# Patient Record
Sex: Female | Born: 1980 | Race: Black or African American | Hispanic: No | Marital: Married | Smoking: Never smoker
Health system: Southern US, Community
[De-identification: ages and names within clinical notes are randomized; demographics above are authoritative.]

## PROBLEM LIST (undated history)

## (undated) ENCOUNTER — Inpatient Hospital Stay (HOSPITAL_COMMUNITY): Payer: Self-pay

## (undated) DIAGNOSIS — G43909 Migraine, unspecified, not intractable, without status migrainosus: Secondary | ICD-10-CM

## (undated) DIAGNOSIS — D259 Leiomyoma of uterus, unspecified: Secondary | ICD-10-CM

## (undated) DIAGNOSIS — IMO0002 Reserved for concepts with insufficient information to code with codable children: Secondary | ICD-10-CM

## (undated) DIAGNOSIS — O3410 Maternal care for benign tumor of corpus uteri, unspecified trimester: Secondary | ICD-10-CM

## (undated) DIAGNOSIS — I2699 Other pulmonary embolism without acute cor pulmonale: Secondary | ICD-10-CM

## (undated) DIAGNOSIS — Z01818 Encounter for other preprocedural examination: Secondary | ICD-10-CM

## (undated) DIAGNOSIS — K8020 Calculus of gallbladder without cholecystitis without obstruction: Secondary | ICD-10-CM

## (undated) DIAGNOSIS — D649 Anemia, unspecified: Secondary | ICD-10-CM

## (undated) DIAGNOSIS — G35D Multiple sclerosis, unspecified: Secondary | ICD-10-CM

## (undated) HISTORY — PX: BLADDER SUSPENSION: SHX72

## (undated) HISTORY — PX: OTHER SURGICAL HISTORY: SHX169

## (undated) HISTORY — PX: TOTAL ABDOMINAL HYSTERECTOMY: SHX209

## (undated) HISTORY — PX: WISDOM TOOTH EXTRACTION: SHX21

## (undated) HISTORY — PX: TUBAL LIGATION: SHX77

## (undated) HISTORY — PX: ABLATION: SHX5711

## (undated) HISTORY — PX: DIAGNOSTIC LAPAROSCOPY: SUR761

## (undated) HISTORY — PX: UPPER GI ENDOSCOPY: SHX6162

## (undated) HISTORY — PX: CHOLECYSTECTOMY: SHX55

## (undated) HISTORY — PX: PELVIC LAPAROSCOPY: SHX162

## (undated) HISTORY — PX: MYOMECTOMY: SHX85

## (undated) HISTORY — DX: Migraine, unspecified, not intractable, without status migrainosus: G43.909

## (undated) HISTORY — PX: ABDOMINAL HYSTERECTOMY: SHX81

---
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
## 2002-11-05 ENCOUNTER — Emergency Department (HOSPITAL_COMMUNITY): Admission: EM | Admit: 2002-11-05 | Discharge: 2002-11-05 | Payer: Self-pay | Admitting: Emergency Medicine

## 2002-12-17 ENCOUNTER — Emergency Department (HOSPITAL_COMMUNITY): Admission: EM | Admit: 2002-12-17 | Discharge: 2002-12-17 | Payer: Self-pay | Admitting: Emergency Medicine

## 2002-12-17 ENCOUNTER — Encounter: Payer: Self-pay | Admitting: Emergency Medicine

## 2003-06-02 ENCOUNTER — Emergency Department (HOSPITAL_COMMUNITY): Admission: EM | Admit: 2003-06-02 | Discharge: 2003-06-02 | Payer: Self-pay | Admitting: Emergency Medicine

## 2003-12-25 ENCOUNTER — Other Ambulatory Visit: Admission: RE | Admit: 2003-12-25 | Discharge: 2003-12-25 | Payer: Self-pay | Admitting: Gynecology

## 2004-01-28 ENCOUNTER — Ambulatory Visit (HOSPITAL_COMMUNITY): Admission: RE | Admit: 2004-01-28 | Discharge: 2004-01-28 | Payer: Self-pay | Admitting: Gynecology

## 2004-01-28 ENCOUNTER — Ambulatory Visit (HOSPITAL_BASED_OUTPATIENT_CLINIC_OR_DEPARTMENT_OTHER): Admission: RE | Admit: 2004-01-28 | Discharge: 2004-01-28 | Payer: Self-pay | Admitting: Gynecology

## 2004-01-28 ENCOUNTER — Encounter (INDEPENDENT_AMBULATORY_CARE_PROVIDER_SITE_OTHER): Payer: Self-pay | Admitting: *Deleted

## 2004-05-05 ENCOUNTER — Ambulatory Visit (HOSPITAL_BASED_OUTPATIENT_CLINIC_OR_DEPARTMENT_OTHER): Admission: RE | Admit: 2004-05-05 | Discharge: 2004-05-05 | Payer: Self-pay | Admitting: Gynecology

## 2004-05-05 ENCOUNTER — Ambulatory Visit (HOSPITAL_COMMUNITY): Admission: RE | Admit: 2004-05-05 | Discharge: 2004-05-05 | Payer: Self-pay | Admitting: Gynecology

## 2004-05-05 ENCOUNTER — Encounter (INDEPENDENT_AMBULATORY_CARE_PROVIDER_SITE_OTHER): Payer: Self-pay | Admitting: *Deleted

## 2004-05-12 ENCOUNTER — Emergency Department (HOSPITAL_COMMUNITY): Admission: EM | Admit: 2004-05-12 | Discharge: 2004-05-12 | Payer: Self-pay | Admitting: *Deleted

## 2005-02-08 ENCOUNTER — Emergency Department (HOSPITAL_COMMUNITY): Admission: EM | Admit: 2005-02-08 | Discharge: 2005-02-09 | Payer: Self-pay | Admitting: Emergency Medicine

## 2005-06-30 ENCOUNTER — Other Ambulatory Visit: Admission: RE | Admit: 2005-06-30 | Discharge: 2005-06-30 | Payer: Self-pay | Admitting: Gynecology

## 2005-08-23 ENCOUNTER — Ambulatory Visit (HOSPITAL_BASED_OUTPATIENT_CLINIC_OR_DEPARTMENT_OTHER): Admission: RE | Admit: 2005-08-23 | Discharge: 2005-08-23 | Payer: Self-pay | Admitting: Gynecology

## 2005-08-23 ENCOUNTER — Encounter (INDEPENDENT_AMBULATORY_CARE_PROVIDER_SITE_OTHER): Payer: Self-pay | Admitting: Specialist

## 2005-09-30 ENCOUNTER — Emergency Department (HOSPITAL_COMMUNITY): Admission: EM | Admit: 2005-09-30 | Discharge: 2005-09-30 | Payer: Self-pay | Admitting: Emergency Medicine

## 2005-12-01 ENCOUNTER — Emergency Department (HOSPITAL_COMMUNITY): Admission: EM | Admit: 2005-12-01 | Discharge: 2005-12-02 | Payer: Self-pay | Admitting: Emergency Medicine

## 2006-04-25 ENCOUNTER — Encounter (INDEPENDENT_AMBULATORY_CARE_PROVIDER_SITE_OTHER): Payer: Self-pay | Admitting: Family Medicine

## 2006-04-25 ENCOUNTER — Other Ambulatory Visit: Admission: RE | Admit: 2006-04-25 | Discharge: 2006-04-25 | Payer: Self-pay | Admitting: Gynecology

## 2006-04-25 LAB — CONVERTED CEMR LAB: Pap Smear: NORMAL

## 2006-10-03 ENCOUNTER — Emergency Department (HOSPITAL_COMMUNITY): Admission: EM | Admit: 2006-10-03 | Discharge: 2006-10-03 | Payer: Self-pay | Admitting: Emergency Medicine

## 2006-12-30 ENCOUNTER — Ambulatory Visit: Payer: Self-pay | Admitting: Family Medicine

## 2006-12-31 ENCOUNTER — Encounter (INDEPENDENT_AMBULATORY_CARE_PROVIDER_SITE_OTHER): Payer: Self-pay | Admitting: Family Medicine

## 2006-12-31 LAB — CONVERTED CEMR LAB: TSH: 1.429 microintl units/mL

## 2007-01-02 ENCOUNTER — Ambulatory Visit (HOSPITAL_COMMUNITY): Admission: RE | Admit: 2007-01-02 | Discharge: 2007-01-02 | Payer: Self-pay | Admitting: Family Medicine

## 2007-01-02 ENCOUNTER — Ambulatory Visit: Payer: Self-pay | Admitting: *Deleted

## 2007-04-17 ENCOUNTER — Emergency Department (HOSPITAL_COMMUNITY): Admission: EM | Admit: 2007-04-17 | Discharge: 2007-04-18 | Payer: Self-pay | Admitting: Emergency Medicine

## 2007-05-17 ENCOUNTER — Ambulatory Visit: Payer: Self-pay | Admitting: Family Medicine

## 2007-05-30 ENCOUNTER — Encounter (INDEPENDENT_AMBULATORY_CARE_PROVIDER_SITE_OTHER): Payer: Self-pay | Admitting: Family Medicine

## 2007-05-30 DIAGNOSIS — N809 Endometriosis, unspecified: Secondary | ICD-10-CM | POA: Insufficient documentation

## 2007-05-30 DIAGNOSIS — D259 Leiomyoma of uterus, unspecified: Secondary | ICD-10-CM | POA: Insufficient documentation

## 2007-06-01 ENCOUNTER — Ambulatory Visit (HOSPITAL_COMMUNITY): Admission: RE | Admit: 2007-06-01 | Discharge: 2007-06-01 | Payer: Self-pay | Admitting: Internal Medicine

## 2007-12-28 ENCOUNTER — Emergency Department (HOSPITAL_COMMUNITY): Admission: EM | Admit: 2007-12-28 | Discharge: 2007-12-28 | Payer: Self-pay | Admitting: Emergency Medicine

## 2008-04-22 ENCOUNTER — Emergency Department (HOSPITAL_COMMUNITY): Admission: EM | Admit: 2008-04-22 | Discharge: 2008-04-22 | Payer: Self-pay | Admitting: Emergency Medicine

## 2008-06-14 ENCOUNTER — Inpatient Hospital Stay (HOSPITAL_COMMUNITY): Admission: AD | Admit: 2008-06-14 | Discharge: 2008-06-14 | Payer: Self-pay | Admitting: Obstetrics & Gynecology

## 2008-06-14 ENCOUNTER — Ambulatory Visit (HOSPITAL_COMMUNITY): Admission: RE | Admit: 2008-06-14 | Discharge: 2008-06-14 | Payer: Self-pay | Admitting: Obstetrics & Gynecology

## 2008-07-15 ENCOUNTER — Ambulatory Visit (HOSPITAL_COMMUNITY): Admission: RE | Admit: 2008-07-15 | Discharge: 2008-07-15 | Payer: Self-pay | Admitting: Family Medicine

## 2008-08-02 ENCOUNTER — Inpatient Hospital Stay (HOSPITAL_COMMUNITY): Admission: AD | Admit: 2008-08-02 | Discharge: 2008-08-02 | Payer: Self-pay | Admitting: Obstetrics & Gynecology

## 2008-08-02 ENCOUNTER — Ambulatory Visit: Payer: Self-pay | Admitting: Obstetrics and Gynecology

## 2008-08-07 ENCOUNTER — Ambulatory Visit (HOSPITAL_COMMUNITY): Admission: RE | Admit: 2008-08-07 | Discharge: 2008-08-07 | Payer: Self-pay | Admitting: Obstetrics & Gynecology

## 2008-08-12 ENCOUNTER — Ambulatory Visit: Payer: Self-pay | Admitting: Family Medicine

## 2008-08-22 ENCOUNTER — Inpatient Hospital Stay (HOSPITAL_COMMUNITY): Admission: AD | Admit: 2008-08-22 | Discharge: 2008-08-22 | Payer: Self-pay | Admitting: Family Medicine

## 2008-08-23 ENCOUNTER — Inpatient Hospital Stay (HOSPITAL_COMMUNITY): Admission: AD | Admit: 2008-08-23 | Discharge: 2008-08-25 | Payer: Self-pay | Admitting: Obstetrics & Gynecology

## 2008-08-23 ENCOUNTER — Ambulatory Visit: Payer: Self-pay | Admitting: Obstetrics and Gynecology

## 2008-08-26 ENCOUNTER — Ambulatory Visit: Payer: Self-pay | Admitting: Obstetrics & Gynecology

## 2008-08-28 ENCOUNTER — Ambulatory Visit: Payer: Self-pay | Admitting: Family Medicine

## 2008-08-28 ENCOUNTER — Inpatient Hospital Stay (HOSPITAL_COMMUNITY): Admission: AD | Admit: 2008-08-28 | Discharge: 2008-08-29 | Payer: Self-pay | Admitting: Obstetrics & Gynecology

## 2008-09-02 ENCOUNTER — Ambulatory Visit (HOSPITAL_COMMUNITY): Admission: RE | Admit: 2008-09-02 | Discharge: 2008-09-02 | Payer: Self-pay | Admitting: Family Medicine

## 2008-09-02 ENCOUNTER — Ambulatory Visit: Payer: Self-pay | Admitting: Obstetrics & Gynecology

## 2008-09-03 ENCOUNTER — Ambulatory Visit: Payer: Self-pay | Admitting: Obstetrics & Gynecology

## 2008-09-03 LAB — CONVERTED CEMR LAB
Collection Interval-CRCL: 24 hr
Protein, Ur: 84 mg/24hr (ref 50–100)

## 2008-09-09 ENCOUNTER — Encounter: Payer: Self-pay | Admitting: Obstetrics & Gynecology

## 2008-09-09 ENCOUNTER — Ambulatory Visit: Payer: Self-pay | Admitting: Obstetrics & Gynecology

## 2008-09-11 ENCOUNTER — Inpatient Hospital Stay (HOSPITAL_COMMUNITY): Admission: AD | Admit: 2008-09-11 | Discharge: 2008-09-11 | Payer: Self-pay | Admitting: Family Medicine

## 2008-09-11 ENCOUNTER — Ambulatory Visit: Payer: Self-pay | Admitting: Obstetrics and Gynecology

## 2008-09-16 ENCOUNTER — Encounter: Payer: Self-pay | Admitting: Obstetrics & Gynecology

## 2008-09-16 ENCOUNTER — Ambulatory Visit: Payer: Self-pay | Admitting: Family Medicine

## 2008-09-16 LAB — CONVERTED CEMR LAB
HCT: 35.3 % — ABNORMAL LOW (ref 36.0–46.0)
Hemoglobin: 11.2 g/dL — ABNORMAL LOW (ref 12.0–15.0)
MCV: 90.7 fL (ref 78.0–100.0)
RDW: 13.9 % (ref 11.5–15.5)

## 2008-09-23 ENCOUNTER — Ambulatory Visit: Payer: Self-pay | Admitting: Obstetrics & Gynecology

## 2008-09-24 ENCOUNTER — Ambulatory Visit: Payer: Self-pay | Admitting: Family Medicine

## 2008-09-24 ENCOUNTER — Inpatient Hospital Stay (HOSPITAL_COMMUNITY): Admission: AD | Admit: 2008-09-24 | Discharge: 2008-09-24 | Payer: Self-pay | Admitting: Family Medicine

## 2008-09-26 ENCOUNTER — Encounter: Payer: Self-pay | Admitting: Obstetrics & Gynecology

## 2008-09-27 ENCOUNTER — Inpatient Hospital Stay (HOSPITAL_COMMUNITY): Admission: AD | Admit: 2008-09-27 | Discharge: 2008-09-29 | Payer: Self-pay | Admitting: Obstetrics and Gynecology

## 2008-09-27 ENCOUNTER — Ambulatory Visit: Payer: Self-pay | Admitting: Obstetrics and Gynecology

## 2008-09-30 ENCOUNTER — Inpatient Hospital Stay (HOSPITAL_COMMUNITY): Admission: AD | Admit: 2008-09-30 | Discharge: 2008-10-01 | Payer: Self-pay | Admitting: Obstetrics & Gynecology

## 2008-09-30 ENCOUNTER — Ambulatory Visit: Payer: Self-pay | Admitting: Obstetrics & Gynecology

## 2008-10-07 ENCOUNTER — Ambulatory Visit: Payer: Self-pay | Admitting: Obstetrics & Gynecology

## 2008-10-14 ENCOUNTER — Encounter: Payer: Self-pay | Admitting: Family

## 2008-10-14 ENCOUNTER — Ambulatory Visit: Payer: Self-pay | Admitting: Obstetrics & Gynecology

## 2008-10-21 ENCOUNTER — Ambulatory Visit: Payer: Self-pay | Admitting: Obstetrics & Gynecology

## 2008-10-28 ENCOUNTER — Ambulatory Visit: Payer: Self-pay | Admitting: Obstetrics & Gynecology

## 2008-10-28 ENCOUNTER — Encounter: Payer: Self-pay | Admitting: Family

## 2008-10-31 ENCOUNTER — Inpatient Hospital Stay (HOSPITAL_COMMUNITY): Admission: AD | Admit: 2008-10-31 | Discharge: 2008-11-02 | Payer: Self-pay | Admitting: Obstetrics & Gynecology

## 2008-11-01 ENCOUNTER — Ambulatory Visit: Payer: Self-pay | Admitting: Family Medicine

## 2008-11-04 ENCOUNTER — Ambulatory Visit: Payer: Self-pay | Admitting: Obstetrics & Gynecology

## 2008-11-04 ENCOUNTER — Encounter: Payer: Self-pay | Admitting: Family Medicine

## 2008-11-07 ENCOUNTER — Ambulatory Visit: Payer: Self-pay | Admitting: Obstetrics & Gynecology

## 2008-11-11 ENCOUNTER — Ambulatory Visit: Payer: Self-pay | Admitting: Obstetrics & Gynecology

## 2008-11-13 ENCOUNTER — Ambulatory Visit (HOSPITAL_COMMUNITY): Admission: RE | Admit: 2008-11-13 | Discharge: 2008-11-13 | Payer: Self-pay | Admitting: Family Medicine

## 2008-11-14 ENCOUNTER — Ambulatory Visit: Payer: Self-pay | Admitting: Family Medicine

## 2008-11-18 ENCOUNTER — Ambulatory Visit: Payer: Self-pay | Admitting: Obstetrics & Gynecology

## 2008-11-18 LAB — CONVERTED CEMR LAB
Chlamydia, DNA Probe: NEGATIVE
GC Probe Amp, Genital: NEGATIVE

## 2008-11-19 ENCOUNTER — Encounter: Payer: Self-pay | Admitting: Obstetrics & Gynecology

## 2008-11-19 LAB — CONVERTED CEMR LAB
Clue Cells Wet Prep HPF POC: NONE SEEN
Trich, Wet Prep: NONE SEEN

## 2008-11-21 ENCOUNTER — Ambulatory Visit: Payer: Self-pay | Admitting: Obstetrics & Gynecology

## 2008-11-25 ENCOUNTER — Ambulatory Visit: Payer: Self-pay | Admitting: Family Medicine

## 2008-11-28 ENCOUNTER — Ambulatory Visit: Payer: Self-pay | Admitting: Obstetrics & Gynecology

## 2008-12-02 ENCOUNTER — Encounter: Payer: Self-pay | Admitting: Obstetrics & Gynecology

## 2008-12-02 ENCOUNTER — Ambulatory Visit: Payer: Self-pay | Admitting: Obstetrics & Gynecology

## 2008-12-05 ENCOUNTER — Ambulatory Visit: Payer: Self-pay | Admitting: Family Medicine

## 2008-12-09 ENCOUNTER — Ambulatory Visit: Admission: RE | Admit: 2008-12-09 | Discharge: 2008-12-09 | Payer: Self-pay | Admitting: Obstetrics & Gynecology

## 2008-12-09 ENCOUNTER — Ambulatory Visit: Payer: Self-pay | Admitting: Obstetrics & Gynecology

## 2008-12-10 ENCOUNTER — Inpatient Hospital Stay (HOSPITAL_COMMUNITY): Admission: AD | Admit: 2008-12-10 | Discharge: 2008-12-13 | Payer: Self-pay | Admitting: Obstetrics & Gynecology

## 2008-12-10 ENCOUNTER — Ambulatory Visit: Payer: Self-pay | Admitting: Advanced Practice Midwife

## 2009-04-02 ENCOUNTER — Ambulatory Visit: Payer: Self-pay | Admitting: Obstetrics and Gynecology

## 2009-04-02 ENCOUNTER — Encounter: Payer: Self-pay | Admitting: Obstetrics & Gynecology

## 2009-04-02 LAB — CONVERTED CEMR LAB
Hemoglobin: 9.9 g/dL — ABNORMAL LOW (ref 12.0–15.0)
Platelets: 397 10*3/uL (ref 150–400)
RDW: 14 % (ref 11.5–15.5)

## 2009-04-30 ENCOUNTER — Ambulatory Visit: Payer: Self-pay | Admitting: Obstetrics and Gynecology

## 2009-05-05 ENCOUNTER — Emergency Department (HOSPITAL_BASED_OUTPATIENT_CLINIC_OR_DEPARTMENT_OTHER): Admission: EM | Admit: 2009-05-05 | Discharge: 2009-05-05 | Payer: Self-pay | Admitting: Emergency Medicine

## 2009-05-05 ENCOUNTER — Ambulatory Visit: Payer: Self-pay | Admitting: Diagnostic Radiology

## 2009-05-06 ENCOUNTER — Ambulatory Visit (HOSPITAL_COMMUNITY): Admission: RE | Admit: 2009-05-06 | Discharge: 2009-05-06 | Payer: Self-pay | Admitting: Obstetrics & Gynecology

## 2009-07-23 ENCOUNTER — Ambulatory Visit: Payer: Self-pay | Admitting: Obstetrics and Gynecology

## 2009-08-07 ENCOUNTER — Ambulatory Visit: Payer: Self-pay | Admitting: Obstetrics and Gynecology

## 2009-10-20 ENCOUNTER — Ambulatory Visit: Payer: Self-pay | Admitting: Obstetrics and Gynecology

## 2009-10-20 ENCOUNTER — Inpatient Hospital Stay (HOSPITAL_COMMUNITY): Admission: RE | Admit: 2009-10-20 | Discharge: 2009-10-21 | Payer: Self-pay | Admitting: Obstetrics and Gynecology

## 2009-10-20 ENCOUNTER — Encounter: Payer: Self-pay | Admitting: Obstetrics and Gynecology

## 2009-10-23 ENCOUNTER — Ambulatory Visit: Payer: Self-pay | Admitting: Obstetrics & Gynecology

## 2009-12-09 ENCOUNTER — Emergency Department (HOSPITAL_BASED_OUTPATIENT_CLINIC_OR_DEPARTMENT_OTHER): Admission: EM | Admit: 2009-12-09 | Discharge: 2009-12-09 | Payer: Self-pay | Admitting: Emergency Medicine

## 2009-12-09 ENCOUNTER — Ambulatory Visit: Payer: Self-pay | Admitting: Diagnostic Radiology

## 2010-01-21 ENCOUNTER — Ambulatory Visit: Payer: Self-pay | Admitting: Nurse Practitioner

## 2010-01-21 ENCOUNTER — Inpatient Hospital Stay (HOSPITAL_COMMUNITY): Admission: AD | Admit: 2010-01-21 | Discharge: 2010-01-22 | Payer: Self-pay | Admitting: Obstetrics and Gynecology

## 2010-02-12 ENCOUNTER — Ambulatory Visit: Payer: Self-pay | Admitting: Obstetrics and Gynecology

## 2010-02-12 ENCOUNTER — Encounter (INDEPENDENT_AMBULATORY_CARE_PROVIDER_SITE_OTHER): Payer: Self-pay | Admitting: *Deleted

## 2010-02-12 LAB — CONVERTED CEMR LAB
FSH: 6.3 milliintl units/mL
Prolactin: 7.6 ng/mL

## 2010-02-19 ENCOUNTER — Ambulatory Visit (HOSPITAL_COMMUNITY): Admission: RE | Admit: 2010-02-19 | Discharge: 2010-02-19 | Payer: Self-pay | Admitting: Obstetrics & Gynecology

## 2010-02-26 ENCOUNTER — Ambulatory Visit: Payer: Self-pay | Admitting: Obstetrics and Gynecology

## 2010-04-09 ENCOUNTER — Ambulatory Visit (HOSPITAL_COMMUNITY): Admission: RE | Admit: 2010-04-09 | Discharge: 2010-04-09 | Payer: Self-pay | Admitting: Family Medicine

## 2010-09-27 ENCOUNTER — Inpatient Hospital Stay (HOSPITAL_COMMUNITY)
Admission: AD | Admit: 2010-09-27 | Discharge: 2010-09-27 | Disposition: A | Discharge status: Home / Self Care | Payer: Medicaid Other | Source: Ambulatory Visit | Attending: Obstetrics and Gynecology | Admitting: Obstetrics and Gynecology

## 2010-09-27 DIAGNOSIS — R079 Chest pain, unspecified: Secondary | ICD-10-CM

## 2010-09-27 DIAGNOSIS — O9989 Other specified diseases and conditions complicating pregnancy, childbirth and the puerperium: Secondary | ICD-10-CM

## 2010-09-27 DIAGNOSIS — O99891 Other specified diseases and conditions complicating pregnancy: Secondary | ICD-10-CM | POA: Insufficient documentation

## 2010-09-27 LAB — CBC
HCT: 28.1 % — ABNORMAL LOW (ref 36.0–46.0)
MCV: 78.3 fL (ref 78.0–100.0)
RBC: 3.59 MIL/uL — ABNORMAL LOW (ref 3.87–5.11)
WBC: 9 10*3/uL (ref 4.0–10.5)

## 2010-09-27 LAB — URINALYSIS, ROUTINE W REFLEX MICROSCOPIC
Bilirubin Urine: NEGATIVE
Ketones, ur: 40 mg/dL — AB
Nitrite: NEGATIVE
Urobilinogen, UA: 0.2 mg/dL (ref 0.0–1.0)

## 2010-09-27 LAB — COMPREHENSIVE METABOLIC PANEL
Albumin: 3.2 g/dL — ABNORMAL LOW (ref 3.5–5.2)
Alkaline Phosphatase: 41 U/L (ref 39–117)
BUN: 4 mg/dL — ABNORMAL LOW (ref 6–23)
Chloride: 106 mEq/L (ref 96–112)
Glucose, Bld: 78 mg/dL (ref 70–99)
Potassium: 3.9 mEq/L (ref 3.5–5.1)
Total Bilirubin: 0.5 mg/dL (ref 0.3–1.2)

## 2010-09-29 LAB — URINE CULTURE

## 2010-10-19 LAB — CBC
HCT: 25.2 % — ABNORMAL LOW (ref 36.0–46.0)
Hemoglobin: 8.3 g/dL — ABNORMAL LOW (ref 12.0–15.0)
MCV: 82.3 fL (ref 78.0–100.0)
RBC: 3.02 MIL/uL — ABNORMAL LOW (ref 3.87–5.11)
RBC: 3.86 MIL/uL — ABNORMAL LOW (ref 3.87–5.11)
RDW: 16.2 % — ABNORMAL HIGH (ref 11.5–15.5)
WBC: 4.3 10*3/uL (ref 4.0–10.5)
WBC: 7.9 10*3/uL (ref 4.0–10.5)

## 2010-10-19 LAB — PREGNANCY, URINE: Preg Test, Ur: NEGATIVE

## 2010-10-30 LAB — POCT URINALYSIS DIP (DEVICE)
Hgb urine dipstick: NEGATIVE
Ketones, ur: NEGATIVE mg/dL
Protein, ur: NEGATIVE mg/dL
Specific Gravity, Urine: <=1.005 (ref 1.005–1.030)
pH: 6 (ref 5.0–8.0)

## 2010-11-03 LAB — POCT URINALYSIS DIP (DEVICE)
Hgb urine dipstick: NEGATIVE
Hgb urine dipstick: NEGATIVE
Nitrite: NEGATIVE
Protein, ur: NEGATIVE mg/dL
Protein, ur: NEGATIVE mg/dL
Specific Gravity, Urine: 1.015 (ref 1.005–1.030)
Specific Gravity, Urine: <=1.005 (ref 1.005–1.030)
Urobilinogen, UA: 0.2 mg/dL (ref 0.0–1.0)
Urobilinogen, UA: 0.2 mg/dL (ref 0.0–1.0)
Urobilinogen, UA: 0.2 mg/dL (ref 0.0–1.0)
pH: 7 (ref 5.0–8.0)
pH: 7 (ref 5.0–8.0)

## 2010-11-03 LAB — CBC
HCT: 30.4 % — ABNORMAL LOW (ref 36.0–46.0)
Hemoglobin: 10.6 g/dL — ABNORMAL LOW (ref 12.0–15.0)
MCHC: 33.6 g/dL (ref 30.0–36.0)
MCHC: 33.9 g/dL (ref 30.0–36.0)
MCV: 89.3 fL (ref 78.0–100.0)
MCV: 89.5 fL (ref 78.0–100.0)
Platelets: 190 10*3/uL (ref 150–400)
RBC: 3.51 MIL/uL — ABNORMAL LOW (ref 3.87–5.11)

## 2010-11-03 LAB — CROSSMATCH: ABO/RH(D): O POS

## 2010-11-03 LAB — DIFFERENTIAL
Basophils Relative: 1 % (ref 0–1)
Eosinophils Absolute: 0.1 10*3/uL (ref 0.0–0.7)
Eosinophils Relative: 2 % (ref 0–5)
Neutrophils Relative %: 59 % (ref 43–77)

## 2010-11-04 LAB — POCT URINALYSIS DIP (DEVICE)
Bilirubin Urine: NEGATIVE
Glucose, UA: NEGATIVE mg/dL
Hgb urine dipstick: NEGATIVE
Nitrite: NEGATIVE
Nitrite: NEGATIVE
Nitrite: NEGATIVE
Protein, ur: NEGATIVE mg/dL
Protein, ur: NEGATIVE mg/dL
Specific Gravity, Urine: 1.02 (ref 1.005–1.030)
Urobilinogen, UA: 0.2 mg/dL (ref 0.0–1.0)
Urobilinogen, UA: 0.2 mg/dL (ref 0.0–1.0)
Urobilinogen, UA: 1 mg/dL (ref 0.0–1.0)
pH: 7 (ref 5.0–8.0)
pH: 6.5 (ref 5.0–8.0)

## 2010-11-05 LAB — POCT URINALYSIS DIP (DEVICE)
Bilirubin Urine: NEGATIVE
Bilirubin Urine: NEGATIVE
Glucose, UA: NEGATIVE mg/dL
Ketones, ur: >=160 mg/dL — AB
Nitrite: NEGATIVE
Nitrite: NEGATIVE
Nitrite: NEGATIVE
Nitrite: NEGATIVE
Protein, ur: 100 mg/dL — AB
Protein, ur: 30 mg/dL — AB
Protein, ur: 30 mg/dL — AB
Protein, ur: NEGATIVE mg/dL
Specific Gravity, Urine: 1.015 (ref 1.005–1.030)
Specific Gravity, Urine: 1.02 (ref 1.005–1.030)
Urobilinogen, UA: 0.2 mg/dL (ref 0.0–1.0)
Urobilinogen, UA: 1 mg/dL (ref 0.0–1.0)
Urobilinogen, UA: 0.2 mg/dL (ref 0.0–1.0)
Urobilinogen, UA: 1 mg/dL (ref 0.0–1.0)
pH: 6 (ref 5.0–8.0)
pH: 6.5 (ref 5.0–8.0)
pH: 7.5 (ref 5.0–8.0)

## 2010-11-05 LAB — URINALYSIS, ROUTINE W REFLEX MICROSCOPIC
Bilirubin Urine: NEGATIVE
Ketones, ur: NEGATIVE mg/dL
Ketones, ur: NEGATIVE mg/dL
Nitrite: NEGATIVE
Protein, ur: NEGATIVE mg/dL
Protein, ur: NEGATIVE mg/dL
Urobilinogen, UA: 0.2 mg/dL (ref 0.0–1.0)

## 2010-11-05 LAB — CBC
HCT: 33.9 % — ABNORMAL LOW (ref 36.0–46.0)
HCT: 36.6 % (ref 36.0–46.0)
Hemoglobin: 11.4 g/dL — ABNORMAL LOW (ref 12.0–15.0)
Hemoglobin: 11.9 g/dL — ABNORMAL LOW (ref 12.0–15.0)
MCHC: 32.6 g/dL (ref 30.0–36.0)
MCHC: 33.5 g/dL (ref 30.0–36.0)
MCV: 92 fL (ref 78.0–100.0)
MCV: 92.6 fL (ref 78.0–100.0)
Platelets: 218 10*3/uL (ref 150–400)
Platelets: 223 10*3/uL (ref 150–400)
RBC: 3.68 MIL/uL — ABNORMAL LOW (ref 3.87–5.11)
RBC: 3.95 MIL/uL (ref 3.87–5.11)
RDW: 13.6 % (ref 11.5–15.5)
RDW: 13.8 % (ref 11.5–15.5)
WBC: 10.4 10*3/uL (ref 4.0–10.5)
WBC: 9.7 10*3/uL (ref 4.0–10.5)

## 2010-11-05 LAB — URINE CULTURE
Colony Count: NO GROWTH
Culture: NO GROWTH

## 2010-11-05 LAB — WET PREP, GENITAL
Trich, Wet Prep: NONE SEEN
Yeast Wet Prep HPF POC: NONE SEEN

## 2010-11-05 LAB — URINE MICROSCOPIC-ADD ON

## 2010-11-09 LAB — POCT URINALYSIS DIP (DEVICE)
Glucose, UA: NEGATIVE mg/dL
Hgb urine dipstick: NEGATIVE
Specific Gravity, Urine: 1.025 (ref 1.005–1.030)
Urobilinogen, UA: 0.2 mg/dL (ref 0.0–1.0)
pH: 5.5 (ref 5.0–8.0)

## 2010-11-09 LAB — STREP B DNA PROBE: Strep Group B Ag: POSITIVE

## 2010-11-09 LAB — URINALYSIS, ROUTINE W REFLEX MICROSCOPIC
Bilirubin Urine: NEGATIVE
Bilirubin Urine: NEGATIVE
Glucose, UA: NEGATIVE mg/dL
Glucose, UA: NEGATIVE mg/dL
Hgb urine dipstick: NEGATIVE
Hgb urine dipstick: NEGATIVE
Protein, ur: NEGATIVE mg/dL
Protein, ur: NEGATIVE mg/dL
Specific Gravity, Urine: <1.005 — ABNORMAL LOW (ref 1.005–1.030)
Urobilinogen, UA: 0.2 mg/dL (ref 0.0–1.0)

## 2010-11-09 LAB — URINE MICROSCOPIC-ADD ON

## 2010-11-10 LAB — POCT URINALYSIS DIP (DEVICE)
Glucose, UA: NEGATIVE mg/dL
Glucose, UA: NEGATIVE mg/dL
Hgb urine dipstick: NEGATIVE
Nitrite: NEGATIVE
Nitrite: NEGATIVE
Nitrite: NEGATIVE
Nitrite: POSITIVE — AB
Protein, ur: >=300 mg/dL — AB
Protein, ur: NEGATIVE mg/dL
Urobilinogen, UA: 0.2 mg/dL (ref 0.0–1.0)
Urobilinogen, UA: 0.2 mg/dL (ref 0.0–1.0)
Urobilinogen, UA: 4 mg/dL — ABNORMAL HIGH (ref 0.0–1.0)
pH: 8.5 — ABNORMAL HIGH (ref 5.0–8.0)

## 2010-11-10 LAB — URINALYSIS, ROUTINE W REFLEX MICROSCOPIC
Hgb urine dipstick: NEGATIVE
Hgb urine dipstick: NEGATIVE
Nitrite: NEGATIVE
Nitrite: NEGATIVE
Protein, ur: NEGATIVE mg/dL
Protein, ur: NEGATIVE mg/dL
Specific Gravity, Urine: 1.01 (ref 1.005–1.030)
Urobilinogen, UA: 0.2 mg/dL (ref 0.0–1.0)
Urobilinogen, UA: 0.2 mg/dL (ref 0.0–1.0)

## 2010-11-10 LAB — URINE CULTURE

## 2010-11-10 LAB — WET PREP, GENITAL
Clue Cells Wet Prep HPF POC: NONE SEEN
Trich, Wet Prep: NONE SEEN

## 2010-11-10 LAB — URINE MICROSCOPIC-ADD ON

## 2010-12-08 NOTE — Discharge Summary (Signed)
NAME:  Leslie Owen, Leslie Owen            ACCOUNT NO.:  [id number]   MEDICAL RECORD NO.:  [id number]          PATIENT TYPE:  INP   LOCATION:  9158                          FACILITY:  [facility]   PHYSICIAN:  Scheryl Darter, MD       DATE OF BIRTH:  03-Jan-1981   DATE OF ADMISSION:  08/23/2008  DATE OF DISCHARGE:  08/25/2008                               DISCHARGE SUMMARY   CHIEF COMPLAINT:  Pains.   DIAGNOSES:  Intrauterine pregnancy at 24 weeks' 1 day gestation with  fibroid uterus and threatened preterm labor.   The patient is a 30 year old black female gravida 1, para 0.  Last  menstrual period on March 09, 2008.  Estimated date of confinement Dec 16, 2008, who was admitted on August 23, 2008, with complaints of lower  abdominal pains and she was found to have dilated about 1 cm.  The  patient had [facility] due to fibroid uterus.  She has started  Procardia a day before and then she was admitted for observation.  She  had seen some scanty dark red vaginal bleeding on August 22, 2008.   PAST MEDICAL HISTORY:  Endometriosis, fibroids, gastric ulcer, and  ovarian cyst.   PAST SURGICAL HISTORY:  Upper endoscopy, extraction of wisdom teeth, 2  laparoscopies for endometriosis.   SOCIAL HISTORY:  The patient is a nonsmoker and denies alcohol or drug  use.   PHYSICAL EXAMINATION:  VITAL SIGNS:  Stable.  GENERAL:  The patient is afebrile.  She is in no acute distress.  CHEST:  Clear.  HEART:  Regular rate and rhythm.  ABDOMEN:  Gravid and nontender.  CERVICAL:  It was uneffaced 1 cm, foot palpable above the internal os.  Ultrasound showed normal placenta, cervix is 4.7 cm long.  The patient  was admitted and she continued taking Procardia XL 60 mg twice a day and  ibuprofen 800 mg q.8 h. was scheduled 9 doses.  She received  betamethasone 12 mg IM 2 doses.  She had fetal monitoring which showed  normal fetal heart rate and uterine irritability.  The patient had  episode of elevated  heart rate of 120s to 130s with normal EKG.  She  also had 2 episodes of nausea but she had no complaints of diarrhea or  reflux symptoms.   IMPRESSION:  1. Intrauterine pregnancy at 24 weeks' 1 day gestation with fibroid      uterus and threatened preterm labor.  2. History of peptic ulcer.   PLAN:  The patient to be discharged home with preterm labor precautions.  She will continue taking Procardia XL 60 mg twice a day.  She is to  finish 9 doses of Motrin 800 mg q.8 h.  Because of history of ulcers and  her symptoms of nausea while hospitalized, started on Pepcid 40 mg a  day.  She has appointment at [facility] on August 26, 2008.      Scheryl Darter, MD     JA/MEDQ  D:  08/25/2008  T:  08/26/2008  Job:  [id number]

## 2010-12-08 NOTE — Discharge Summary (Signed)
NAME:  Leslie Owen, Leslie Owen NO.:  [id number]   MEDICAL RECORD NO.:  [id number]         PATIENT TYPE:  WOBV   LOCATION:                                [facility]ILITY:  [facility]   PHYSICIAN:  Scheryl Darter, MD       DATE OF BIRTH:  07/03/1981   DATE OF ADMISSION:  10/31/2008  DATE OF DISCHARGE:  11/02/2008                               DISCHARGE SUMMARY   ADMITTING DIAGNOSES:  1. Intrauterine pregnancy at 72 and 2/7th weeks.  2. Status post fall.  3. Preterm contractions.  4. Known polyhydramnios.  5. Known fibroid uterus.   DISCHARGE DIAGNOSES:  1. Intrauterine pregnancy at 44 and 2/7th weeks.  2. Status post fall.  3. Preterm contractions.  4. Known polyhydramnios.  5. Known fibroid uterus.   HOSPITAL COURSE:  [name] is a 30 year old primigravida at 32 and  2/7th weeks, who came in to [facility] status post fall at 7 p.m.  on October 31, 2008.  She is a patient of the [facility], and her  pregnancy has been remarkable for:  1. Fibroid uterus.  2. Preterm contractions.  3. History of endometriosis.  4. Peptic ulcer.  5. Polyhydramnios.   The patient fell on her knees and her right hip.  She denied bleeding or  leaking.  Upon arrival, her vital signs were stable.  Fetal heart rate  was reactive.  She was having irritability at first with contractions on  the monitor.  Ultrasound showed normal placenta with no hematoma,  positive fibroids and cervix 2.2 cm and most recent ultrasound on October 21, 2008, showed cervix 1.29 cm, so that is not a new finding.  AFI was  29.8.  Last cervix exam in the office had been 1 and 50%, cervix was 1  and 50% upon arrival to [facility].  The patient has been taking  Procardia XL 60 mg b.i.d. for preterm contractions and shortened cervix.  It was determined that the patient could benefit from outpatient  observation.  On November 01, 2008, which was hospital day 1, the patient  was stable during the day and on November 02, 2008, she continued to do  well, fetal heart rate remained reactive, occasional contractions with  some uterine irritability were still noted on the monitor.  Cervix  remained 1, 50%, vertex -3 and the patient was deemed to have received  the full benefit of her observation and was discharged home.   DISCHARGE MEDICATIONS:  1. Procardia XL 60 mg b.i.d.  2. Prenatal vitamin 1 p.o. daily.   DISCHARGE INSTRUCTIONS:  Preterm labor precautions were given.   DISCHARGE FOLLOWUP:  Appointment at [facility] on November 04, 2008.      Cam Hai, C.N.M.      Scheryl Darter, MD  Electronically Signed    KS/MEDQ  D:  11/02/2008  T:  11/03/2008  Job:  [id number]

## 2010-12-08 NOTE — Discharge Summary (Signed)
NAME:  Leslie Owen, Leslie Owen NO.:  [id number]   MEDICAL RECORD NO.:  [id number]         PATIENT TYPE:  WINP   LOCATION:                                [facility]  [facility]   PHYSICIAN:  Scheryl Darter, MD       DATE OF BIRTH:  [birth year]   DATE OF ADMISSION:  09/23/2008  DATE OF DISCHARGE:  09/29/2008                               DISCHARGE SUMMARY   DIAGNOSES:  1. Intrauterine pregnancy, preterm labor, and fibroid uterus.  2. Urinary tract infection.  3. Bacterial vaginosis.   HISTORY:  The patient is a 30 year old white female gravid 1, para 0,  admitted at 28 weeks 6 days gestation with symptoms of preterm labor.  She has a history of fibroid uterus.  She was treated for urinary tract  infection.   PAST SURGICAL HISTORY:  Upper endoscopy, wisdom tooth extraction,  laparoscopy on 2 occasions for endometriosis.   SOCIAL HISTORY:  The patient is a nonsmoker and denies alcohol or drug  use.   FAMILY HISTORY:  Brain tumor.   ALLERGIES:  No known drug allergies.   MEDICATIONS ON ADMISSION:  1. Prenatal vitamin one a day.  2. Ibuprofen 200 mg daily.  3. Procardia 60 mg twice a day.  4. Keflex 500 mg 4 times a day.  5. Dilaudid 2 mg q.4 h. p.r.n. pain.   PHYSICAL EXAMINATION:  ABDOMEN:  The patient's abdomen was gravid, no  guarding, some mild tenderness.  PELVIC:  Small amount of blood, but thick cervical mucus.  Cervix was  50% effaced, 1 cm dilated, -3 station.  Fetal heart rate 150s.   She presented with regular contractions and she was admitted for  tocolysis with magnesium and placed on bed rest with fetal monitoring.   Diagnosis is made of bacterial vaginosis and she was started on Flagyl.  Due to positive group B strep, she received IV penicillin.  The patient  had betamethasone injections on previous hospitalization.  Ultrasound  showed a cervical length of 4 mm.  Followup pelvic exam on September 29, 2008, showed a cervical length of 50% effacement and 1 cm  dilated, -2  station.  At that time, her magnesium sulfate had been stopped after 12  hours she said that she had just irregular contractions.  The patient  was discharged home on September 29, 2008, and was to continue her Keflex,  Procardia, ibuprofen, prenatal vitamin, and Dilaudid as needed.  I also  gave her prescription for Flagyl 500 mg b.i.d. to complete a week course  of therapy for bacterial vaginosis.  She has an appointment to follow up  on September 30, 2008, at [facility].  She was  given preterm labor precautions.      Scheryl Darter, MD  Electronically Signed     JA/MEDQ  D:  09/29/2008  T:  09/29/2008  Job:  [id number]

## 2010-12-08 NOTE — Discharge Summary (Signed)
NAME:  Leslie Owen, Leslie Owen            ACCOUNT NO.:  [id number]   MEDICAL RECORD NO.:  [id number]          PATIENT TYPE:  INP   LOCATION:  9154                          [facility]ACILITY:  [facility]   PHYSICIAN:  Allie Bossier, MD        DATE OF BIRTH:  01-12-1981   DATE OF ADMISSION:  08/28/2008  DATE OF DISCHARGE:  08/29/2008                               DISCHARGE SUMMARY   DISCHARGE DIAGNOSES:  1. Preterm contractions/threatened labor.  2. Fibroid uterus.  3. Intrauterine pregnancy at 42 weeks' gestational age.   REASON FOR ADMISSION:  Ms. Leslie Owen is a 30 year old gravida 1,  para 0, who presented to the MAU on the day of admission complaining of  contractions that were painful.  On exam, at the MAU, her cervix was  approximately external os fingertip, very high, and cervix was long.  The patient was admitted for observation.  Of note, she had a previous  admission from August 23, 2008 through August 25, 2008.  She received  betamethasone during that time period.  Her EDC is Dec 16, 2008.   HOSPITAL COURSE:  The patient was admitted to antenatal ward.  Because  she was having some contractions and irritability, she was given 12  hours of magnesium.  After magnesium, her contractions were essentially  stable and back to her baseline.  Reexamination of her cervix revealed a  unchanged cervical exam with fingertip exam closing internal os was long  and baby in high station.  The patient was then discharged home with  modified bedrest instructions and appointment for followup in the [facility].   MEDICATIONS AT DISCHARGE:  The patient takes prenatal vitamins daily.  She is on Procardia 30 mg XL twice daily.  She takes Pepcid as needed  for heartburn and occasional ibuprofen for pain.   INSTRUCTION:  I had a lengthy discussion with the patient regarding her  contractions and signs and symptoms to watch out for a reason to return  to the MAU.  I discussed with modified bedrest  basically at home with  out of bed for activities of daily living and to avoid prolonged  standing or lift anything heavy.  Additionally, the patient already has  a appointment for [facility] on Monday, September 02, 2008.  The  patient voiced understanding of instructions and her questions were  answered.   CONDITION AT DISCHARGE:  Good.      Odie Sera, DO  Electronically Signed     ______________________________  Allie Bossier, MD   MC/MEDQ  D:  08/29/2008  T:  08/30/2008  Job:  [id number]

## 2010-12-11 NOTE — Op Note (Signed)
NAME:  Leslie Owen, Leslie Owen            ACCOUNT NO.:  [id number]   MEDICAL RECORD NO.:  [id number]          PATIENT TYPE:  AMB   LOCATION:  N[facility]C                         FACILITY:  [facility]   PHYSICIAN:  Timothy P. Fontaine, M.D.DATE OF BIRTH:  1980-12-07   DATE OF PROCEDURE:  08/23/2005  DATE OF DISCHARGE:                                 OPERATIVE REPORT   PREOPERATIVE DIAGNOSIS:  Endometriosis, leiomyomata, pelvic pain.   POSTOPERATIVE DIAGNOSIS:  Endometriosis, leiomyomata, pelvic pain.   PROCEDURE:  Laparoscopic biopsy, fulguration, endometriosis   SURGEON:  Timothy P. Fontaine, M.D.   ANESTHETIC:  General.   ESTIMATED BLOOD LOSS:  Minimal.   COMPLICATIONS:  None.   SPECIMEN:  Peritoneal biopsy x3.   FINDINGS:  Anterior cul-de-sac normal, posterior cul-de-sac with white  fibrotic versus white endometriotic implant base of the right uterosacral  ligament, powder burn endometriotic implant upper left uterosacral ligament,  powder burn deep lesion upper mid cul-de-sac, white fibrotic superficial  lesion left pelvic sidewall, uterus grossly normal in size with two myomas,  one anterior fundal, second larger posterior uterine surface, right and left  fallopian tubes normal length, caliber fimbriated ends ,left ovary grossly  normal, free and mobile with a small red apparent endometriotic implant at  the uterine ovarian ligament, right ovary grossly normal, adherent band to  the right pelvic sidewall sharply lysed, left pelvic sidewall fibrotic area  excised in its entirety, left uterosacral implant biopsied off, subsequent  fulguration upper mid cul-de-sac, powder burn lesion biopsied, subsequently  fulgurated. All visible areas of endometriosis were fulgurated and  subsequent Interceed placed.   DESCRIPTION OF PROCEDURE:  The patient was taken to the operating room,  underwent general anesthesia, placed in the dorsal lithotomy position,  received an abdominal perineal vaginal  preparation with Betadine solution  per nursing personnel and the bladder was emptied with in-and-out Foley  catheterization. EUA was performed, Hulka tenaculum placed on the cervix.  The patient was draped in the usual fashion. A vertical infraumbilical  incision was made, a Veress need placed, its position verified with water  and approximately 2.5 liters of carbon dioxide gas was infused without  difficulty. The 10 mm laparoscopic trocar was then placed without  difficulty, its position verified visually. A 5 mm suprapubic port in the  midline was then placed under direct visualization after transillumination  for the vessels without difficulty. Examination of the pelvic organs and  upper abdominal exam was then carried out with findings noted above.  Representative biopsies of the cul-de-sac endometriosis were taken. The left  sidewall white endometriotic implant was elevated, ureter identified away  from the area and the peritoneum was excised. Subsequently bipolar  cauterization was applied to the peritoneal interface to achieve hemostasis.  All visible areas of endometriosis were then bipolar cauterized with  attention to ureteral and vascular structures to avoid injury. The right  ovary was then elevated and the adhesive band was sharply lysed to free the  ovary. The pelvis was copiously irrigated, adequate hemostasis was  visualized. Intercede was subsequently placed over all the biopsy  fulguration sites, suprapubic port was then removed, the  gas allowed to  escape. Reinspection under a low pressure situation showed adequate  hemostasis and the infraumbilical port was backed out under direct  visualization showing adequate hemostasis and no evidence of hernia  formation. 0.25% Marcaine was injected in both skin incision sites. A #0  Vicryl interrupted subcutaneous fascial stitch was placed infraumbilically  and both skin sites were closed using Dermabond skin adhesive. The Hulka   tenaculum was removed. The patient placed in supine position, awakened  without difficulty and taken to the recovery room in good condition having  tolerated the procedure well.      Timothy P. Fontaine, M.D.  Electronically Signed     TPF/MEDQ  D:  08/23/2005  T:  08/24/2005  Job:  [id number]

## 2010-12-11 NOTE — H&P (Signed)
NAME:  Leslie Owen, Leslie Owen                        ACCOUNT NO.:  [id number]   MEDICAL RECORD NO.:  [id number]                   PATIENT TYPE:  AMB   LOCATION:  NESC                                 FACILITY:  [facility]   PHYSICIAN:  Timothy P. Fontaine, M.D.           DATE OF BIRTH:  September 24, 1980   DATE OF ADMISSION:  01/28/2004  DATE OF DISCHARGE:                                HISTORY & PHYSICAL   Patient is being admitted to [facility] on July 5th at 7:30  for surgery.   CHIEF COMPLAINT:  Irregular bleeding.   HISTORY OF PRESENT ILLNESS:  A 30 year old G0 presents for her annual exam,  complaining of menses every 2-3 months with regular-type flow.  She also has  pelvic cramping that occurs throughout the month.  Comes and goes, midline  to lower pelvic, sharp stabbing to cramping in nature, lasting minutes at a  time.  She had outpatient evaluation, which included a normal TSH,  Prolactin, CBC, negative HCG and an ultrasound which revealed both ovaries  visualized and normal with the endometrial stripe measuring 14 mm  inhomogeneous with multiple cystic spaces.  Patient is admitted at this time  for hysteroscopy and D&C, due to the cystic nature of her endometrium.  Her  oligomenorrhea to rule out hyperplasia or endometrial polyps.   PAST MEDICAL HISTORY:  Uncomplicated.   PAST SURGICAL HISTORY:  None.   ALLERGIES:  No medications.   REVIEW OF SYSTEMS:  Noncontributory.   SOCIAL HISTORY:  Noncontributory.   FAMILY HISTORY:  Noncontributory.   ADMISSION PHYSICAL EXAMINATION:  VITAL SIGNS:  Afebrile.  Vital signs are  stable.  HEENT:  Normal.  LUNGS:  Clear.  CARDIAC:  Regular rate and rhythm without murmurs, rubs or gallops.  ABDOMEN:  Benign.  PELVIC:  External BUS, vagina normal.  Cervix normal.  Uterus normal size,  midline, mobile, nontender.  Adnexa without masses or tenderness.   ASSESSMENT:  A 30 year old G0 with irregular menses every 2-3 months.  Normal prolactin and TSH.  Tentatively planning low-dose oral contraceptive  regulation.  Ultrasound does show a multicystic thickening endometrium, and  she is here for hysteroscopy dilatation and curettage, rule out polyp, rule  out hyperplasia.  I reviewed what is involved with the procedure to include  the expected intraoperative postoperative courses.  Instrumentation,  including the use of the resectoscope and D&C instruments were discussed  with her.  The risks of infection, bleeding leading to hemorrhage,  necessitating transfusion, and the risks of transfusion were all discussed  with her.  The risks of uterine perforation, damage to internal organs,  including bowel, bladder, ureters, vessels, and nerves, necessitating major  reparative surgeries and future reparative surgeries, including ostomy  formation, was all discussed with her.  The patient's questions were  answered, and she is ready to proceed with surgery.  Timothy P. Audie Box, M.D.    TPF/MEDQ  D:  01/15/2004  T:  01/15/2004  Job:  [id number]

## 2010-12-11 NOTE — Op Note (Signed)
NAME:  Leslie Owen, Leslie Owen                        ACCOUNT NO.:  [id number]   MEDICAL RECORD NO.:  [id number]                   PATIENT TYPE:  AMB   LOCATION:  NESC                                 FACILITY:  [facility]   PHYSICIAN:  Timothy P. Fontaine, M.D.           DATE OF BIRTH:  1980/11/20   DATE OF PROCEDURE:  01/28/2004  DATE OF DISCHARGE:                                 OPERATIVE REPORT   PREOPERATIVE DIAGNOSES:  Dysmenorrhea, menorrhagia.   POSTOPERATIVE DIAGNOSES:  Endometrial polyp.   PROCEDURE:  Hysteroscopy D&C.   SURGEON:  Timothy P. Fontaine, M.D.   ANESTHESIA:  General.   ESTIMATED BLOOD LOSS:  Minimal.   COMPLICATIONS:  None.   SPECIMENS:  1. Endometrial curetting.  2. Endometrial polyp.   FINDINGS:  Hysteroscopy adequate noting right and left tubal ostia, fundus,  anterior and posterior uterine surfaces, lower uterine segment, endocervical  canal all visualized.  The patient had a generalized thickening of the  endometrium with a probable sessile polyp along the mid posterior uterine  surface.   DESCRIPTION OF PROCEDURE:  The patient was taken to the operating room,  underwent general anesthesia, was placed in the low dorsal lithotomy  position, received a perineal vaginal preparation with Betadine solution per  nursing personnel, bladder empty with in and out Foley catheterization, EUA  performed, patient draped in the usual fashion.  The cervix was visualized  with a speculum, anterior lip grasped with a single tooth tenaculum and the  cervix was gently gradually dilated to admit the operative hysteroscope.  Hysteroscopy was performed with findings noted above. There was a thickening  along the posterior uterine surface at first thought to be thickening of the  endometrium following the sharp curettage, abundant endometrial tissue was  removed. There was a clear polypoid area within the tissue and this was  removed and sent separately as an endometrial polyp.   On rehysteroscopy, the  cavity was noted to be empty, there was no evidence of thickening remaining  and was normal with good distention, no evidence of perforation. The  instruments were removed, adequate hemostasis, patient placed in supine  position, awakened without difficulty, taken to the recovery room in good  condition having tolerated the procedure well.                                               Timothy P. Audie Box, M.D.    TPF/MEDQ  D:  01/28/2004  T:  01/28/2004  Job:  [id number]

## 2010-12-11 NOTE — Op Note (Signed)
NAME:  Leslie Owen, Leslie Owen            ACCOUNT NO.:  [id number]   MEDICAL RECORD NO.:  [id number]          PATIENT TYPE:  AMB   LOCATION:  [facility]                         FACILITY:  [facility]   PHYSICIAN:  Timothy P. Fontaine, M.D.DATE OF BIRTH:  1980-10-18   DATE OF PROCEDURE:  05/05/2004  DATE OF DISCHARGE:                                 OPERATIVE REPORT   PREOPERATIVE DIAGNOSES:  1.  Leiomyomata.  2.  Pelvic pain.   POSTOPERATIVE DIAGNOSES:  1.  Endometriosis.  2.  Leiomyomata.   PROCEDURE:  Laser laparoscopy with biopsy fulguration of endometriosis.   SURGEON:  Timothy P. Fontaine, M.D.   ANESTHESIA:  General.   ESTIMATED BLOOD LOSS:  Minimal.   COMPLICATIONS:  None.   SPECIMENS:  Peritoneal biopsy x2.   FINDINGS:  Anterior cul de sac normal.  Posterior cul de sac with multiple  areas of endometriosis, ranging from classic powder burn to white fibrotic  to red shaggy in appearance.  The bulk of the endometriosis was located  between the uterosacral ligaments.  There were two lateral areas, one in the  left of the uterosacral ligament above the course of the ureter on the  pelvic side wall and one on the right pelvic side wall, again lateral to the  right uterosacral ligament above the course of the ureter.  The right and  left fallopian tubes are normal length and caliber, fimbriated ends.  The  left ovary is grossly normal, free and mobile.  The right ovary was  initially adherent to the right side wall, bluntly dissected free with  findings of powder burn and shaggy endometriosis overlying the ovary.  The  uterus globally shaped, grossly normal in size with multiple myomas, given  the contour and irregular appearance.  Upper abdominal exam shows liver  smooth.  No abnormalities.  Gallbladder not visualized.  Appendix not  visualized.  No evidence of upper abdominal disease, grossly.  Two  representative areas of endometriosis were biopsied subsequently using the  carbon  dioxide laser.  All visible areas were ablated.  Interseed was placed  over the posterior cul de sac at the end of the procedure.   PROCEDURE:  Patient was taken to the operating room and underwent general  anesthesia.  Was placed in the low dorsal lithotomy position.  Received an  abdominal perineal vaginal preparation with Betadine solution.  The bladder  emptied with in and out Foley catheterization.  EUA performed.  A Hulka  tenaculum was placed on the cervix.  The patient was draped in the usual  fashion.  A vertical infraumbilical incision was made.  The Veress needle  placed.  Its position verified with water, and approximately 2.5 liters of  carbon dioxide gas was infused without difficulty.  The 10 mm laparoscopic  trocar was then placed without difficulty, and its position verified  visually.  A 5 mm suprapubic port was then placed under direct visualization  after transillumination of the vessels without difficulty.  Examination of  the pelvic organs and upper abdominal exam was carried out with findings  noted above.  Two representative areas of endometriosis in  the posterior cul  de sac were biopsied.  Subsequently, the right ovary was freed with blunt  dissection from the right pelvic side wall with underlying endometriosis  found.  Both ureters were identified and traced along their course in the  pelvis.  Subsequently, after assuring proper laser safety and an alignment  of the laser, all visible areas were ablated using the carbon dioxide laser,  20 watts, continuous power.  The pelvis was copiously irrigated.  Adequate  hemostasis was visualization at all sites.  Interseed was subsequently  placed in the posterior cul de sac.  The gas allowed to escape, the  suprapubic port removed.  Adequate hemostasis visualized.  The  infraumbilical port backed out under direct visualization showing adequate  hemostasis with no evidence of hernia formation.  A 0 Vicryl interrupted   subcutaneous fascial stitch was placed infraumbilically.  Both port sites  were injected using 0.25% Marcaine and reapproximated using Dermabond skin  adhesive.  The Hulka tenaculum was removed.  Patient awakened without  difficulty and taken to the recovery room in good condition, having  tolerated the procedure well.      TPF/MEDQ  D:  05/05/2004  T:  05/05/2004  Job:  [id number]

## 2010-12-11 NOTE — H&P (Signed)
NAME:  Leslie Owen, JUHASZ            ACCOUNT NO.:  [id number]   MEDICAL RECORD NO.:  [id number]          PATIENT TYPE:  AMB   LOCATION:  [facility]                         FACILITY:  [facility]   PHYSICIAN:  Timothy P. Fontaine, M.D.DATE OF BIRTH:  03/03/1981   DATE OF ADMISSION:  DATE OF DISCHARGE:                                HISTORY & PHYSICAL   DATE OF ADMISSION:  August 23, 2005 1 p.m.   CHIEF COMPLAINT:  Endometriosis, leiomyomata, pelvic pain.   HISTORY OF PRESENT ILLNESS:  A 30 year old G0, history of laparoscopic-  proven endometriosis and leiomyomata with increasing pelvic pain despite  Lupron Depot x3 doses. The patient did well initially following a prior  laparoscopy with ablation of endometriosis and follow-up Lupron Depot, but  her pelvic pain has read has returned reminiscent of her prior pain with her  endometriosis over the past month-and-a-half. The patient is admitted at  this time for laser video laparoscopy.   PAST MEDICAL HISTORY:  Uncomplicated.   PAST SURGICAL HISTORY:  Includes laparoscopy October 2005, hysteroscopy [facility]  July 2005.   CURRENT MEDICATIONS:  Lupron Depot 11.25 mg May 25, 2005.   ALLERGIES:  None.   REVIEW OF SYSTEMS:  Noncontributory.   FAMILY HISTORY:  Noncontributory.   SOCIAL HISTORY:  Noncontributory.   ADMISSION PHYSICAL EXAMINATION:  VITAL SIGNS:  Afebrile, vital signs were  stable.  HEENT:  Normal.  LUNGS:  Clear.  CARDIAC:  Regular rate without rubs, murmurs or gallops.  ABDOMEN:  Benign.  PELVIC:  External, BUS, vagina normal. Cervix normal. Uterus normal size,  midline and mobile, nontender. Adnexa without masses or tenderness.   ASSESSMENT:  A 30 year old gravida 0, currently on Lupron Depot, history of  endometriosis, leiomyomata, status post a laparoscopic evaluation one year  ago with increasing pelvic pain reminiscent of her prior pain with her  endometriosis despite Lupron Depot. Various scenarios and options were  reviewed and the patient wants to proceed with re-laparoscopy. The risks,  benefits, indications and alternatives for the procedure were reviewed with  her to include the expected intraoperative/postoperative courses. The  patient clearly understands there are no guarantees as far as pain relief is  concerned; that her pain may persist, worsen or change following that  procedure; and she understands and accepts this. She understands I may not  address endometriosis as encountered, she may have transient relief with  relapse in the future, all of which was understood and accepted.  Insufflation, trocar placement, multiple port sites, use of laser  electrocautery, sharp/blunt dissection was all discussed, understood and  accepted. The risks of inadvertent injury to internal organs including  bowel, bladder, ureters, vessels and nerves necessitating major exploratory  reparative surgeries and future reparative surgeries including ostomy  formation either immediately recognized or delay recognized was all  discussed, understood, and accepted. The risks of infection both internal  requiring prolonged antibiotics as well as incisional requiring opening and  draining of incisions and closure by secondary intention was reviewed, and  the risks of hemorrhage necessitating transfusion and the risks of  transfusion including transfusion reaction, hepatitis, HIV, mad cow disease  and  other unknown entities was reviewed, understood, and accepted. Again,  the patient clearly understands there are no guarantees as far as pain  relief. She may have immediate relief with relapse in the future or no  relief. She may have changing pain or worsening pain, all of which she  understands and accepts. The patient's questions are answered to her  satisfaction and she is ready to proceed with surgery.      Timothy P. Fontaine, M.D.  Electronically Signed     TPF/MEDQ  D:  08/16/2005  T:  08/16/2005  Job:   [id number]

## 2010-12-11 NOTE — H&P (Signed)
NAME:  Leslie Owen, Leslie Owen            ACCOUNT NO.:  [id number]   MEDICAL RECORD NO.:  [id number]          PATIENT TYPE:  AMB   LOCATION:  [facility]                         [facility]ACILITY:  [facility]   PHYSICIAN:  Timothy P. Fontaine, M.D.DATE OF BIRTH:  01/04/1981   DATE OF ADMISSION:  DATE OF DISCHARGE:                                HISTORY & PHYSICAL   CHIEF COMPLAINT:  Left pelvic pain.   HISTORY OF PRESENT ILLNESS:  A 30 year old G 0 with a half-year history of  pelvic pain.  Patient was initially evaluated in the summer months with  ultrasonography, which showed multiple small myomas and a questionable  intracavitary abnormality.  She underwent a hysteroscopy D&C in July, which  showed a functional-type endometrial polyp and secretory endometrium.  Patient's pelvic pain has persisted, primarily on the left, intermittent,  sharp, stabbing, aching.  Lasts minutes to hours.  She had been evaluated  historically by a gastroenterologist, who did not feel it was of GI  etiology.  She has a negative urine culture and negative GC and Chlamydia  screen.  Otherwise normal ultrasound with the exception of multiple small  myomas.  Right and left ovaries were visualized and normal.  Patient is  admitted at this time for laser video laparoscopy, rule out endometriosis  and adhesive disease.   PAST MEDICAL HISTORY:  None.   PAST SURGICAL HISTORY:  Hysteroscopy, D&C in July, 2005.   ALLERGIES:  None.   CURRENT MEDICATIONS:  None.   REVIEW OF SYSTEMS:  Noncontributory.   SOCIAL HISTORY:  Noncontributory.   FAMILY HISTORY:  Noncontributory.   ADMISSION PHYSICAL EXAMINATION:  VITAL SIGNS:  Afebrile.  Stable.  HEENT:  Normal.  LUNGS:  Clear.  HEART:  Regular rate and rhythm without murmurs, rubs or gallops.  ABDOMEN:  Benign.  PELVIC:  Bimanual uterus of normal size, midline, mobile.  Adnexa without  masses or tenderness.   ASSESSMENT:  Persistent pelvic pain, primarily on the left, for laser  video  laparoscopy.  I reviewed the risks, benefits, indications, and alternatives  with the patient, to include the expected intraoperative postoperative  courses.  Insufflation, trocar placement, use of sharp/blunt dissection,  electrocautery, and laser were all reviewed with her.  The patient  understands there are no guarantees as far as pain relief is concerned.  We  may find pathology or we may find a normal pelvis.  Her pain may persist,  worsen, or change following the procedure, all of which she understands and  accepts.  The risks of infection, both internal, requiring prolonged  antibiotics as well as incisional, requiring open and draining of incisions,  was reviewed.  The risks of hemorrhage necessitating transfusion and the  risks of transfusion, including hepatitis, HIV, mad cow disease, and other  unknown entities, was all discussed, understood and accepted.  The risks of  inadvertant injury to internal organs, including bowel, bladder, ureters,  vessels, and nerves, necessitating major exploratory reparative surgeries,  and future reparative surgeries, including ostotomy formation, was all  discussed, understood, and accepted.  Again, the patient understands there  are no guarantees as far as pain relief,  and she is ready to proceed with  surgery.  She has a negative HCG and a hemoglobin of 11.4 with a hematocrit  of 36.3.  Her white count is 4.9.      TPF/MEDQ  D:  05/04/2004  T:  05/04/2004  Job:  [id number]

## 2011-04-26 LAB — URINALYSIS, ROUTINE W REFLEX MICROSCOPIC
Glucose, UA: NEGATIVE
Ketones, ur: NEGATIVE
Protein, ur: NEGATIVE
Urobilinogen, UA: 0.2

## 2011-04-26 LAB — HCG, QUANTITATIVE, PREGNANCY: hCG, Beta Chain, Quant, S: 47276 — ABNORMAL HIGH

## 2011-04-26 LAB — URINE MICROSCOPIC-ADD ON

## 2011-05-06 LAB — URINALYSIS, ROUTINE W REFLEX MICROSCOPIC
Ketones, ur: NEGATIVE
Nitrite: NEGATIVE
pH: 7

## 2011-05-06 LAB — DIFFERENTIAL
Basophils Absolute: 0
Basophils Relative: 1
Eosinophils Absolute: 0.1
Monocytes Relative: 10
Neutrophils Relative %: 44

## 2011-05-06 LAB — CBC
HCT: 34.8 — ABNORMAL LOW
Hemoglobin: 11.8 — ABNORMAL LOW
RBC: 3.96

## 2011-05-06 LAB — D-DIMER, QUANTITATIVE: D-Dimer, Quant: <0.22

## 2011-05-06 LAB — COMPREHENSIVE METABOLIC PANEL
ALT: 15
Alkaline Phosphatase: 48
CO2: 22
Chloride: 106
GFR calc non Af Amer: >60
Glucose, Bld: 90
Potassium: 3.9
Sodium: 135
Total Bilirubin: 0.5

## 2011-05-06 LAB — POCT PREGNANCY, URINE: Operator id: 244461

## 2011-05-06 LAB — RAPID URINE DRUG SCREEN, HOSP PERFORMED
Amphetamines: NOT DETECTED
Barbiturates: NOT DETECTED

## 2011-05-06 LAB — MAGNESIUM: Magnesium: 1.9

## 2011-05-06 LAB — URINE MICROSCOPIC-ADD ON

## 2011-05-06 LAB — POCT CARDIAC MARKERS: Troponin i, poc: <0.05

## 2011-10-10 ENCOUNTER — Inpatient Hospital Stay (HOSPITAL_COMMUNITY)
Admission: AD | Admit: 2011-10-10 | Discharge: 2011-10-11 | Disposition: A | Discharge status: Home / Self Care | Payer: Medicaid Other | Source: Ambulatory Visit | Attending: Obstetrics & Gynecology | Admitting: Obstetrics & Gynecology

## 2011-10-10 ENCOUNTER — Encounter (HOSPITAL_COMMUNITY): Payer: Self-pay | Admitting: *Deleted

## 2011-10-10 DIAGNOSIS — O4700 False labor before 37 completed weeks of gestation, unspecified trimester: Secondary | ICD-10-CM | POA: Insufficient documentation

## 2011-10-10 DIAGNOSIS — O4702 False labor before 37 completed weeks of gestation, second trimester: Secondary | ICD-10-CM

## 2011-10-10 HISTORY — DX: Anemia, unspecified: D64.9

## 2011-10-10 HISTORY — DX: Leiomyoma of uterus, unspecified: D25.9

## 2011-10-10 HISTORY — DX: Encounter for other preprocedural examination: Z01.818

## 2011-10-10 HISTORY — DX: Reserved for concepts with insufficient information to code with codable children: IMO0002

## 2011-10-10 HISTORY — DX: Leiomyoma of uterus, unspecified: O34.10

## 2011-10-10 HISTORY — DX: Calculus of gallbladder without cholecystitis without obstruction: K80.20

## 2011-10-10 LAB — URINALYSIS, ROUTINE W REFLEX MICROSCOPIC
Hgb urine dipstick: NEGATIVE
Nitrite: NEGATIVE
Specific Gravity, Urine: 1.01 (ref 1.005–1.030)
Urobilinogen, UA: 0.2 mg/dL (ref 0.0–1.0)

## 2011-10-10 NOTE — MAU Note (Signed)
Pt G3 P2 at 22wks, having contractions every 2-23min since 1600.  Hx of PTD at 32wks-C/S for fetal distress.  Denies leaking or bleeding.

## 2011-10-10 NOTE — Discharge Instructions (Signed)
    ________________________________________     To schedule your Maternity Eligibility Appointment, please call [phone].  When you arrive for your appointment you must bring the following items or information listed below.  Your appointment will be rescheduled if you do not have these items or are 15 minutes late. If currently receiving Medicaid, you MUST bring: 1. Medicaid Card 2. Social Security Card 3. Picture ID 4. Proof of Pregnancy 5. Verification of current address if the address on Medicaid card is incorrect "postmarked mail" If not receiving Medicaid, you MUST bring: 1. Social Security Card 2. Picture ID 3. Birth Certificate (if available) Passport or *Green Card 4. Proof of Pregnancy 5. Verification of current address "postmarked mail" for each income presented. 6. Verification of insurance coverage, if any 7. Check stubs from each employer for the previous month (if unable to present check stub  for each week, we will accept check stub for the first and last week ill the same month.) If you can't locate check stubs, you must bring a letter from the employer(s) and it must have the following information on letterhead, typed, in English: o name of company o company telephone number o how long been with the company, if less than one month o how much person earns per hour o how many hours per week work o the gross pay the person earned for the previous month If you are 29 years old or less, you do not have to bring proof of income unless you work or live with the father of the baby and at that time we will need proof of income from you and/or the father of the baby. Green Card recipients are eligible for Medicaid for Pregnant Women (MPW)  Preventing Preterm Labor Preterm labor is when a pregnant woman has contractions that cause the cervix to open, shorten, and thin before 37 weeks of pregnancy. You will have regular contractions (tightening) 2 to 3 minutes apart. This  usually causes discomfort or pain. HOME CARE  Eat a healthy diet.   Take your vitamins as told by your doctor.   Drink enough fluids to keep your pee (urine) clear or pale yellow every day.   Get rest and sleep.   Do not have sex if you are at high risk for preterm labor.   Follow your doctor's advice about activity, medicines, and tests.   Avoid stress.   Avoid hard labor or exercise that lasts for a long time.   Do not smoke.  GET HELP RIGHT AWAY IF:   You are having contractions.   You have belly (abdominal) pain.   You have bleeding from your vagina.   You have pain when you pee (urinate).   You have abnormal discharge from your vagina.   You have a temperature by mouth above 102 F (38.9 C).  MAKE SURE YOU:  Understand these instructions.   Will watch your condition.   Will get help if you are not doing well or get worse.  Document Released: 10/08/2008 Document Revised: 07/01/2011 Document Reviewed: 10/08/2008 [facility] Patient Information 2012 [place], [place].

## 2011-10-10 NOTE — MAU Provider Note (Signed)
History     Chief Complaint  Patient presents with  . Contractions   HPI Patient is a 31 yo G3P1102 at [redacted]w[redacted]d presenting for contractions that started today. She states her abdomen feels tight. Every 6-7 minutes. Patient denies vaginal bleeding, discharge, LOF. Endorses good fetal movement.  No prenatal care since December. Patient now has Medicaid but has not been to [facility] or any other office to be seen. Patient has a history of preterm delivery at 32 weeks.   OB History    Grav Para Term Preterm Abortions TAB SAB Ect Mult Living   3 2 1 1      2       Past Medical History  Diagnosis Date  . Ulcer   . Encounter for diagnostic endoscopy   . Uterine fibroids affecting pregnancy   . Gall bladder stones   . Anemia     Past Surgical History  Procedure Date  . Cesarean section   . Myomectomy   . Laporoscopy   . Ovarian cysts   . Wisdom tooth extraction     Family History  Problem Relation Age of Onset  . Arthritis Mother   . Hypertension Mother   . Heart disease Father   . Cancer Maternal Grandmother     History  Substance Use Topics  . Smoking status: Former Smoker -- 0.2 packs/day for .5 years    Types: Cigarettes    Quit date: 10/09/2001  . Smokeless tobacco: Not on file  . Alcohol Use: No    Allergies: Allergies not on file  No prescriptions prior to admission    Review of Systems  Constitutional: Negative for fever and chills.  Respiratory: Negative for shortness of breath.   Cardiovascular: Negative for chest pain.  Gastrointestinal: Positive for abdominal pain. Negative for nausea and vomiting.  Genitourinary: Negative for dysuria.  Musculoskeletal: Negative for back pain.  Skin: Negative for rash.  Neurological: Negative for dizziness and headaches.   Physical Exam   Blood pressure 120/69, pulse 91, temperature 98 F (36.7 C), temperature source Oral, resp. rate 16, height 5\' 3"  (1.6 m), weight 71.85 kg (158 lb 6.4 oz).  Physical Exam    Constitutional: She is oriented to person, place, and time. She appears well-developed and well-nourished. No distress.  HENT:  Head: Normocephalic and atraumatic.  Neck: Normal range of motion.  Cardiovascular: Normal rate.   Respiratory: Effort normal.  GI: Soft.       Gravid. Toco in place.  Genitourinary:       Dilation: Closed Effacement (%): Thick Cervical Position: Posterior Exam by:: A. Nitisha Civello, MD   Musculoskeletal: Normal range of motion. She exhibits no edema and no tenderness.  Neurological: She is alert and oriented to person, place, and time. No cranial nerve deficit.  Skin: Skin is dry. No rash noted.    MAU Course  Procedures Results for orders placed during the hospital encounter of 10/10/11 (from the past 24 hour(s))  URINALYSIS, ROUTINE W REFLEX MICROSCOPIC     Status: Normal   Collection Time   10/10/11 10:25 PM      Component Value Range   Color, Urine YELLOW  YELLOW    APPearance CLEAR  CLEAR    Specific Gravity, Urine 1.010  1.005 - 1.030    pH 7.5  5.0 - 8.0    Glucose, UA NEGATIVE  NEGATIVE (mg/dL)   Hgb urine dipstick NEGATIVE  NEGATIVE    Bilirubin Urine NEGATIVE  NEGATIVE    Ketones,  ur NEGATIVE  NEGATIVE (mg/dL)   Protein, ur NEGATIVE  NEGATIVE (mg/dL)   Urobilinogen, UA 0.2  0.0 - 1.0 (mg/dL)   Nitrite NEGATIVE  NEGATIVE    Leukocytes, UA NEGATIVE  NEGATIVE     MDM Placed on toco. Minimal uterine irritability noted. No regular contractions.  Patient is 22 weeks; FFN not collected.  Assessment and Plan  31 yo G3P1102 at [redacted]w[redacted]d presenting for contractions - Cervix closed, no regular contractions noted on monitor - Discussed preterm labor with patient - Patient has medicaid. She needs to establish care. States she will go to the [facility] - Discussed with Zerita Boers, CNM. Patient discharged home in stable medical condition.  Laurieanne Galloway 10/10/2011, 10:46 PM

## 2014-05-27 ENCOUNTER — Encounter (HOSPITAL_COMMUNITY): Payer: Self-pay | Admitting: *Deleted

## 2014-07-03 ENCOUNTER — Emergency Department (HOSPITAL_COMMUNITY): Payer: 59

## 2014-07-03 ENCOUNTER — Encounter (HOSPITAL_COMMUNITY): Payer: Self-pay

## 2014-07-03 ENCOUNTER — Emergency Department (HOSPITAL_COMMUNITY)
Admission: EM | Admit: 2014-07-03 | Discharge: 2014-07-03 | Disposition: A | Discharge status: Home / Self Care | Payer: 59 | Attending: Emergency Medicine | Admitting: Emergency Medicine

## 2014-07-03 DIAGNOSIS — S31109A Unspecified open wound of abdominal wall, unspecified quadrant without penetration into peritoneal cavity, initial encounter: Secondary | ICD-10-CM

## 2014-07-03 DIAGNOSIS — Z8719 Personal history of other diseases of the digestive system: Secondary | ICD-10-CM | POA: Insufficient documentation

## 2014-07-03 DIAGNOSIS — Z872 Personal history of diseases of the skin and subcutaneous tissue: Secondary | ICD-10-CM | POA: Insufficient documentation

## 2014-07-03 DIAGNOSIS — Y838 Other surgical procedures as the cause of abnormal reaction of the patient, or of later complication, without mention of misadventure at the time of the procedure: Secondary | ICD-10-CM | POA: Insufficient documentation

## 2014-07-03 DIAGNOSIS — Z87891 Personal history of nicotine dependence: Secondary | ICD-10-CM | POA: Diagnosis not present

## 2014-07-03 DIAGNOSIS — T8189XA Other complications of procedures, not elsewhere classified, initial encounter: Secondary | ICD-10-CM | POA: Insufficient documentation

## 2014-07-03 DIAGNOSIS — R1031 Right lower quadrant pain: Secondary | ICD-10-CM

## 2014-07-03 DIAGNOSIS — Z862 Personal history of diseases of the blood and blood-forming organs and certain disorders involving the immune mechanism: Secondary | ICD-10-CM | POA: Insufficient documentation

## 2014-07-03 DIAGNOSIS — Z8742 Personal history of other diseases of the female genital tract: Secondary | ICD-10-CM | POA: Diagnosis not present

## 2014-07-03 LAB — URINALYSIS, ROUTINE W REFLEX MICROSCOPIC
Bilirubin Urine: NEGATIVE
Glucose, UA: NEGATIVE mg/dL
Hgb urine dipstick: NEGATIVE
Ketones, ur: NEGATIVE mg/dL
LEUKOCYTES UA: NEGATIVE
Nitrite: NEGATIVE
PH: 6 (ref 5.0–8.0)
Protein, ur: NEGATIVE mg/dL
SPECIFIC GRAVITY, URINE: 1.035 — AB (ref 1.005–1.030)
Urobilinogen, UA: 0.2 mg/dL (ref 0.0–1.0)

## 2014-07-03 LAB — BASIC METABOLIC PANEL
Anion gap: 13 (ref 5–15)
BUN: 10 mg/dL (ref 6–23)
CO2: 23 mEq/L (ref 19–32)
Calcium: 9.4 mg/dL (ref 8.4–10.5)
Chloride: 100 mEq/L (ref 96–112)
Creatinine, Ser: 0.84 mg/dL (ref 0.50–1.10)
GFR calc Af Amer: >90 mL/min (ref >90)
GFR calc non Af Amer: >90 mL/min (ref >90)
Glucose, Bld: 83 mg/dL (ref 70–99)
POTASSIUM: 3.9 meq/L (ref 3.7–5.3)
Sodium: 136 mEq/L — ABNORMAL LOW (ref 137–147)

## 2014-07-03 LAB — CBC WITH DIFFERENTIAL/PLATELET
BASOS ABS: 0 10*3/uL (ref 0.0–0.1)
Basophils Relative: 0 % (ref 0–1)
Eosinophils Absolute: 0.1 10*3/uL (ref 0.0–0.7)
Eosinophils Relative: 2 % (ref 0–5)
HEMATOCRIT: 38.9 % (ref 36.0–46.0)
Hemoglobin: 12.7 g/dL (ref 12.0–15.0)
Lymphocytes Relative: 40 % (ref 12–46)
Lymphs Abs: 2.2 10*3/uL (ref 0.7–4.0)
MCH: 29.1 pg (ref 26.0–34.0)
MCHC: 32.6 g/dL (ref 30.0–36.0)
MCV: 89.2 fL (ref 78.0–100.0)
Monocytes Absolute: 0.6 10*3/uL (ref 0.1–1.0)
Monocytes Relative: 10 % (ref 3–12)
NEUTROS ABS: 2.6 10*3/uL (ref 1.7–7.7)
Neutrophils Relative %: 48 % (ref 43–77)
PLATELETS: 287 10*3/uL (ref 150–400)
RBC: 4.36 MIL/uL (ref 3.87–5.11)
RDW: 14.1 % (ref 11.5–15.5)
WBC: 5.5 10*3/uL (ref 4.0–10.5)

## 2014-07-03 MED ORDER — IOHEXOL 300 MG/ML  SOLN
50.0000 mL | Freq: Once | INTRAMUSCULAR | Status: AC | PRN
  Administered 2014-07-03: 50 mL via ORAL

## 2014-07-03 MED ORDER — SULFAMETHOXAZOLE-TRIMETHOPRIM 800-160 MG PO TABS: 1.0000 | ORAL_TABLET | Freq: Two times a day (BID) | ORAL | Status: DC

## 2014-07-03 MED ORDER — CEPHALEXIN 500 MG PO CAPS: 500.0000 mg | ORAL_CAPSULE | Freq: Four times a day (QID) | ORAL | Status: DC

## 2014-07-03 MED ORDER — MORPHINE SULFATE 4 MG/ML IJ SOLN
4.0000 mg | Freq: Once | INTRAMUSCULAR | Status: AC
  Administered 2014-07-03: 4 mg via INTRAVENOUS
  Filled 2014-07-03: qty 1

## 2014-07-03 MED ORDER — IOHEXOL 300 MG/ML  SOLN
100.0000 mL | Freq: Once | INTRAMUSCULAR | Status: AC | PRN
  Administered 2014-07-03: 100 mL via INTRAVENOUS

## 2014-07-03 MED ORDER — SODIUM CHLORIDE 0.9 % IV BOLUS (SEPSIS)
1000.0000 mL | Freq: Once | INTRAVENOUS | Status: AC
  Administered 2014-07-03: 1000 mL via INTRAVENOUS

## 2014-07-03 MED ORDER — HYDROCODONE-ACETAMINOPHEN 5-325 MG PO TABS: 2.0000 | ORAL_TABLET | ORAL | Status: DC | PRN

## 2014-07-03 MED ORDER — ONDANSETRON HCL 4 MG/2ML IJ SOLN
4.0000 mg | Freq: Once | INTRAMUSCULAR | Status: AC
  Administered 2014-07-03: 4 mg via INTRAVENOUS
  Filled 2014-07-03: qty 2

## 2014-07-03 NOTE — ED Provider Notes (Signed)
CSN: [id number]     Arrival date & time 07/03/14  0907 History   First MD Initiated Contact with Patient 07/03/14 1001     Chief Complaint  Patient presents with  . Wound Infection     (Consider location/radiation/quality/duration/timing/severity/associated sxs/prior Treatment) HPI Comments: Patient is a 33 year old female who presents with abdominal wound that she noticed 5 days ago. Symptoms started gradually and progressively worsened since the onset. Patient reports having an abdominal hysterectomy about 4-5 months ago and has a scar on her lower abdomen from the surgery. Part of the incision "popped open" 5 days ago and "dark stuff came out." She reports associated aching lower abdominal pain that developed after the wound opened. The wound is about the size of a dime and remains open. She reports associated redness. No aggravating/alleviating factors. [place]    Past Medical History  Diagnosis Date  . Ulcer   . Encounter for diagnostic endoscopy   . Uterine fibroids affecting pregnancy   . Gall bladder stones   . Anemia    Past Surgical History  Procedure Laterality Date  . Cesarean section    . Myomectomy    . Laporoscopy    . Ovarian cysts    . Wisdom tooth extraction     Family History  Problem Relation Age of Onset  . Arthritis Mother   . Hypertension Mother   . Heart disease Father   . Cancer Maternal Grandmother    History  Substance Use Topics  . Smoking status: Former Smoker -- 0.25 packs/day for .5 years    Types: Cigarettes    Quit date: 10/09/2001  . Smokeless tobacco: Not on file  . Alcohol Use: No   OB History    Gravida Para Term Preterm AB TAB SAB Ectopic Multiple Living   3 2 1 1      2      Review of Systems  Constitutional: Negative for fever, chills and fatigue.  HENT: Negative for trouble swallowing.   Eyes: Negative for visual disturbance.  Respiratory: Negative for shortness of breath.    Cardiovascular: Negative for chest pain and palpitations.  Gastrointestinal: Positive for abdominal pain. Negative for nausea, vomiting and diarrhea.  Genitourinary: Negative for dysuria and difficulty urinating.  Musculoskeletal: Negative for arthralgias and neck pain.  Skin: Positive for wound. Negative for color change.  Neurological: Negative for dizziness and weakness.  Psychiatric/Behavioral: Negative for dysphoric mood.      Allergies  Review of patient's allergies indicates no known allergies.  Home Medications   Prior to Admission medications   Medication Sig Start Date End Date Taking? Authorizing Provider  ibuprofen (ADVIL,MOTRIN) 200 MG tablet Take 400 mg by mouth every 6 (six) hours as needed for fever, headache or moderate pain.   Yes Historical Provider, MD   BP 128/82 mmHg  Pulse 91  Temp(Src) 98.9 F (37.2 C) (Oral)  Resp 16  SpO2 99%  Breastfeeding? Unknown Physical Exam  Constitutional: She is oriented to person, place, and time. She appears well-developed and well-nourished. No distress.  HENT:  Head: Normocephalic and atraumatic.  Eyes: Conjunctivae and EOM are normal.  Neck: Normal range of motion.  Cardiovascular: Normal rate and regular rhythm.  Exam reveals no gallop and no friction rub.   No murmur heard. Pulmonary/Chest: Effort normal and breath sounds normal. She has no wheezes. She has no rales. She exhibits no tenderness.  Abdominal: Soft. She exhibits no distension. There is tenderness. There  is no rebound.  Lower and right abdominal tenderness to palpation.   Musculoskeletal: Normal range of motion.  Neurological: She is alert and oriented to person, place, and time. Coordination normal.  Speech is goal-oriented. Moves limbs without ataxia.   Skin: Skin is warm and dry.  1x1cm superficial open wound of central lower abdomen. No drainage or surrounding edema or erythema.   Psychiatric: She has a normal mood and affect. Her behavior is  normal.  Nursing note and vitals reviewed.   ED Course  Procedures (including critical care time) Labs Review Labs Reviewed  BASIC METABOLIC PANEL - Abnormal; Notable for the following:    Sodium 136 (*)    All other components within normal limits  URINALYSIS, ROUTINE W REFLEX MICROSCOPIC - Abnormal; Notable for the following:    APPearance CLOUDY (*)    Specific Gravity, Urine 1.035 (*)    All other components within normal limits  CBC WITH DIFFERENTIAL    Imaging Review Ct Abdomen Pelvis W Contrast  07/03/2014   CLINICAL DATA:  Right lower quadrant pain. Draining wound at laparoscopic incision site from recent hysterectomy.  EXAM: CT ABDOMEN AND PELVIS WITH CONTRAST  TECHNIQUE: Multidetector CT imaging of the abdomen and pelvis was performed using the standard protocol following bolus administration of intravenous contrast.  CONTRAST:  166mL OMNIPAQUE IOHEXOL 300 MG/ML  SOLN  COMPARISON:  None.  FINDINGS: Lower Chest:  Unremarkable.  Hepatobiliary: No masses or other significant abnormality identified. Prior cholecystectomy noted. No evidence of biliary dilatation.  Pancreas: No mass, inflammatory changes, or other parenchymal abnormality identified.  Spleen: No evidence of splenomegaly. A hypovascular mass is seen in the anterior aspect of the spleen measuring 3.5 x 3.7 cm on image 12 of series 2. This has nonspecific characteristics.  Adrenal Glands:  No mass identified.  Kidneys/Urinary Tract: No masses identified. No evidence of hydronephrosis.  Stomach/Bowel/Peritoneum: No evidence of wall thickening, mass, or obstruction.  Vascular/Lymphatic: No pathologically enlarged lymph nodes identified. No other significant abnormality identified.  Reproductive: Hysterectomy noted. Adnexal regions are unremarkable.  Other:  No evidence of abdominal wall fluid collection or mass.  Musculoskeletal:  No suspicious bone lesions identified.  IMPRESSION: No acute findings.  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 In patient without history of cancer, this is most likely benign. Recommend followup imaging with the abdomen MRI without and with contrast in 6 months. This recommendation follows ACR consensus guidelines: White Paper of the ACR Incidental Findings Committee II on Splenic and Nodal Findings. [place] [phone].   Electronically Signed   By: Earle Gell M.D.   On: 07/03/2014 12:36     EKG Interpretation None      MDM   Final diagnoses:  RLQ abdominal pain  Open wound of abdomen, initial encounter    10:10 AM Labs and urinalysis pending. Vitals stable and patient afebrile. Patient will have morphine and zofran.   1:49 PM Patient's urinalysis and labs unremarkable for acute changes. Vitals stable and patient afebrile. Patient has splenic mass on CT. Patient made aware of splenic mass and will follow up with PCP for follow up. Patient will have bactrim and keflex for wound and [facility].    Alvina Chou, PA-C 07/04/14 2159  Quintella Reichert, MD 07/06/14 1452

## 2014-07-03 NOTE — ED Notes (Signed)
Wound care at home by family.

## 2014-07-03 NOTE — ED Notes (Addendum)
She states she underwent abd. Hysterectomy early summer this year.  Here today with c/o low abd. Sm incision used by "robot" similar to laparoscope-type incision "popped open Friday and white and dark stuff came out".  She is in no distress.  The area is ~ the size of a dime, is open and red, and is, at present without drainage.

## 2014-07-03 NOTE — Discharge Instructions (Signed)
Take Keflex and bactrim as directed until gone. Take Vicodin as needed for pain. Refer to attached documents for more information. Follow up with [facility] for further evaluation.

## 2014-08-06 ENCOUNTER — Other Ambulatory Visit: Payer: Self-pay | Admitting: Gastroenterology

## 2014-08-06 DIAGNOSIS — R1084 Generalized abdominal pain: Secondary | ICD-10-CM

## 2014-08-06 DIAGNOSIS — D739 Disease of spleen, unspecified: Secondary | ICD-10-CM

## 2014-08-06 DIAGNOSIS — D7389 Other diseases of spleen: Secondary | ICD-10-CM

## 2014-08-11 ENCOUNTER — Ambulatory Visit
Admission: RE | Admit: 2014-08-11 | Discharge: 2014-08-11 | Disposition: A | Discharge status: Home / Self Care | Payer: 59 | Source: Ambulatory Visit | Attending: Gastroenterology | Admitting: Gastroenterology

## 2014-08-11 DIAGNOSIS — D739 Disease of spleen, unspecified: Secondary | ICD-10-CM

## 2014-08-11 DIAGNOSIS — D7389 Other diseases of spleen: Secondary | ICD-10-CM

## 2014-08-11 DIAGNOSIS — R1084 Generalized abdominal pain: Secondary | ICD-10-CM

## 2014-08-11 MED ORDER — GADOBENATE DIMEGLUMINE 529 MG/ML IV SOLN
15.0000 mL | Freq: Once | INTRAVENOUS | Status: AC | PRN
  Administered 2014-08-11: 15 mL via INTRAVENOUS

## 2014-08-15 ENCOUNTER — Encounter (HOSPITAL_BASED_OUTPATIENT_CLINIC_OR_DEPARTMENT_OTHER): Payer: 59 | Attending: Internal Medicine

## 2014-08-15 DIAGNOSIS — T8189XA Other complications of procedures, not elsewhere classified, initial encounter: Secondary | ICD-10-CM | POA: Insufficient documentation

## 2014-08-15 DIAGNOSIS — B9689 Other specified bacterial agents as the cause of diseases classified elsewhere: Secondary | ICD-10-CM | POA: Diagnosis not present

## 2014-08-15 DIAGNOSIS — Y839 Surgical procedure, unspecified as the cause of abnormal reaction of the patient, or of later complication, without mention of misadventure at the time of the procedure: Secondary | ICD-10-CM | POA: Diagnosis not present

## 2014-08-15 DIAGNOSIS — S31109A Unspecified open wound of abdominal wall, unspecified quadrant without penetration into peritoneal cavity, initial encounter: Secondary | ICD-10-CM | POA: Insufficient documentation

## 2014-08-16 NOTE — Progress Notes (Signed)
[facility]  NAME:  Leslie Owen, Leslie Owen             ACCOUNT NO.:  [id number]  MEDICAL RECORD NO.:  [id number]      DATE OF BIRTH:  08-13-1980  PHYSICIAN:  Ricard Dillon, M.D. VISIT DATE:  08/15/2014                                  OFFICE VISIT   FACILITY:  [facility].  HISTORY:  This is a 34 year old woman who tells me that she underwent a laparoscopic hysterectomy in May 2015, while in [place] for fibroids.  She has had a previous C-section x2, also surgeries in this area. Apparently, everything healed up fine and this was uneventful.  Towards the end of November, she noted a small open area in her lower abdomen, which was apparently one of the surgical site.  This had purulent drainage.  She went to [facility] ER, there does not appear to have been any cultures of the area that I can see in [employer]  Was subsequently seen by, I believe, her primary physician on July 04, 2014.  Area was described at that point is 1 x 1 cm superficial wound of the central lower abdomen with no surrounding erythema.  She did have a CT scan of the abdomen, which showed no acute findings.  She had a 3.7- cm hypovascular splenic mass.  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  The MRI which was done on August 11, 2014, shows no other abnormalities.  The patient has been using peroxide and Neosporin to this area covering with a Band-Aid.  She describes abdominal discomfort, which makes it difficult for her to find a comfortable situation at work to sit in.  She describes a burning-type pain.  PAST MEDICAL HISTORY:  Includes: 1. Anemia. 2. Uterine fibroids, status post laparoscopic hysterectomy. 3. Asymptomatic gallstones.  SURGICAL HISTORY:  Myomectomy, C-section for preterm labor x2, hysterectomy and bladder sling in May 2015.  CURRENT MEDICATIONS:  Only ibuprofen.  PHYSICAL EXAMINATION:  On examination, temperature is 98, pulse  76, respirations 14, blood pressure 113/68.  The area in question was much as described over a month ago in her primary care doctor's office, measuring 1.8 x 1.1 x 0.1.  This has a slight later of epithelialization.  I debrided this with a curette of some surface slough.  There is no evidence of infection.  She is diffusely tender especially in the periumbilical area, but there is no guarding or rebound.  Bowel sounds are positive.  I cannot connect any discomfort she has with this wound, which seems very superficial and benign.  As mentioned, she had a recent MRI that was negative.  IMPRESSION:  Surgical wound, nonhealing.  We applied collagen, Hydrogel under a foam wrap, gave the patient enough to change this.  We will see her again in a week.  I find her abdominal discomfort puzzling, but her exam is reasonably benign.  It may be that she has adhesions, but I do not think this has anything to do with her current abdominal wound.          ______________________________ Ricard Dillon, M.D.     MGR/MEDQ  D:  08/15/2014  T:  08/16/2014  Job:  [id number]

## 2014-08-22 DIAGNOSIS — S31109A Unspecified open wound of abdominal wall, unspecified quadrant without penetration into peritoneal cavity, initial encounter: Secondary | ICD-10-CM | POA: Diagnosis not present

## 2014-08-22 DIAGNOSIS — T8189XA Other complications of procedures, not elsewhere classified, initial encounter: Secondary | ICD-10-CM | POA: Diagnosis not present

## 2014-08-22 DIAGNOSIS — B9689 Other specified bacterial agents as the cause of diseases classified elsewhere: Secondary | ICD-10-CM | POA: Diagnosis not present

## 2014-08-29 ENCOUNTER — Encounter (HOSPITAL_BASED_OUTPATIENT_CLINIC_OR_DEPARTMENT_OTHER): Payer: 59 | Attending: Internal Medicine

## 2014-08-29 DIAGNOSIS — S31109A Unspecified open wound of abdominal wall, unspecified quadrant without penetration into peritoneal cavity, initial encounter: Secondary | ICD-10-CM | POA: Insufficient documentation

## 2014-08-29 DIAGNOSIS — T8189XA Other complications of procedures, not elsewhere classified, initial encounter: Secondary | ICD-10-CM | POA: Diagnosis not present

## 2014-08-29 DIAGNOSIS — Y839 Surgical procedure, unspecified as the cause of abnormal reaction of the patient, or of later complication, without mention of misadventure at the time of the procedure: Secondary | ICD-10-CM | POA: Diagnosis not present

## 2014-09-05 DIAGNOSIS — T8189XA Other complications of procedures, not elsewhere classified, initial encounter: Secondary | ICD-10-CM | POA: Diagnosis not present

## 2014-09-05 DIAGNOSIS — S31109A Unspecified open wound of abdominal wall, unspecified quadrant without penetration into peritoneal cavity, initial encounter: Secondary | ICD-10-CM | POA: Diagnosis not present

## 2014-09-10 ENCOUNTER — Encounter (HOSPITAL_COMMUNITY): Payer: Self-pay | Admitting: *Deleted

## 2014-09-10 ENCOUNTER — Emergency Department (HOSPITAL_COMMUNITY): Payer: 59

## 2014-09-10 ENCOUNTER — Emergency Department (HOSPITAL_COMMUNITY)
Admission: EM | Admit: 2014-09-10 | Discharge: 2014-09-10 | Disposition: A | Discharge status: Home / Self Care | Payer: 59 | Attending: Emergency Medicine | Admitting: Emergency Medicine

## 2014-09-10 DIAGNOSIS — Z7901 Long term (current) use of anticoagulants: Secondary | ICD-10-CM | POA: Insufficient documentation

## 2014-09-10 DIAGNOSIS — I2782 Chronic pulmonary embolism: Secondary | ICD-10-CM | POA: Insufficient documentation

## 2014-09-10 DIAGNOSIS — Z8719 Personal history of other diseases of the digestive system: Secondary | ICD-10-CM | POA: Insufficient documentation

## 2014-09-10 DIAGNOSIS — Z87891 Personal history of nicotine dependence: Secondary | ICD-10-CM | POA: Insufficient documentation

## 2014-09-10 DIAGNOSIS — R079 Chest pain, unspecified: Secondary | ICD-10-CM | POA: Insufficient documentation

## 2014-09-10 DIAGNOSIS — Z862 Personal history of diseases of the blood and blood-forming organs and certain disorders involving the immune mechanism: Secondary | ICD-10-CM | POA: Diagnosis not present

## 2014-09-10 DIAGNOSIS — R0602 Shortness of breath: Secondary | ICD-10-CM | POA: Diagnosis present

## 2014-09-10 DIAGNOSIS — Z8742 Personal history of other diseases of the female genital tract: Secondary | ICD-10-CM | POA: Diagnosis not present

## 2014-09-10 LAB — BASIC METABOLIC PANEL
Anion gap: 7 (ref 5–15)
BUN: 10 mg/dL (ref 6–23)
CALCIUM: 9.1 mg/dL (ref 8.4–10.5)
CO2: 22 mmol/L (ref 19–32)
Chloride: 110 mmol/L (ref 96–112)
Creatinine, Ser: 0.88 mg/dL (ref 0.50–1.10)
GFR calc Af Amer: >90 mL/min (ref >90)
GFR, EST NON AFRICAN AMERICAN: 87 mL/min — AB (ref >90)
GLUCOSE: 141 mg/dL — AB (ref 70–99)
Potassium: 3.8 mmol/L (ref 3.5–5.1)
Sodium: 139 mmol/L (ref 135–145)

## 2014-09-10 LAB — CBC
HCT: 40.2 % (ref 36.0–46.0)
HEMOGLOBIN: 13.2 g/dL (ref 12.0–15.0)
MCH: 29.4 pg (ref 26.0–34.0)
MCHC: 32.8 g/dL (ref 30.0–36.0)
MCV: 89.5 fL (ref 78.0–100.0)
Platelets: 284 10*3/uL (ref 150–400)
RBC: 4.49 MIL/uL (ref 3.87–5.11)
RDW: 13.1 % (ref 11.5–15.5)
WBC: 4.4 10*3/uL (ref 4.0–10.5)

## 2014-09-10 LAB — I-STAT TROPONIN, ED: TROPONIN I, POC: 0 ng/mL (ref 0.00–0.08)

## 2014-09-10 LAB — I-STAT BETA HCG BLOOD, ED (MC, WL, AP ONLY): I-stat hCG, quantitative: <5 m[IU]/mL (ref <5)

## 2014-09-10 LAB — D-DIMER, QUANTITATIVE (NOT AT ARMC): D DIMER QUANT: 0.97 ug{FEU}/mL — AB (ref 0.00–0.48)

## 2014-09-10 LAB — BRAIN NATRIURETIC PEPTIDE: B NATRIURETIC PEPTIDE 5: 9.3 pg/mL (ref 0.0–100.0)

## 2014-09-10 MED ORDER — XARELTO VTE STARTER PACK 15 & 20 MG PO TBPK: 15.0000 mg | ORAL_TABLET | ORAL | Status: DC

## 2014-09-10 MED ORDER — MORPHINE SULFATE 4 MG/ML IJ SOLN
4.0000 mg | Freq: Once | INTRAMUSCULAR | Status: AC
  Administered 2014-09-10: 4 mg via INTRAVENOUS
  Filled 2014-09-10: qty 1

## 2014-09-10 MED ORDER — ONDANSETRON HCL 4 MG/2ML IJ SOLN
4.0000 mg | Freq: Once | INTRAMUSCULAR | Status: AC
  Administered 2014-09-10: 4 mg via INTRAVENOUS
  Filled 2014-09-10: qty 2

## 2014-09-10 MED ORDER — IOHEXOL 350 MG/ML SOLN
100.0000 mL | Freq: Once | INTRAVENOUS | Status: AC | PRN
  Administered 2014-09-10: 100 mL via INTRAVENOUS

## 2014-09-10 MED ORDER — WARFARIN SODIUM 5 MG PO TABS: 10.0000 mg | ORAL_TABLET | Freq: Every day | ORAL | Status: DC

## 2014-09-10 MED ORDER — ENOXAPARIN SODIUM 100 MG/ML ~~LOC~~ SOLN: 1.0000 mg/kg | Freq: Two times a day (BID) | SUBCUTANEOUS | Status: DC

## 2014-09-10 MED ORDER — HYDROCODONE-ACETAMINOPHEN 5-325 MG PO TABS: ORAL_TABLET | ORAL | Status: DC

## 2014-09-10 NOTE — ED Provider Notes (Signed)
CSN: [id number]     Arrival date & time 09/10/14  1147 History   First MD Initiated Contact with Patient 09/10/14 1228     Chief Complaint  Patient presents with  . left side pain   . Shoulder Pain  . Shortness of Breath     (Consider location/radiation/quality/duration/timing/severity/associated sxs/prior Treatment) HPI   Leslie Owen is a 34 y.o. female complaining of acute onset of left-sided chest pain and left shoulder pain last night, pain is 510 at its baseline but worse with inspiration and palpation. Patient denies trauma, heavy lifting, abnormally vigorous exercise, history of DVT or PE, hemoptysis. She endorses dyspnea on exertion and dry cough Past Medical History  Diagnosis Date  . Ulcer   . Encounter for diagnostic endoscopy   . Uterine fibroids affecting pregnancy   . Gall bladder stones   . Anemia    Past Surgical History  Procedure Laterality Date  . Cesarean section    . Myomectomy    . Laporoscopy    . Ovarian cysts    . Wisdom tooth extraction     Family History  Problem Relation Age of Onset  . Arthritis Mother   . Hypertension Mother   . Heart disease Father   . Cancer Maternal Grandmother    History  Substance Use Topics  . Smoking status: Former Smoker -- 0.25 packs/day for .5 years    Types: Cigarettes    Quit date: 10/09/2001  . Smokeless tobacco: Not on file  . Alcohol Use: No   OB History    Gravida Para Term Preterm AB TAB SAB Ectopic Multiple Living   3 2 1 1      2      Review of Systems    Allergies  Review of patient's allergies indicates no known allergies.  Home Medications   Prior to Admission medications   Medication Sig Start Date End Date Taking? Authorizing Provider  ibuprofen (ADVIL,MOTRIN) 200 MG tablet Take 400 mg by mouth every 6 (six) hours as needed for fever, headache or moderate pain.   Yes Historical Provider, MD  cephALEXin (KEFLEX) 500 MG capsule Take 1 capsule (500 mg total) by mouth 4 (four) times  daily. Patient not taking: Reported on 09/10/2014 07/03/14   Alvina Chou, PA-C  enoxaparin (LOVENOX) 100 MG/ML injection Inject 0.7 mLs (70 mg total) into the skin every 12 (twelve) hours. 09/10/14   Debby Freiberg, MD  HYDROcodone-acetaminophen (NORCO/VICODIN) 5-325 MG per tablet Take 1-2 tablets by mouth every 6 hours as needed for pain. 09/10/14   Dazani Norby, PA-C  sulfamethoxazole-trimethoprim (SEPTRA DS) 800-160 MG per tablet Take 1 tablet by mouth every 12 (twelve) hours. Patient not taking: Reported on 09/10/2014 07/03/14   Alvina Chou, PA-C  warfarin (COUMADIN) 5 MG tablet Take 2 tablets (10 mg total) by mouth daily. 10mg  once daily x 3 days then per your doctor 09/10/14   Debby Freiberg, MD   BP 92/62 mmHg  Pulse 81  Temp(Src) 98.1 F (36.7 C) (Oral)  Resp 18  SpO2 98% Physical Exam  Skin:       ED Course  Procedures (including critical care time) Labs Review Labs Reviewed  BASIC METABOLIC PANEL - Abnormal; Notable for the following:    Glucose, Bld 141 (*)    GFR calc non Af Amer 87 (*)    All other components within normal limits  D-DIMER, QUANTITATIVE - Abnormal; Notable for the following:    D-Dimer, Quant 0.97 (*)    All other  components within normal limits  CBC  BRAIN NATRIURETIC PEPTIDE  I-STAT TROPOININ, ED  I-STAT BETA HCG BLOOD, ED ([facility], [facility], [facility] ONLY)    Imaging Review Dg Chest 2 View  09/10/2014   CLINICAL DATA:  34 year old female with pain beneath the left breast since last night. Chest pain. Initial encounter.  EXAM: CHEST  2 VIEW  COMPARISON:  12/09/2009 and earlier.  FINDINGS: Stable scoliosis. Normal cardiac size and mediastinal contours. Visualized tracheal air column is within normal limits. Low normal lung volumes. The lungs remain clear. No pneumothorax or effusion. Stable cholecystectomy clips.  IMPRESSION: No acute cardiopulmonary abnormality.   Electronically Signed   By: Genevie Ann M.D.   On: 09/10/2014 13:52   Ct Angio Chest Pe W/cm  &/or Wo Cm  09/10/2014   CLINICAL DATA:  Left-sided chest pain and shortness of breath for 1 day  EXAM: CT ANGIOGRAPHY CHEST WITH CONTRAST  TECHNIQUE: Multidetector CT imaging of the chest was performed using the standard protocol during bolus administration of intravenous contrast. Multiplanar CT image reconstructions and MIPs were obtained to evaluate the vascular anatomy.  CONTRAST:  151mL OMNIPAQUE IOHEXOL 350 MG/ML SOLN  COMPARISON:  Plain film from earlier in the same day  FINDINGS: The lungs are well aerated bilaterally. Mild dependent atelectatic changes are seen. No focal parenchymal nodule, effusion or pneumothorax is noted.  The thoracic inlet is within normal limits. The thoracic aorta its branches are unremarkable. No aneurysmal dilatation or dissection is identified. The pulmonary artery is well visualized and demonstrates a normal branching pattern. A few small adherent filling defects are identified within the lower lobe pulmonary arterial branches on the right. These likely represent chronic recanalized pulmonary emboli. No large acute pulmonary embolus is identified. No hilar or mediastinal adenopathy is noted.  The upper abdomen shows no acute abnormality. The bony structures are within normal limits.  Review of the MIP images confirms the above findings.  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 No acute pulmonary embolus is noted.  No acute abnormality is noted.   Electronically Signed   By: Inez Catalina M.D.   On: 09/10/2014 14:45     EKG Interpretation None      MDM   Final diagnoses:  Chest pain  Chronic pulmonary embolism    Filed Vitals:   09/10/14 1156 09/10/14 1527  BP: 113/68 92/62  Pulse: 97 81  Temp: 98.2 F (36.8 C) 98.1 F (36.7 C)  TempSrc: Oral Oral  Resp: 16 18  SpO2: 99% 98%    Medications  morphine 4 MG/ML injection 4 mg (4 mg Intravenous Given 09/10/14 1330)  ondansetron (ZOFRAN)  injection 4 mg (4 mg Intravenous Given 09/10/14 1330)  iohexol (OMNIPAQUE) 350 MG/ML injection 100 mL (100 mLs Intravenous Contrast Given 09/10/14 1427)    Leslie Owen is a pleasant 34 y.o. female presenting with left anterior chest and left shoulder pain associated with shortness of breath and dry cough. Patient's lung sounds are clear to auscultation bilaterally. EKG is nonischemic with sinus tachycardia, troponin is negative. Chest x-ray is clear. D-dimer is elevated at 0.97. CT reveals a chronic right-sided PE. Patient will be started on Lovenox and Coumadin as per patient preference after discussion of Xarelto versus traditional treatment by Dr. Colin Rhein.  This is a shared visit with the attending physician who personally evaluated the patient and agrees with the care plan.   Evaluation does not show pathology that would require ongoing emergent intervention  or inpatient treatment. Pt is hemodynamically stable and mentating appropriately. Discussed findings and plan with patient/guardian, who agrees with care plan. All questions answered. Return precautions discussed and outpatient follow up given.   New Prescriptions   ENOXAPARIN (LOVENOX) 100 MG/ML INJECTION    Inject 0.7 mLs (70 mg total) into the skin every 12 (twelve) hours.   HYDROCODONE-ACETAMINOPHEN (NORCO/VICODIN) 5-325 MG PER TABLET    Take 1-2 tablets by mouth every 6 hours as needed for pain.   WARFARIN (COUMADIN) 5 MG TABLET    Take 2 tablets (10 mg total) by mouth daily. 10mg  once daily x 3 days then per your doctor         Monico Blitz, PA-C 09/10/14 1544  Debby Freiberg, MD 09/13/14 [id number]

## 2014-09-10 NOTE — ED Notes (Signed)
Pt reports left sided pain and left shoulder pain with SOB with ambulation since last night. Pain 5/10. Able to speak in full sentences.

## 2014-09-10 NOTE — ED Notes (Signed)
Pt transported to CT via stretcher with tech.  

## 2014-09-10 NOTE — Discharge Instructions (Signed)
Please follow with your primary care doctor in the next 2 days for a check-up. They must obtain records for further management.  ° °Do not hesitate to return to the Emergency Department for any new, worsening or concerning symptoms.  ° °Take vicodin for breakthrough pain, do not drink alcohol, drive, care for children or do other critical tasks while taking vicodin. ° °

## 2014-09-14 ENCOUNTER — Encounter (HOSPITAL_COMMUNITY): Payer: Self-pay | Admitting: Emergency Medicine

## 2014-09-14 ENCOUNTER — Emergency Department (HOSPITAL_COMMUNITY)
Admission: EM | Admit: 2014-09-14 | Discharge: 2014-09-14 | Disposition: A | Discharge status: Home / Self Care | Payer: 59 | Attending: Emergency Medicine | Admitting: Emergency Medicine

## 2014-09-14 ENCOUNTER — Emergency Department (HOSPITAL_COMMUNITY): Payer: 59

## 2014-09-14 DIAGNOSIS — Z87891 Personal history of nicotine dependence: Secondary | ICD-10-CM | POA: Diagnosis not present

## 2014-09-14 DIAGNOSIS — Z862 Personal history of diseases of the blood and blood-forming organs and certain disorders involving the immune mechanism: Secondary | ICD-10-CM | POA: Insufficient documentation

## 2014-09-14 DIAGNOSIS — Z8719 Personal history of other diseases of the digestive system: Secondary | ICD-10-CM | POA: Diagnosis not present

## 2014-09-14 DIAGNOSIS — Z3202 Encounter for pregnancy test, result negative: Secondary | ICD-10-CM | POA: Diagnosis not present

## 2014-09-14 DIAGNOSIS — R0789 Other chest pain: Secondary | ICD-10-CM | POA: Insufficient documentation

## 2014-09-14 DIAGNOSIS — R079 Chest pain, unspecified: Secondary | ICD-10-CM | POA: Diagnosis present

## 2014-09-14 DIAGNOSIS — Z7901 Long term (current) use of anticoagulants: Secondary | ICD-10-CM | POA: Diagnosis not present

## 2014-09-14 DIAGNOSIS — R0602 Shortness of breath: Secondary | ICD-10-CM | POA: Diagnosis not present

## 2014-09-14 DIAGNOSIS — R0781 Pleurodynia: Secondary | ICD-10-CM

## 2014-09-14 DIAGNOSIS — M7989 Other specified soft tissue disorders: Secondary | ICD-10-CM | POA: Diagnosis not present

## 2014-09-14 LAB — BASIC METABOLIC PANEL
ANION GAP: 7 (ref 5–15)
BUN: 7 mg/dL (ref 6–23)
CHLORIDE: 108 mmol/L (ref 96–112)
CO2: 23 mmol/L (ref 19–32)
Calcium: 9.1 mg/dL (ref 8.4–10.5)
Creatinine, Ser: 0.87 mg/dL (ref 0.50–1.10)
GFR calc non Af Amer: 88 mL/min — ABNORMAL LOW (ref >90)
Glucose, Bld: 131 mg/dL — ABNORMAL HIGH (ref 70–99)
Potassium: 3.6 mmol/L (ref 3.5–5.1)
SODIUM: 138 mmol/L (ref 135–145)

## 2014-09-14 LAB — CBC
HCT: 39.2 % (ref 36.0–46.0)
HEMOGLOBIN: 12.7 g/dL (ref 12.0–15.0)
MCH: 28.9 pg (ref 26.0–34.0)
MCHC: 32.4 g/dL (ref 30.0–36.0)
MCV: 89.3 fL (ref 78.0–100.0)
PLATELETS: 268 10*3/uL (ref 150–400)
RBC: 4.39 MIL/uL (ref 3.87–5.11)
RDW: 13.1 % (ref 11.5–15.5)
WBC: 4.7 10*3/uL (ref 4.0–10.5)

## 2014-09-14 LAB — PROTIME-INR
INR: 1.7 — AB (ref 0.00–1.49)
PROTHROMBIN TIME: 20.1 s — AB (ref 11.6–15.2)

## 2014-09-14 LAB — I-STAT TROPONIN, ED: TROPONIN I, POC: 0 ng/mL (ref 0.00–0.08)

## 2014-09-14 LAB — POC URINE PREG, ED: Preg Test, Ur: NEGATIVE

## 2014-09-14 LAB — BRAIN NATRIURETIC PEPTIDE: B NATRIURETIC PEPTIDE 5: 25.3 pg/mL (ref 0.0–100.0)

## 2014-09-14 MED ORDER — METHOCARBAMOL 500 MG PO TABS: 500.0000 mg | ORAL_TABLET | Freq: Two times a day (BID) | ORAL | Status: DC

## 2014-09-14 MED ORDER — OXYCODONE-ACETAMINOPHEN 5-325 MG PO TABS: 1.0000 | ORAL_TABLET | ORAL | Status: DC | PRN

## 2014-09-14 MED ORDER — MORPHINE SULFATE 4 MG/ML IJ SOLN
4.0000 mg | Freq: Once | INTRAMUSCULAR | Status: AC
  Administered 2014-09-14: 4 mg via INTRAVENOUS
  Filled 2014-09-14: qty 1

## 2014-09-14 NOTE — Progress Notes (Signed)
VASCULAR LAB PRELIMINARY  PRELIMINARY  PRELIMINARY  PRELIMINARY  Left lower extremity venous Doppler completed.    Preliminary report:  There is no DVT or SVT noted in the left lower extremity.   Aundraya Dripps, RVT 09/14/2014, 4:00 PM

## 2014-09-14 NOTE — ED Notes (Signed)
Pt states that she was seen here on Tuesday and was diagnosed with PE.  Pt states that she was told to come back if pain gets any worse.  Pt c/o central chest pain that radiaates to left rib cage area and left arm that started this morning.

## 2014-09-14 NOTE — Discharge Instructions (Signed)

## 2014-09-14 NOTE — ED Provider Notes (Signed)
CSN: [id number]     Arrival date & time 09/14/14  1304 History   First MD Initiated Contact with Patient 09/14/14 1342     Chief Complaint  Patient presents with  . Pulmonary Emboli   . Shortness of Breath  . Chest Pain     (Consider location/radiation/quality/duration/timing/severity/associated sxs/prior Treatment) HPI Comments: Patient here complaining of worsening left-sided chest pain 5 days. 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 Left-sided chest pain characterized as sharp and pleuritic with some associated dyspnea and no diaphoresis nausea or vomiting. She also notes left-sided leg swelling. Denies any recent travel history. No fever or chills. No cough or congestion. Has been using her home medication without relief  Patient is a 34 y.o. female presenting with shortness of breath and chest pain. The history is provided by the patient.  Shortness of Breath Associated symptoms: chest pain   Chest Pain Associated symptoms: shortness of breath     Past Medical History  Diagnosis Date  . Ulcer   . Encounter for diagnostic endoscopy   . Uterine fibroids affecting pregnancy   . Gall bladder stones   . Anemia    Past Surgical History  Procedure Laterality Date  . Cesarean section    . Myomectomy    . Laporoscopy    . Ovarian cysts    . Wisdom tooth extraction     Family History  Problem Relation Age of Onset  . Arthritis Mother   . Hypertension Mother   . Heart disease Father   . Cancer Maternal Grandmother    History  Substance Use Topics  . Smoking status: Former Smoker -- 0.25 packs/day for .5 years    Types: Cigarettes    Quit date: 10/09/2001  . Smokeless tobacco: Not on file  . Alcohol Use: No   OB History    Gravida Para Term Preterm AB TAB SAB Ectopic Multiple Living   3 2 1 1      2      Review of Systems  Respiratory: Positive for shortness  of breath.   Cardiovascular: Positive for chest pain.  All other systems reviewed and are negative.     Allergies  Review of patient's allergies indicates no known allergies.  Home Medications   Prior to Admission medications   Medication Sig Start Date End Date Taking? Authorizing Provider  enoxaparin (LOVENOX) 100 MG/ML injection Inject 0.7 mLs (70 mg total) into the skin every 12 (twelve) hours. 09/10/14  Yes Debby Freiberg, MD  HYDROcodone-acetaminophen (NORCO/VICODIN) 5-325 MG per tablet Take 1-2 tablets by mouth every 6 hours as needed for pain. 09/10/14  Yes Nicole Pisciotta, PA-C  warfarin (COUMADIN) 5 MG tablet Take 2 tablets (10 mg total) by mouth daily. 10mg  once daily x 3 days then per your doctor 09/10/14  Yes Debby Freiberg, MD  cephALEXin (KEFLEX) 500 MG capsule Take 1 capsule (500 mg total) by mouth 4 (four) times daily. Patient not taking: Reported on 09/10/2014 07/03/14   Alvina Chou, PA-C  sulfamethoxazole-trimethoprim (SEPTRA DS) 800-160 MG per tablet Take 1 tablet by mouth every 12 (twelve) hours. Patient not taking: Reported on 09/10/2014 07/03/14   Alvina Chou, PA-C   BP 118/72 mmHg  Pulse 86  Temp(Src) 98.6 F (37 C) (Oral)  Resp 19  SpO2 100% Physical Exam  Constitutional: She is oriented to person, place, and time. She appears well-developed and well-nourished.  Non-toxic  appearance. No distress.  HENT:  Head: Normocephalic and atraumatic.  Eyes: Conjunctivae, EOM and lids are normal. Pupils are equal, round, and reactive to light.  Neck: Normal range of motion. Neck supple. No tracheal deviation present. No thyroid mass present.  Cardiovascular: Normal rate, regular rhythm and normal heart sounds.  Exam reveals no gallop.   No murmur heard. Pulmonary/Chest: Effort normal and breath sounds normal. No stridor. No respiratory distress. She has no decreased breath sounds. She has no wheezes. She has no rhonchi. She has no rales.  Abdominal: Soft.  Normal appearance and bowel sounds are normal. She exhibits no distension. There is no tenderness. There is no rebound and no CVA tenderness.  Musculoskeletal: Normal range of motion. She exhibits no edema or tenderness.  Neurological: She is alert and oriented to person, place, and time. She has normal strength. No cranial nerve deficit or sensory deficit. GCS eye subscore is 4. GCS verbal subscore is 5. GCS motor subscore is 6.  Skin: Skin is warm and dry. No abrasion and no rash noted.  Psychiatric: She has a normal mood and affect. Her speech is normal and behavior is normal.  Nursing note and vitals reviewed.   ED Course  Procedures (including critical care time) Labs Review Labs Reviewed  PROTIME-INR - Abnormal; Notable for the following:    Prothrombin Time 20.1 (*)    INR 1.70 (*)    All other components within normal limits  CBC  BASIC METABOLIC PANEL  BRAIN NATRIURETIC PEPTIDE  I-STAT TROPOININ, ED  POC URINE PREG, ED    Imaging Review No results found.   EKG Interpretation   Date/Time:  Saturday September 14 2014 13:12:45 EST Ventricular Rate:  85 PR Interval:  153 QRS Duration: 67 QT Interval:  349 QTC Calculation: 415 R Axis:   72 Text Interpretation:  Sinus rhythm No significant change since last  tracing Reconfirmed by April Carlyon  MD, Medhansh Brinkmeier ([id number]) on 09/14/2014 2:05:53 PM      MDM   Final diagnoses:  None    Patient given pain medication and had a negative Doppler for lower extremity. She is almost therapeutic on her PT and is on Lovenox as well. Will give stronger pain medicine discharge    Leota Jacobsen, MD 09/14/14 [phone]

## 2014-09-27 ENCOUNTER — Emergency Department (HOSPITAL_COMMUNITY)
Admission: EM | Admit: 2014-09-27 | Discharge: 2014-09-27 | Disposition: A | Discharge status: Home / Self Care | Payer: 59 | Attending: Emergency Medicine | Admitting: Emergency Medicine

## 2014-09-27 ENCOUNTER — Emergency Department (HOSPITAL_COMMUNITY): Payer: 59

## 2014-09-27 ENCOUNTER — Encounter (HOSPITAL_COMMUNITY): Payer: Self-pay

## 2014-09-27 DIAGNOSIS — Z791 Long term (current) use of non-steroidal anti-inflammatories (NSAID): Secondary | ICD-10-CM | POA: Insufficient documentation

## 2014-09-27 DIAGNOSIS — Z87891 Personal history of nicotine dependence: Secondary | ICD-10-CM | POA: Insufficient documentation

## 2014-09-27 DIAGNOSIS — Z862 Personal history of diseases of the blood and blood-forming organs and certain disorders involving the immune mechanism: Secondary | ICD-10-CM | POA: Diagnosis not present

## 2014-09-27 DIAGNOSIS — Z8719 Personal history of other diseases of the digestive system: Secondary | ICD-10-CM | POA: Insufficient documentation

## 2014-09-27 DIAGNOSIS — M549 Dorsalgia, unspecified: Secondary | ICD-10-CM | POA: Insufficient documentation

## 2014-09-27 DIAGNOSIS — R0602 Shortness of breath: Secondary | ICD-10-CM | POA: Diagnosis not present

## 2014-09-27 DIAGNOSIS — Z86711 Personal history of pulmonary embolism: Secondary | ICD-10-CM | POA: Diagnosis not present

## 2014-09-27 DIAGNOSIS — Z8742 Personal history of other diseases of the female genital tract: Secondary | ICD-10-CM | POA: Insufficient documentation

## 2014-09-27 DIAGNOSIS — Z872 Personal history of diseases of the skin and subcutaneous tissue: Secondary | ICD-10-CM | POA: Diagnosis not present

## 2014-09-27 DIAGNOSIS — Z79899 Other long term (current) drug therapy: Secondary | ICD-10-CM | POA: Insufficient documentation

## 2014-09-27 DIAGNOSIS — R0789 Other chest pain: Secondary | ICD-10-CM | POA: Insufficient documentation

## 2014-09-27 DIAGNOSIS — R079 Chest pain, unspecified: Secondary | ICD-10-CM | POA: Diagnosis present

## 2014-09-27 DIAGNOSIS — Z7901 Long term (current) use of anticoagulants: Secondary | ICD-10-CM | POA: Insufficient documentation

## 2014-09-27 DIAGNOSIS — R0781 Pleurodynia: Secondary | ICD-10-CM

## 2014-09-27 HISTORY — DX: Other pulmonary embolism without acute cor pulmonale: I26.99

## 2014-09-27 LAB — CBC WITH DIFFERENTIAL/PLATELET
BASOS ABS: 0 10*3/uL (ref 0.0–0.1)
BASOS PCT: 0 % (ref 0–1)
Eosinophils Absolute: 0.1 10*3/uL (ref 0.0–0.7)
Eosinophils Relative: 2 % (ref 0–5)
HEMATOCRIT: 40.4 % (ref 36.0–46.0)
HEMOGLOBIN: 13 g/dL (ref 12.0–15.0)
Lymphocytes Relative: 38 % (ref 12–46)
Lymphs Abs: 1.8 10*3/uL (ref 0.7–4.0)
MCH: 28.6 pg (ref 26.0–34.0)
MCHC: 32.2 g/dL (ref 30.0–36.0)
MCV: 89 fL (ref 78.0–100.0)
MONO ABS: 0.5 10*3/uL (ref 0.1–1.0)
Monocytes Relative: 10 % (ref 3–12)
Neutro Abs: 2.4 10*3/uL (ref 1.7–7.7)
Neutrophils Relative %: 50 % (ref 43–77)
Platelets: 312 10*3/uL (ref 150–400)
RBC: 4.54 MIL/uL (ref 3.87–5.11)
RDW: 13.2 % (ref 11.5–15.5)
WBC: 4.7 10*3/uL (ref 4.0–10.5)

## 2014-09-27 LAB — PROTIME-INR
INR: 2.65 — ABNORMAL HIGH (ref 0.00–1.49)
PROTHROMBIN TIME: 28.5 s — AB (ref 11.6–15.2)

## 2014-09-27 LAB — COMPREHENSIVE METABOLIC PANEL
ALT: 14 U/L (ref 0–35)
AST: 19 U/L (ref 0–37)
Albumin: 4.3 g/dL (ref 3.5–5.2)
Alkaline Phosphatase: 56 U/L (ref 39–117)
Anion gap: 7 (ref 5–15)
BUN: 9 mg/dL (ref 6–23)
CALCIUM: 9 mg/dL (ref 8.4–10.5)
CO2: 25 mmol/L (ref 19–32)
CREATININE: 0.81 mg/dL (ref 0.50–1.10)
Chloride: 105 mmol/L (ref 96–112)
GFR calc Af Amer: >90 mL/min (ref >90)
GLUCOSE: 89 mg/dL (ref 70–99)
Potassium: 3.7 mmol/L (ref 3.5–5.1)
Sodium: 137 mmol/L (ref 135–145)
Total Bilirubin: 0.6 mg/dL (ref 0.3–1.2)
Total Protein: 7.3 g/dL (ref 6.0–8.3)

## 2014-09-27 LAB — TROPONIN I: Troponin I: <0.03 ng/mL (ref <0.031)

## 2014-09-27 MED ORDER — MORPHINE SULFATE 4 MG/ML IJ SOLN
4.0000 mg | Freq: Once | INTRAMUSCULAR | Status: AC
  Administered 2014-09-27: 4 mg via INTRAVENOUS
  Filled 2014-09-27: qty 1

## 2014-09-27 MED ORDER — IOHEXOL 350 MG/ML SOLN
100.0000 mL | Freq: Once | INTRAVENOUS | Status: AC | PRN
  Administered 2014-09-27: 100 mL via INTRAVENOUS

## 2014-09-27 NOTE — ED Provider Notes (Signed)
CSN: [id number]     Arrival date & time 09/27/14  1033 History   First MD Initiated Contact with Patient 09/27/14 1108     Chief Complaint  Patient presents with  . Chest Pain  . Flank Pain     (Consider location/radiation/quality/duration/timing/severity/associated sxs/prior Treatment) HPI Patient complains of sudden onset bilateral drastic back painonset last night last nightN vague tingling in right arm since last night. She also reports intermittent shortness of breath lasting 3 minutes at a time. Nothing makes symptoms better or worse. Patient diagnosed with pulmonary embolism 09/10/2014.(CT angiogram showed chronic recanalized pulmonary embolism and right lower lobe). She was initially started on Lovenox and Lovenox discontinued presently is taking warfarin. No other associated symptoms. No urinary symptoms. No flank pain. She treated herself with Percocet one tablet last night, without relief. No other associated symptoms . Denies nausea. Past Medical History  Diagnosis Date  . Ulcer   . Encounter for diagnostic endoscopy   . Uterine fibroids affecting pregnancy   . Gall bladder stones   . Anemia   . Pulmonary embolus    Past Surgical History  Procedure Laterality Date  . Cesarean section    . Myomectomy    . Laporoscopy    . Ovarian cysts    . Wisdom tooth extraction     Family History  Problem Relation Age of Onset  . Arthritis Mother   . Hypertension Mother   . Heart disease Father   . Cancer Maternal Grandmother    History  Substance Use Topics  . Smoking status: Former Smoker -- 0.25 packs/day for .5 years    Types: Cigarettes    Quit date: 10/09/2001  . Smokeless tobacco: Never Used  . Alcohol Use: No   OB History    Gravida Para Term Preterm AB TAB SAB Ectopic Multiple Living   3 2 1 1      2      Review of Systems  Constitutional: Negative.   HENT: Negative.   Respiratory: Positive for shortness of breath.   Cardiovascular: Negative.    Gastrointestinal: Negative.   Musculoskeletal: Positive for myalgias and back pain.  Skin: Negative.   Neurological: Negative.   Psychiatric/Behavioral: Negative.   All other systems reviewed and are negative.     Allergies  Review of patient's allergies indicates no known allergies.  Home Medications   Prior to Admission medications   Medication Sig Start Date End Date Taking? Authorizing Provider  oxyCODONE-acetaminophen (PERCOCET/ROXICET) 5-325 MG per tablet Take 1-2 tablets by mouth every 4 (four) hours as needed for severe pain. 09/14/14  Yes Leota Jacobsen, MD  warfarin (COUMADIN) 5 MG tablet Take 2 tablets (10 mg total) by mouth daily. 10mg  once daily x 3 days then per your doctor Patient taking differently: Take 7.5 mg by mouth daily.  09/10/14  Yes Debby Freiberg, MD  cephALEXin (KEFLEX) 500 MG capsule Take 1 capsule (500 mg total) by mouth 4 (four) times daily. Patient not taking: Reported on 09/10/2014 07/03/14   Alvina Chou, PA-C  enoxaparin (LOVENOX) 100 MG/ML injection Inject 0.7 mLs (70 mg total) into the skin every 12 (twelve) hours. 09/10/14   Debby Freiberg, MD  methocarbamol (ROBAXIN) 500 MG tablet Take 1 tablet (500 mg total) by mouth 2 (two) times daily. 09/14/14   Leota Jacobsen, MD  sulfamethoxazole-trimethoprim (SEPTRA DS) 800-160 MG per tablet Take 1 tablet by mouth every 12 (twelve) hours. Patient not taking: Reported on 09/10/2014 07/03/14   Alvina Chou, PA-C  BP 123/76 mmHg  Pulse 84  Temp(Src) 98.6 F (37 C) (Oral)  Resp 20  Ht 5\' 3"  (1.6 m)  Wt 160 lb (72.576 kg)  BMI 28.35 kg/m2  SpO2 100% Physical Exam  Constitutional: She appears well-developed and well-nourished.  HENT:  Head: Normocephalic and atraumatic.  Eyes: Conjunctivae are normal. Pupils are equal, round, and reactive to light.  Neck: Neck supple. No tracheal deviation present. No thyromegaly present.  Cardiovascular: Normal rate and regular rhythm.   No murmur  heard. Pulmonary/Chest: Effort normal and breath sounds normal.  Abdominal: Soft. Bowel sounds are normal. She exhibits no distension. There is no tenderness.  Musculoskeletal: Normal range of motion. She exhibits no edema or tenderness.  Neurological: She is alert. Coordination normal.  Skin: Skin is warm and dry. No rash noted.  Psychiatric: She has a normal mood and affect.  Nursing note and vitals reviewed.   ED Course  Procedures (including critical care time) Labs Review Labs Reviewed - No data to display  Imaging Review No results found.   EKG Interpretation   Date/Time:  Friday September 27 2014 10:59:48 EST Ventricular Rate:  73 PR Interval:  164 QRS Duration: 74 QT Interval:  388 QTC Calculation: 427 R Axis:   77 Text Interpretation:  Sinus rhythm Low voltage, precordial leads No  significant change since last tracing Confirmed by Andretta Ergle  MD, Vin Yonke  ([id number]) on 09/27/2014 3:40:46 PM     4 PM pain improved and patient feels ready for discharge after treatment with intravenous opioids. Results for orders placed or performed during the hospital encounter of 09/27/14  Comprehensive metabolic panel  Result Value Ref Range   Sodium 137 135 - 145 mmol/L   Potassium 3.7 3.5 - 5.1 mmol/L   Chloride 105 96 - 112 mmol/L   CO2 25 19 - 32 mmol/L   Glucose, Bld 89 70 - 99 mg/dL   BUN 9 6 - 23 mg/dL   Creatinine, Ser 0.81 0.50 - 1.10 mg/dL   Calcium 9.0 8.4 - 10.5 mg/dL   Total Protein 7.3 6.0 - 8.3 g/dL   Albumin 4.3 3.5 - 5.2 g/dL   AST 19 0 - 37 U/L   ALT 14 0 - 35 U/L   Alkaline Phosphatase 56 39 - 117 U/L   Total Bilirubin 0.6 0.3 - 1.2 mg/dL   GFR calc non Af Amer >90 >90 mL/min   GFR calc Af Amer >90 >90 mL/min   Anion gap 7 5 - 15  CBC with Differential/Platelet  Result Value Ref Range   WBC 4.7 4.0 - 10.5 K/uL   RBC 4.54 3.87 - 5.11 MIL/uL   Hemoglobin 13.0 12.0 - 15.0 g/dL   HCT 40.4 36.0 - 46.0 %   MCV 89.0 78.0 - 100.0 fL   MCH 28.6 26.0 - 34.0 pg    MCHC 32.2 30.0 - 36.0 g/dL   RDW 13.2 11.5 - 15.5 %   Platelets 312 150 - 400 K/uL   Neutrophils Relative % 50 43 - 77 %   Neutro Abs 2.4 1.7 - 7.7 K/uL   Lymphocytes Relative 38 12 - 46 %   Lymphs Abs 1.8 0.7 - 4.0 K/uL   Monocytes Relative 10 3 - 12 %   Monocytes Absolute 0.5 0.1 - 1.0 K/uL   Eosinophils Relative 2 0 - 5 %   Eosinophils Absolute 0.1 0.0 - 0.7 K/uL   Basophils Relative 0 0 - 1 %   Basophils Absolute 0.0 0.0 - 0.1  K/uL  Protime-INR  Result Value Ref Range   Prothrombin Time 28.5 (H) 11.6 - 15.2 seconds   INR 2.65 (H) 0.00 - 1.49  Troponin I  Result Value Ref Range   Troponin I <0.03 <0.031 ng/mL   Dg Chest 2 View  09/10/2014   CLINICAL DATA:  34 year old female with pain beneath the left breast since last night. Chest pain. Initial encounter.  EXAM: CHEST  2 VIEW  COMPARISON:  12/09/2009 and earlier.  FINDINGS: Stable scoliosis. Normal cardiac size and mediastinal contours. Visualized tracheal air column is within normal limits. Low normal lung volumes. The lungs remain clear. No pneumothorax or effusion. Stable cholecystectomy clips.  IMPRESSION: No acute cardiopulmonary abnormality.   Electronically Signed   By: Genevie Ann M.D.   On: 09/10/2014 13:52   Ct Angio Chest Pe W/cm &/or Wo Cm  09/27/2014   CLINICAL DATA:  Intermittent mid chest pain that radiates to the right shoulder and right arm. Symptoms began 2 weeks ago. Shortness of breath and nausea. Diagnosis of pulmonary emboli 2 weeks ago.  EXAM: CT ANGIOGRAPHY CHEST WITH CONTRAST  TECHNIQUE: Multidetector CT imaging of the chest was performed using the standard protocol during bolus administration of intravenous contrast. Multiplanar CT image reconstructions and MIPs were obtained to evaluate the vascular anatomy.  CONTRAST:  179mL OMNIPAQUE IOHEXOL 350 MG/ML SOLN  COMPARISON:  Radiography 09/14/2014.  CT angiography 09/10/2014.  FINDINGS: The aorta is normal.  No coronary artery calcification.  The pulmonary arterial  tree appears normal today. No filling defects are seen. Small or resolving emboli on the previous study are completely lysed and there is no evidence of any new embolic disease.  No pleural or pericardial fluid. No hilar or mediastinal lymphadenopathy.  The lungs are clear except for mild dependent atelectasis at both lung bases.  Scans in the upper abdomen are unremarkable.  No significant spinal or chest wall finding.  Review of the MIP images confirms the above findings.  IMPRESSION: Tiny filling defects in the right lower lobe pulmonary arterial tree on the previous study are no longer visible. These have presumably completely lysed. There are no new pulmonary emboli.  Minimal dependent pulmonary atelectasis at the bases, probably not significant.   Electronically Signed   By: Nelson Chimes M.D.   On: 09/27/2014 15:05   Ct Angio Chest Pe W/cm &/or Wo Cm  09/10/2014   CLINICAL DATA:  Left-sided chest pain and shortness of breath for 1 day  EXAM: CT ANGIOGRAPHY CHEST WITH CONTRAST  TECHNIQUE: Multidetector CT imaging of the chest was performed using the standard protocol during bolus administration of intravenous contrast. Multiplanar CT image reconstructions and MIPs were obtained to evaluate the vascular anatomy.  CONTRAST:  167mL OMNIPAQUE IOHEXOL 350 MG/ML SOLN  COMPARISON:  Plain film from earlier in the same day  FINDINGS: The lungs are well aerated bilaterally. Mild dependent atelectatic changes are seen. No focal parenchymal nodule, effusion or pneumothorax is noted.  The thoracic inlet is within normal limits. The thoracic aorta its branches are unremarkable. No aneurysmal dilatation or dissection is identified. The pulmonary artery is well visualized and demonstrates a normal branching pattern. A few small adherent filling defects are identified within the lower lobe pulmonary arterial branches on the right. These likely represent chronic recanalized pulmonary emboli. No large acute pulmonary embolus  is identified. No hilar or mediastinal adenopathy is noted.  The upper abdomen shows no acute abnormality. The bony structures are within normal limits.  Review of the  MIP images confirms the above findings.  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 No acute pulmonary embolus is noted.  No acute abnormality is noted.   Electronically Signed   By: Inez Catalina M.D.   On: 09/10/2014 14:45   Dg Chest Port 1 View  09/14/2014   CLINICAL DATA:  Short of breath  EXAM: PORTABLE CHEST - 1 VIEW  COMPARISON:  09/10/2014  FINDINGS: The heart size and mediastinal contours are within normal limits. Both lungs are clear. The visualized skeletal structures are unremarkable.  IMPRESSION: No active disease.   Electronically Signed   By: Kerby Moors M.D.   On: 09/14/2014 14:15    MDM  Pain felt to be atypical for acute coronary syndrome.heart score equals 1. No new evidence of pulmonary embolism. She is therapeutic on Coumadin. Final diagnoses:  None  plan : she has Percocet at home she is instructed to take 1-2 every 4 hours as needed for pain. Follow up with Dr. Carol Ada Diagnosis atypical chest pain      Orlie Dakin, MD 09/27/14 [phone]

## 2014-09-27 NOTE — ED Notes (Signed)
Patient states she has intermittent mid chest pain that radiates into the right shoulder,  right arm, and right flank area. Patient states the pain began 2 weeks. Patient also c/o SOB and nausea. Patient states she was diagnosed with a PE 2 weeks ago. Patient states she was taking Lovenox but PCP stopped the Lovenox after taking for 1 week.

## 2014-09-27 NOTE — Discharge Instructions (Signed)
Chest Pain (Nonspecific) Take a Percocet as prescribed, one or 2 tablets every 4 hours as needed for pain. Or you can take Tylenol for mild pain.Please follow up with Dr. Tamala Julian next week and call her office let her know that you came to the emergency department today. She may want to see you in the office. It is often hard to give a diagnosis for the cause of chest pain. There is always a chance that your pain could be related to something serious, such as a heart attack or a blood clot in the lungs. You need to follow up with your doctor. HOME CARE  If antibiotic medicine was given, take it as directed by your doctor. Finish the medicine even if you start to feel better.  For the next few days, avoid activities that bring on chest pain. Continue physical activities as told by your doctor.  Do not use any tobacco products. This includes cigarettes, chewing tobacco, and e-cigarettes.  Avoid drinking alcohol.  Only take medicine as told by your doctor.  Follow your doctor's suggestions for more testing if your chest pain does not go away.  Keep all doctor visits you made. GET HELP IF:  Your chest pain does not go away, even after treatment.  You have a rash with blisters on your chest.  You have a fever. GET HELP RIGHT AWAY IF:   You have more pain or pain that spreads to your arm, neck, jaw, back, or belly (abdomen).  You have shortness of breath.  You cough more than usual or cough up blood.  You have very bad back or belly pain.  You feel sick to your stomach (nauseous) or throw up (vomit).  You have very bad weakness.  You pass out (faint).  You have chills. This is an emergency. Do not wait to see if the problems will go away. Call your local emergency services (911 in [place] Do not drive yourself to the hospital. MAKE SURE YOU:   Understand these instructions.  Will watch your condition.  Will get help right away if you are not doing well or get worse. Document  Released: 12/29/2007 Document Revised: 07/17/2013 Document Reviewed: 12/29/2007 [facility] Patient Information 2015 [place], [place]. This information is not intended to replace advice given to you by your health care provider. Make sure you discuss any questions you have with your health care provider.

## 2014-09-27 NOTE — ED Notes (Signed)
Called CT, was told pt was next to be scanned.

## 2014-10-02 ENCOUNTER — Telehealth: Payer: Self-pay | Admitting: Hematology

## 2014-10-02 ENCOUNTER — Encounter: Payer: Self-pay | Admitting: Hematology

## 2014-10-02 ENCOUNTER — Telehealth: Payer: Self-pay | Admitting: *Deleted

## 2014-10-02 ENCOUNTER — Ambulatory Visit (HOSPITAL_COMMUNITY)
Admission: RE | Admit: 2014-10-02 | Discharge: 2014-10-02 | Disposition: A | Discharge status: Home / Self Care | Payer: 59 | Source: Ambulatory Visit | Attending: Hematology | Admitting: Hematology

## 2014-10-02 ENCOUNTER — Other Ambulatory Visit: Payer: Self-pay | Admitting: *Deleted

## 2014-10-02 ENCOUNTER — Ambulatory Visit: Payer: 59

## 2014-10-02 ENCOUNTER — Ambulatory Visit (HOSPITAL_BASED_OUTPATIENT_CLINIC_OR_DEPARTMENT_OTHER): Payer: 59 | Admitting: Hematology

## 2014-10-02 VITALS — BP 103/70 | HR 85 | Temp 98.6°F | Resp 18 | Ht 63.0 in | Wt 168.6 lb

## 2014-10-02 DIAGNOSIS — M79603 Pain in arm, unspecified: Secondary | ICD-10-CM | POA: Insufficient documentation

## 2014-10-02 DIAGNOSIS — Z86711 Personal history of pulmonary embolism: Secondary | ICD-10-CM | POA: Diagnosis present

## 2014-10-02 DIAGNOSIS — I2699 Other pulmonary embolism without acute cor pulmonale: Secondary | ICD-10-CM

## 2014-10-02 DIAGNOSIS — Z7901 Long term (current) use of anticoagulants: Secondary | ICD-10-CM

## 2014-10-02 MED ORDER — ENOXAPARIN SODIUM 80 MG/0.8ML ~~LOC~~ SOLN: 80.0000 mg | Freq: Two times a day (BID) | SUBCUTANEOUS | Status: DC

## 2014-10-02 NOTE — Progress Notes (Signed)
Left Upper Extremity Venous Duplex Completed. No evidence for DVT or SVT. [place]

## 2014-10-02 NOTE — Telephone Encounter (Signed)
Negative results called from [place] at [facility]. Results given to Thu at Dr. Ernestina Penna desk.

## 2014-10-02 NOTE — Progress Notes (Signed)
[facility]  [phone] [phone]  Clinic New Consult Note   Patient Care Team: Carol Ada, MD as PCP - General (Family Medicine) 10/02/2014  CHIEF COMPLAINTS/PURPOSE OF CONSULTATION:  Pulmonary embolism  HISTORY OF PRESENTING ILLNESS:  Leslie Owen 34 y.o. female is here because of recently diagnosed pulmonary embolism.  She was at her usual health status continue 09/10/2014 when she developed left chest pain, dyspnea and left arm pain. She went to ED and was found to have PE. Doppler of the low extremity was negative for DVT. She was started on lovenox 70mg  q12h for 7 days, and she was transitioned to coumadin. She has been following up with her primary care physician Dr. Thompson Caul office for Coumadin dose adjustment. Her INR was supratherapeutic last week, Coumadin dose was decreased and her INR was 1.7 yesterday.  She developed left-sided chest pain again on 09/27/2014. She went to ED again and a CT of the chest angiogram was negative for PE. The chest pain resolved spontaneously 2 days later. She however noticed pain in the left arm and left leg from yesterday, with mild edema in both extremities, and limited range of motion of the left shoulder. She denies any fever, no injury or fall.  She otherwise is doing well. She works full-time, quite active, takes care of 3 young children at home.  MEDICAL HISTORY:  Past Medical History  Diagnosis Date  . Ulcer   . Encounter for diagnostic endoscopy   . Uterine fibroids affecting pregnancy   . Gall bladder stones   . Anemia   . Pulmonary embolus     SURGICAL HISTORY: Past Surgical History  Procedure Laterality Date  . Cesarean section    . Myomectomy    . Laporoscopy    . Ovarian cysts    . Wisdom tooth extraction      SOCIAL HISTORY: History   Social History  . Marital Status: Married    Spouse Name: N/A  . Number of Children: 3, age of 29, 3, 2   . Years of Education: N/A    Occupational History  . Not on file.   Social History Main Topics  . Smoking status: Former Smoker -- 0.25 packs/day for .5 years    Types: Cigarettes    Quit date: 10/09/2001  . Smokeless tobacco: Never Used  . Alcohol Use: No  . Drug Use: No  . Sexual Activity: Yes   Other Topics Concern  . Not on file   Social History Narrative    FAMILY HISTORY: Family History  Problem Relation Age of Onset  . Arthritis Mother   . Hypertension Mother   . Heart disease Father   . Cancer Maternal Grandmother     ALLERGIES:  has No Known Allergies.  MEDICATIONS:  Current Outpatient Prescriptions  Medication Sig Dispense Refill  . warfarin (COUMADIN) 5 MG tablet Take 2 tablets (10 mg total) by mouth daily. 10mg  once daily x 3 days then per your doctor (Patient taking differently: Take 7.5 mg by mouth daily. Take Coumadin  7.5mg   On  M - W - F ;  5mg   All other days.) 30 tablet 0  . oxyCODONE-acetaminophen (PERCOCET/ROXICET) 5-325 MG per tablet Take 1-2 tablets by mouth every 4 (four) hours as needed for severe pain. (Patient not taking: Reported on 10/02/2014) 20 tablet 0  . [DISCONTINUED] XARELTO STARTER PACK 15 & 20 MG TBPK Take 15-20 mg by mouth as directed. Take as directed on package: Start with one  15mg  tablet by mouth twice a day with food. On Day 22, switch to one 20mg  tablet once a day with food. 51 each 0   No current facility-administered medications for this visit.    REVIEW OF SYSTEMS:   Constitutional: Denies fevers, chills or abnormal night sweats Eyes: Denies blurriness of vision, double vision or watery eyes Ears, nose, mouth, throat, and face: Denies mucositis or sore throat Respiratory: Denies cough, dyspnea or wheezes Cardiovascular: Denies palpitation, chest discomfort or lower extremity swelling Gastrointestinal:  Denies nausea, heartburn or change in bowel habits Skin: Denies abnormal skin rashes Lymphatics: Denies new lymphadenopathy or easy  bruising Neurological:Denies numbness, tingling or new weaknesses Behavioral/Psych: Mood is stable, no new changes  Ext: (+) Pain and swelling in left arm and left leg  All other systems were reviewed with the patient and are negative.  PHYSICAL EXAMINATION: ECOG PERFORMANCE STATUS: 0 - Asymptomatic  Filed Vitals:   10/02/14 0944  BP: 103/70  Pulse: 85  Temp: 98.6 F (37 C)  Resp: 18   Filed Weights   10/02/14 0944  Weight: 168 lb 9.6 oz (76.476 kg)    GENERAL:alert, no distress and comfortable SKIN: skin color, texture, turgor are normal, no rashes or significant lesions EYES: normal, conjunctiva are pink and non-injected, sclera clear OROPHARYNX:no exudate, no erythema and lips, buccal mucosa, and tongue normal  NECK: supple, thyroid normal size, non-tender, without nodularity LYMPH:  no palpable lymphadenopathy in the cervical, axillary or inguinal LUNGS: clear to auscultation and percussion with normal breathing effort HEART: regular rate & rhythm and no murmurs and no lower extremity edema ABDOMEN:abdomen soft, non-tender and normal bowel sounds Musculoskeletal:no cyanosis of digits and no clubbing, mild nonpitting edema on left upper extremity and left lower extremity. Tenderness of the left arm and left calf. PSYCH: alert & oriented x 3 with fluent speech NEURO: no focal motor/sensory deficits  LABORATORY DATA:  CBC Latest Ref Rng 09/27/2014 09/14/2014 09/10/2014  WBC 4.0 - 10.5 K/uL 4.7 4.7 4.4  Hemoglobin 12.0 - 15.0 g/dL 13.0 12.7 13.2  Hematocrit 36.0 - 46.0 % 40.4 39.2 40.2  Platelets 150 - 400 K/uL 312 268 284    CMP Latest Ref Rng 09/27/2014 09/14/2014 09/10/2014  Glucose 70 - 99 mg/dL 89 131(H) 141(H)  BUN 6 - 23 mg/dL 9 7 10   Creatinine 0.50 - 1.10 mg/dL 0.81 0.87 0.88  Sodium 135 - 145 mmol/L 137 138 139  Potassium 3.5 - 5.1 mmol/L 3.7 3.6 3.8  Chloride 96 - 112 mmol/L 105 108 110  CO2 19 - 32 mmol/L 25 23 22   Calcium 8.4 - 10.5 mg/dL 9.0 9.1 9.1  Total  Protein 6.0 - 8.3 g/dL 7.3 - -  Total Bilirubin 0.3 - 1.2 mg/dL 0.6 - -  Alkaline Phos 39 - 117 U/L 56 - -  AST 0 - 37 U/L 19 - -  ALT 0 - 35 U/L 14 - -     RADIOGRAPHIC STUDIES: I have personally reviewed the radiological images as listed and agreed with the findings in the report. Dg Chest 2 View  09/10/2014   CLINICAL DATA:  34 year old female with pain beneath the left breast since last night. Chest pain. Initial encounter.  EXAM: CHEST  2 VIEW  COMPARISON:  12/09/2009 and earlier.  FINDINGS: Stable scoliosis. Normal cardiac size and mediastinal contours. Visualized tracheal air column is within normal limits. Low normal lung volumes. The lungs remain clear. No pneumothorax or effusion. Stable cholecystectomy clips.  IMPRESSION: No acute cardiopulmonary  abnormality.   Electronically Signed   By: Genevie Ann M.D.   On: 09/10/2014 13:52   Ct Angio Chest Pe W/cm &/or Wo Cm  09/27/2014   CLINICAL DATA:  Intermittent mid chest pain that radiates to the right shoulder and right arm. Symptoms began 2 weeks ago. Shortness of breath and nausea. Diagnosis of pulmonary emboli 2 weeks ago.  EXAM: CT ANGIOGRAPHY CHEST WITH CONTRAST  TECHNIQUE: Multidetector CT imaging of the chest was performed using the standard protocol during bolus administration of intravenous contrast. Multiplanar CT image reconstructions and MIPs were obtained to evaluate the vascular anatomy.  CONTRAST:  146mL OMNIPAQUE IOHEXOL 350 MG/ML SOLN  COMPARISON:  Radiography 09/14/2014.  CT angiography 09/10/2014.  FINDINGS: The aorta is normal.  No coronary artery calcification.  The pulmonary arterial tree appears normal today. No filling defects are seen. Small or resolving emboli on the previous study are completely lysed and there is no evidence of any new embolic disease.  No pleural or pericardial fluid. No hilar or mediastinal lymphadenopathy.  The lungs are clear except for mild dependent atelectasis at both lung bases.  Scans in the upper  abdomen are unremarkable.  No significant spinal or chest wall finding.  Review of the MIP images confirms the above findings.  IMPRESSION: Tiny filling defects in the right lower lobe pulmonary arterial tree on the previous study are no longer visible. These have presumably completely lysed. There are no new pulmonary emboli.  Minimal dependent pulmonary atelectasis at the bases, probably not significant.   Electronically Signed   By: Nelson Chimes M.D.   On: 09/27/2014 15:05   Ct Angio Chest Pe W/cm &/or Wo Cm  09/10/2014   CLINICAL DATA:  Left-sided chest pain and shortness of breath for 1 day  EXAM: CT ANGIOGRAPHY CHEST WITH CONTRAST  TECHNIQUE: Multidetector CT imaging of the chest was performed using the standard protocol during bolus administration of intravenous contrast. Multiplanar CT image reconstructions and MIPs were obtained to evaluate the vascular anatomy.  CONTRAST:  153mL OMNIPAQUE IOHEXOL 350 MG/ML SOLN  COMPARISON:  Plain film from earlier in the same day  FINDINGS: The lungs are well aerated bilaterally. Mild dependent atelectatic changes are seen. No focal parenchymal nodule, effusion or pneumothorax is noted.  The thoracic inlet is within normal limits. The thoracic aorta its branches are unremarkable. No aneurysmal dilatation or dissection is identified. The pulmonary artery is well visualized and demonstrates a normal branching pattern. A few small adherent filling defects are identified within the lower lobe pulmonary arterial branches on the right. These likely represent chronic recanalized pulmonary emboli. No large acute pulmonary embolus is identified. No hilar or mediastinal adenopathy is noted.  The upper abdomen shows no acute abnormality. The bony structures are within normal limits.  Review of the MIP images confirms the above findings.  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 No acute pulmonary embolus is noted.   No acute abnormality is noted.   Electronically Signed   By: Inez Catalina M.D.   On: 09/10/2014 14:45   Dg Chest Port 1 View  09/14/2014   CLINICAL DATA:  Short of breath  EXAM: PORTABLE CHEST - 1 VIEW  COMPARISON:  09/10/2014  FINDINGS: The heart size and mediastinal contours are within normal limits. Both lungs are clear. The visualized skeletal structures are unremarkable.  IMPRESSION: No active disease.   Electronically Signed   By: Kerby Moors M.D.   On: 09/14/2014  14:15    ASSESSMENT:  34 year old female, without significant past medical history, presented was unprovoked PE. She does not smoke, not on oral contraceptives, no family history of thrombosis  PLAN:  I reviewed with the patient about the plan for care for PE.  This episode appears to be unprovoked. We discussed about the pros and cons about testing for thrombophilia disorder. her current anticoagulation therapy will interfere with some the tests and it is not possible to interpret the test results.  Taking her off the anticoagulation therapy to do the tests may precipitate another thrombotic event. She was tested negative for factor V Leyden and antithrombin III, I'll check her for semi-gene mutation.   My recommendation for her anticoagulation is at least 6 months. After she comes off anticoagulation, I'll do the rest of hypercoagulation workup, to see if she has any genetic susceptibility for thrombosis.  We discussed about various options of anticoagulation therapies including warfarin, low molecular weight heparin such as Lovenox or newer agents such as Rivaroxaban. Some of the risks and benefits discussed including costs involved, the need for monitoring, risks of life-threatening bleeding/hospitalization, reversibility of each agent in the event of bleeding or overdose, safety profile of each drug and taking into account other social issues such as ease of administration of medications, etc.  She did have recurrent  episodes of chest pain, and left arm and lack pain and edema, although imaging study were negative for DVT and PE, allergic concerned she may have small recurrent DVT when she will was on Coumadin. I suggest her to switch to Lovenox for now.  Another main issue we discussed today included the role of screening other family members for thrombophilia disorder. At present time, I would not recommend testing the patient's family members as it would not benefit them.  Thrombophilia disorder is a genetic predisposition which increases an individual's risk for a thrombotic event, NOT a disease.  We discussed the implications of genetic screening including the possibility of uninsurability, costs involved, emotional distress and possible discrimination at various levels for the affected individual.  Rather than genetic screening, one can possibly benefit from genetic counseling or dissemination of appropriate reading materials to educate other family members.  Finally, at the end of our consultation today, I reinforced the importance of preventive strategies such as avoiding hormonal supplement, avoiding cigarette smoking, keeping up-to-date with screening programs for early cancer detection, frequent ambulation for long distance travel and aggressive DVT prophylaxis in all surgical settings.  Plan -Repeat Doppler of left arm and left leg to ruled out DVT today ( it came back negative) -Switch from Coumadin to Lovenox. I sent a prescription to her pharmacy -Return to clinic in 1 months for follow-up.  All questions were answered. The patient knows to call the clinic with any problems, questions or concerns. I spent 40 minutes counseling the patient face to face. The total time spent in the appointment was 55 minutes and more than 50% was on counseling.     Truitt Merle, MD 10/02/2014 10:10 AM

## 2014-10-02 NOTE — Telephone Encounter (Signed)
gv and printed appt sched and avs for pt for April  °

## 2014-10-02 NOTE — Progress Notes (Signed)
Left Lower Extremity Venous Duplex Completed. °Brianna L Mazza,RVT °

## 2014-10-02 NOTE — Progress Notes (Signed)
Checked in new pt with no financial concerns at this time.  Pt's PCP is Dr. Carol Ada but [facility] is on her Medicaid card but she says she's never been there so I advised her to contact her case worker and get that changed to Dr. Carol Ada.  I entered Dr. Thompson Caul NPI number on the encounters for today.

## 2014-10-10 ENCOUNTER — Emergency Department (HOSPITAL_BASED_OUTPATIENT_CLINIC_OR_DEPARTMENT_OTHER): Payer: 59

## 2014-10-10 ENCOUNTER — Emergency Department (HOSPITAL_BASED_OUTPATIENT_CLINIC_OR_DEPARTMENT_OTHER)
Admission: EM | Admit: 2014-10-10 | Discharge: 2014-10-10 | Disposition: A | Discharge status: Home / Self Care | Payer: 59 | Attending: Emergency Medicine | Admitting: Emergency Medicine

## 2014-10-10 ENCOUNTER — Encounter (HOSPITAL_BASED_OUTPATIENT_CLINIC_OR_DEPARTMENT_OTHER): Payer: Self-pay | Admitting: *Deleted

## 2014-10-10 DIAGNOSIS — Z7901 Long term (current) use of anticoagulants: Secondary | ICD-10-CM | POA: Diagnosis not present

## 2014-10-10 DIAGNOSIS — Z90710 Acquired absence of both cervix and uterus: Secondary | ICD-10-CM | POA: Diagnosis not present

## 2014-10-10 DIAGNOSIS — Z9889 Other specified postprocedural states: Secondary | ICD-10-CM | POA: Insufficient documentation

## 2014-10-10 DIAGNOSIS — Z9049 Acquired absence of other specified parts of digestive tract: Secondary | ICD-10-CM | POA: Insufficient documentation

## 2014-10-10 DIAGNOSIS — Z86711 Personal history of pulmonary embolism: Secondary | ICD-10-CM | POA: Diagnosis not present

## 2014-10-10 DIAGNOSIS — Z8719 Personal history of other diseases of the digestive system: Secondary | ICD-10-CM | POA: Insufficient documentation

## 2014-10-10 DIAGNOSIS — R1012 Left upper quadrant pain: Secondary | ICD-10-CM | POA: Diagnosis not present

## 2014-10-10 DIAGNOSIS — Z862 Personal history of diseases of the blood and blood-forming organs and certain disorders involving the immune mechanism: Secondary | ICD-10-CM | POA: Diagnosis not present

## 2014-10-10 DIAGNOSIS — Z3202 Encounter for pregnancy test, result negative: Secondary | ICD-10-CM | POA: Insufficient documentation

## 2014-10-10 DIAGNOSIS — Z87891 Personal history of nicotine dependence: Secondary | ICD-10-CM | POA: Insufficient documentation

## 2014-10-10 LAB — URINALYSIS, ROUTINE W REFLEX MICROSCOPIC
BILIRUBIN URINE: NEGATIVE
GLUCOSE, UA: NEGATIVE mg/dL
Hgb urine dipstick: NEGATIVE
KETONES UR: NEGATIVE mg/dL
LEUKOCYTES UA: NEGATIVE
NITRITE: NEGATIVE
Protein, ur: NEGATIVE mg/dL
Specific Gravity, Urine: 1.005 (ref 1.005–1.030)
Urobilinogen, UA: 0.2 mg/dL (ref 0.0–1.0)
pH: 7 (ref 5.0–8.0)

## 2014-10-10 LAB — COMPREHENSIVE METABOLIC PANEL
ALT: 15 U/L (ref 0–35)
ANION GAP: 7 (ref 5–15)
AST: 19 U/L (ref 0–37)
Albumin: 4.2 g/dL (ref 3.5–5.2)
Alkaline Phosphatase: 60 U/L (ref 39–117)
BUN: 9 mg/dL (ref 6–23)
CO2: 25 mmol/L (ref 19–32)
Calcium: 9.2 mg/dL (ref 8.4–10.5)
Chloride: 108 mmol/L (ref 96–112)
Creatinine, Ser: 0.99 mg/dL (ref 0.50–1.10)
GFR calc Af Amer: 86 mL/min — ABNORMAL LOW (ref >90)
GFR calc non Af Amer: 75 mL/min — ABNORMAL LOW (ref >90)
Glucose, Bld: 88 mg/dL (ref 70–99)
POTASSIUM: 3.9 mmol/L (ref 3.5–5.1)
Sodium: 140 mmol/L (ref 135–145)
TOTAL PROTEIN: 7.2 g/dL (ref 6.0–8.3)
Total Bilirubin: 0.4 mg/dL (ref 0.3–1.2)

## 2014-10-10 LAB — APTT: aPTT: 32 seconds (ref 24–37)

## 2014-10-10 LAB — CBC WITH DIFFERENTIAL/PLATELET
BASOS ABS: 0 10*3/uL (ref 0.0–0.1)
Basophils Relative: 0 % (ref 0–1)
EOS ABS: 0.1 10*3/uL (ref 0.0–0.7)
Eosinophils Relative: 2 % (ref 0–5)
HEMATOCRIT: 39 % (ref 36.0–46.0)
Hemoglobin: 13.1 g/dL (ref 12.0–15.0)
Lymphocytes Relative: 28 % (ref 12–46)
Lymphs Abs: 1.5 10*3/uL (ref 0.7–4.0)
MCH: 29.9 pg (ref 26.0–34.0)
MCHC: 33.6 g/dL (ref 30.0–36.0)
MCV: 89 fL (ref 78.0–100.0)
Monocytes Absolute: 0.7 10*3/uL (ref 0.1–1.0)
Monocytes Relative: 14 % — ABNORMAL HIGH (ref 3–12)
NEUTROS PCT: 56 % (ref 43–77)
Neutro Abs: 3 10*3/uL (ref 1.7–7.7)
PLATELETS: 287 10*3/uL (ref 150–400)
RBC: 4.38 MIL/uL (ref 3.87–5.11)
RDW: 13.7 % (ref 11.5–15.5)
WBC: 5.2 10*3/uL (ref 4.0–10.5)

## 2014-10-10 LAB — PROTIME-INR
INR: 0.98 (ref 0.00–1.49)
Prothrombin Time: 13 seconds (ref 11.6–15.2)

## 2014-10-10 LAB — LIPASE, BLOOD: Lipase: 40 U/L (ref 11–59)

## 2014-10-10 LAB — PREGNANCY, URINE: Preg Test, Ur: NEGATIVE

## 2014-10-10 MED ORDER — MORPHINE SULFATE 4 MG/ML IJ SOLN
4.0000 mg | Freq: Once | INTRAMUSCULAR | Status: AC
  Administered 2014-10-10: 4 mg via INTRAVENOUS
  Filled 2014-10-10: qty 1

## 2014-10-10 NOTE — ED Provider Notes (Signed)
CSN: [id number]     Arrival date & time 10/10/14  1420 History   First MD Initiated Contact with Patient 10/10/14 1708     Chief Complaint  Patient presents with  . Abdominal Pain     (Consider location/radiation/quality/duration/timing/severity/associated sxs/prior Treatment) HPI Comments: Patient with a history of PE on Lovenox shots presents with left upper abdominal pain. She states that she's had a one-day history of some pain in her left upper abdomen and lower chest. It's worse with coughing and worse with movement. She denies any shortness of breath. She had a similar pain in December 2015 and at that time she had a CT scan of her abdomen which showed a mass in her spleen. 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 She denies any nausea or vomiting. She denies any fevers or chills. She's having normal bowel movements. She denies any urinary symptoms. She denies any back pain. She states she's taking her Lovenox shots regularly. She was previously on Coumadin but her INR was fluctuating and her hematologist told her just to switch to Lovenox shots. She has not missed any doses. She's had some mild swelling of her lower extremities, her left more than right. She had a recent Doppler ultrasound of her left leg which was negative for DVT. This was done last week.  Patient is a 34 y.o. female presenting with abdominal pain.  Abdominal Pain Associated symptoms: no chest pain, no chills, no cough, no diarrhea, no fatigue, no fever, no hematuria, no nausea, no shortness of breath and no vomiting     Past Medical History  Diagnosis Date  . Ulcer   . Encounter for diagnostic endoscopy   . Uterine fibroids affecting pregnancy   . Gall bladder stones   . Anemia   . Pulmonary embolus    Past Surgical History  Procedure Laterality Date  . Cesarean section    . Myomectomy    . Laporoscopy    . Ovarian cysts    . Wisdom tooth extraction    . Cholecystectomy    . Abdominal  hysterectomy     Family History  Problem Relation Age of Onset  . Arthritis Mother   . Hypertension Mother   . Heart disease Father   . Cancer Maternal Aunt     leukemia   . Cancer Maternal Grandfather 60    lung cancer   . Cancer Maternal Aunt     breast ca  . Cancer Maternal Aunt     lung ca   History  Substance Use Topics  . Smoking status: Former Smoker -- 0.25 packs/day for .5 years    Types: Cigarettes    Quit date: 10/09/2001  . Smokeless tobacco: Never Used  . Alcohol Use: No   OB History    Gravida Para Term Preterm AB TAB SAB Ectopic Multiple Living   3 2 1 1      2      Review of Systems  Constitutional: Negative for fever, chills, diaphoresis and fatigue.  HENT: Negative for congestion, rhinorrhea and sneezing.   Eyes: Negative.   Respiratory: Negative for cough, chest tightness and shortness of breath.   Cardiovascular: Negative for chest pain and leg swelling.  Gastrointestinal: Positive for abdominal pain. Negative for nausea, vomiting, diarrhea and blood in stool.  Genitourinary: Negative for frequency, hematuria, flank pain and difficulty urinating.  Musculoskeletal: Negative for back pain and arthralgias.  Skin: Negative for rash.  Neurological: Negative for dizziness, speech difficulty, weakness,  numbness and headaches.      Allergies  Review of patient's allergies indicates no known allergies.  Home Medications   Prior to Admission medications   Medication Sig Start Date End Date Taking? Authorizing Provider  enoxaparin (LOVENOX) 80 MG/0.8ML injection Inject 0.8 mLs (80 mg total) into the skin every 12 (twelve) hours. 10/02/14  Yes Truitt Merle, MD  oxyCODONE-acetaminophen (PERCOCET/ROXICET) 5-325 MG per tablet Take 1-2 tablets by mouth every 4 (four) hours as needed for severe pain. 09/14/14  Yes Lacretia Leigh, MD  warfarin (COUMADIN) 5 MG tablet Take 2 tablets (10 mg total) by mouth daily. 10mg  once daily x 3 days then per your doctor Patient taking  differently: Take 7.5 mg by mouth daily. Take Coumadin  7.5mg   On  M - W - F ;  5mg   All other days. 09/10/14   Debby Freiberg, MD   BP 106/68 mmHg  Pulse 86  Temp(Src) 98.4 F (36.9 C) (Oral)  Resp 18  Ht 5\' 3"  (1.6 m)  Wt 163 lb (73.936 kg)  BMI 28.88 kg/m2  SpO2 99% Physical Exam  Constitutional: She is oriented to person, place, and time. She appears well-developed and well-nourished.  HENT:  Head: Normocephalic and atraumatic.  Eyes: Pupils are equal, round, and reactive to light.  Neck: Normal range of motion. Neck supple.  Cardiovascular: Normal rate, regular rhythm and normal heart sounds.   Pulmonary/Chest: Effort normal and breath sounds normal. No respiratory distress. She has no wheezes. She has no rales. She exhibits tenderness (Positive tenderness to the left lower chest wall. No crepitus or deformity).  Abdominal: Soft. Bowel sounds are normal. There is no tenderness. There is no rebound and no guarding.  Musculoskeletal: Normal range of motion. She exhibits no edema.  No calf tenderness  Lymphadenopathy:    She has no cervical adenopathy.  Neurological: She is alert and oriented to person, place, and time.  Skin: Skin is warm and dry. No rash noted.  Psychiatric: She has a normal mood and affect.    ED Course  Procedures (including critical care time) Labs Review Labs Reviewed  CBC WITH DIFFERENTIAL/PLATELET - Abnormal; Notable for the following:    Monocytes Relative 14 (*)    All other components within normal limits  COMPREHENSIVE METABOLIC PANEL - Abnormal; Notable for the following:    GFR calc non Af Amer 75 (*)    GFR calc Af Amer 86 (*)    All other components within normal limits  URINALYSIS, ROUTINE W REFLEX MICROSCOPIC  PREGNANCY, URINE  LIPASE, BLOOD  PROTIME-INR  APTT    Imaging Review Dg Abd Acute W/chest  10/10/2014   CLINICAL DATA:  Left-sided pain  EXAM: ACUTE ABDOMEN SERIES (ABDOMEN 2 VIEW & CHEST 1 VIEW)  COMPARISON:  None.  FINDINGS:  Cardiac shadow is within normal limits. The lungs are clear bilaterally. A mild scoliosis of the thoracolumbar spine is seen.  Changes of prior cholecystectomy are noted. Mild fecal material is seen within the colon. No free air is noted. No obstructive changes are seen. No abnormal mass or abnormal calcifications are seen. Degenerative changes of the pubic symphysis are noted.  IMPRESSION: No acute abnormality seen.   Electronically Signed   By: Inez Catalina M.D.   On: 10/10/2014 17:52     EKG Interpretation None      MDM   Final diagnoses:  Left upper quadrant pain    Patient's labs are unremarkable. She has normal oxygen saturations of 99-100%. Her pain  is in her lower chest wall/upper abdomen. She does not report any shortness of breath. It is worse with coughing but there is no abnormality seen on chest x-ray. She's had 2 recent CT scans of her chest in a recent CT scan of her abdomen and pelvis in December. At this point I don't feel that we need to reimage her chest or abdomen. She's been taking her Lovenox regularly and she has no clinical indications of a new pulmonary embolus. She has no tachycardia or hypoxia. She was given 1 dose of pain medication ED and is feeling better. She was discharged home in good condition and will follow-up with her primary care physician for recheck either tomorrow or Monday. I advised her return here if she has any worsening symptoms. She is good with this plan. I explained to her the downside of frequent CT imaging in the risk of radiation exposure. I did stress to her she has any worsening symptoms she needs to return for reevaluation.    Malvin Johns, MD 10/10/14 2002

## 2014-10-10 NOTE — ED Notes (Signed)
Pt states that she has hx of Pulm Embolism and was previously on coumadin last Friday and now Lovenox injections.

## 2014-10-10 NOTE — Discharge Instructions (Signed)

## 2014-10-10 NOTE — ED Notes (Signed)
Denies N/VD.   States was dx with mass on spleen in 12/15.

## 2014-10-10 NOTE — ED Notes (Signed)
MD at bedside. 

## 2014-10-10 NOTE — ED Notes (Signed)
LUQ pain and swelling since last night - denies n/v- denies injury

## 2014-10-22 ENCOUNTER — Emergency Department (HOSPITAL_COMMUNITY): Payer: 59

## 2014-10-22 ENCOUNTER — Emergency Department (HOSPITAL_COMMUNITY)
Admission: EM | Admit: 2014-10-22 | Discharge: 2014-10-22 | Disposition: A | Discharge status: Home / Self Care | Payer: 59 | Attending: Emergency Medicine | Admitting: Emergency Medicine

## 2014-10-22 ENCOUNTER — Encounter (HOSPITAL_COMMUNITY): Payer: Self-pay | Admitting: *Deleted

## 2014-10-22 DIAGNOSIS — R079 Chest pain, unspecified: Secondary | ICD-10-CM

## 2014-10-22 DIAGNOSIS — Z87891 Personal history of nicotine dependence: Secondary | ICD-10-CM | POA: Insufficient documentation

## 2014-10-22 DIAGNOSIS — Z7901 Long term (current) use of anticoagulants: Secondary | ICD-10-CM | POA: Diagnosis not present

## 2014-10-22 DIAGNOSIS — R109 Unspecified abdominal pain: Secondary | ICD-10-CM | POA: Diagnosis not present

## 2014-10-22 DIAGNOSIS — Z3202 Encounter for pregnancy test, result negative: Secondary | ICD-10-CM | POA: Insufficient documentation

## 2014-10-22 DIAGNOSIS — R05 Cough: Secondary | ICD-10-CM | POA: Diagnosis not present

## 2014-10-22 DIAGNOSIS — R0602 Shortness of breath: Secondary | ICD-10-CM | POA: Insufficient documentation

## 2014-10-22 DIAGNOSIS — Z862 Personal history of diseases of the blood and blood-forming organs and certain disorders involving the immune mechanism: Secondary | ICD-10-CM | POA: Insufficient documentation

## 2014-10-22 DIAGNOSIS — Z86711 Personal history of pulmonary embolism: Secondary | ICD-10-CM | POA: Diagnosis not present

## 2014-10-22 DIAGNOSIS — Z8719 Personal history of other diseases of the digestive system: Secondary | ICD-10-CM | POA: Diagnosis not present

## 2014-10-22 DIAGNOSIS — R42 Dizziness and giddiness: Secondary | ICD-10-CM | POA: Diagnosis not present

## 2014-10-22 DIAGNOSIS — Z872 Personal history of diseases of the skin and subcutaneous tissue: Secondary | ICD-10-CM | POA: Diagnosis not present

## 2014-10-22 LAB — BASIC METABOLIC PANEL
ANION GAP: 9 (ref 5–15)
BUN: 8 mg/dL (ref 6–23)
CHLORIDE: 107 mmol/L (ref 96–112)
CO2: 22 mmol/L (ref 19–32)
Calcium: 9.3 mg/dL (ref 8.4–10.5)
Creatinine, Ser: 0.96 mg/dL (ref 0.50–1.10)
GFR calc Af Amer: 90 mL/min — ABNORMAL LOW (ref >90)
GFR, EST NON AFRICAN AMERICAN: 77 mL/min — AB (ref >90)
Glucose, Bld: 93 mg/dL (ref 70–99)
POTASSIUM: 4.2 mmol/L (ref 3.5–5.1)
Sodium: 138 mmol/L (ref 135–145)

## 2014-10-22 LAB — CBC
HCT: 41.6 % (ref 36.0–46.0)
Hemoglobin: 13.3 g/dL (ref 12.0–15.0)
MCH: 29 pg (ref 26.0–34.0)
MCHC: 32 g/dL (ref 30.0–36.0)
MCV: 90.8 fL (ref 78.0–100.0)
PLATELETS: 306 10*3/uL (ref 150–400)
RBC: 4.58 MIL/uL (ref 3.87–5.11)
RDW: 13.8 % (ref 11.5–15.5)
WBC: 3.8 10*3/uL — AB (ref 4.0–10.5)

## 2014-10-22 LAB — I-STAT TROPONIN, ED: Troponin i, poc: 0 ng/mL (ref 0.00–0.08)

## 2014-10-22 LAB — POC URINE PREG, ED
Preg Test, Ur: NEGATIVE
Preg Test, Ur: NEGATIVE

## 2014-10-22 MED ORDER — IBUPROFEN 800 MG PO TABS
800.0000 mg | ORAL_TABLET | Freq: Once | ORAL | Status: AC
  Administered 2014-10-22: 800 mg via ORAL
  Filled 2014-10-22: qty 1

## 2014-10-22 NOTE — Discharge Instructions (Signed)

## 2014-10-22 NOTE — ED Provider Notes (Signed)
CSN: [id number]     Arrival date & time 10/22/14  1007 History   First MD Initiated Contact with Patient 10/22/14 1129     Chief Complaint  Patient presents with  . Chest Pain   Patient is a 34 y.o. female presenting with chest pain. The history is provided by the patient.  Chest Pain Pain location:  L chest Pain quality: sharp   Radiates to: left scapula. Pain severity:  Moderate Onset quality:  Sudden Duration:  12 hours Timing:  Constant Progression:  Unchanged Chronicity:  New Relieved by:  Nothing Worsened by:  Deep breathing, movement and certain positions (palpation) Associated symptoms: abdominal pain, cough, dizziness and shortness of breath   Associated symptoms: no diaphoresis, no lower extremity edema, no syncope and not vomiting   Pt reports onset of left CP for over 12 hrs.  It radiates into left scapula.   She reports it is constant She reports dyspnea on exertion as well She reports this feels similar to prior PE She is taking lovenox BID and has not missed doses    Past Medical History  Diagnosis Date  . Ulcer   . Encounter for diagnostic endoscopy   . Uterine fibroids affecting pregnancy   . Gall bladder stones   . Anemia   . Pulmonary embolus    Past Surgical History  Procedure Laterality Date  . Cesarean section    . Myomectomy    . Laporoscopy    . Ovarian cysts    . Wisdom tooth extraction    . Cholecystectomy    . Abdominal hysterectomy     Family History  Problem Relation Age of Onset  . Arthritis Mother   . Hypertension Mother   . Heart disease Father   . Cancer Maternal Aunt     leukemia   . Cancer Maternal Grandfather 60    lung cancer   . Cancer Maternal Aunt     breast ca  . Cancer Maternal Aunt     lung ca   History  Substance Use Topics  . Smoking status: Former Smoker -- 0.25 packs/day for .5 years    Types: Cigarettes    Quit date: 10/09/2001  . Smokeless tobacco: Never Used  . Alcohol Use: No   OB History     Gravida Para Term Preterm AB TAB SAB Ectopic Multiple Living   3 2 1 1      2      Review of Systems  Constitutional: Negative for diaphoresis.  Respiratory: Positive for cough and shortness of breath.   Cardiovascular: Positive for chest pain. Negative for syncope.  Gastrointestinal: Positive for abdominal pain. Negative for vomiting.  Genitourinary: Negative for vaginal bleeding.  Neurological: Positive for dizziness. Negative for syncope.  All other systems reviewed and are negative.     Allergies  Review of patient's allergies indicates no known allergies.  Home Medications   Prior to Admission medications   Medication Sig Start Date End Date Taking? Authorizing Provider  enoxaparin (LOVENOX) 80 MG/0.8ML injection Inject 0.8 mLs (80 mg total) into the skin every 12 (twelve) hours. 10/02/14  Yes Truitt Merle, MD  oxyCODONE-acetaminophen (PERCOCET/ROXICET) 5-325 MG per tablet Take 1-2 tablets by mouth every 4 (four) hours as needed for severe pain. 09/14/14   Lacretia Leigh, MD  warfarin (COUMADIN) 5 MG tablet Take 2 tablets (10 mg total) by mouth daily. 10mg  once daily x 3 days then per your doctor Patient not taking: Reported on 10/22/2014 09/10/14   Rodman Key  Colin Rhein, MD   BP 100/67 mmHg  Pulse 70  Temp(Src) 98 F (36.7 C) (Oral)  Resp 17  SpO2 98% Physical Exam CONSTITUTIONAL: Well developed/well nourished HEAD: Normocephalic/atraumatic EYES: EOMI/PERRL ENMT: Mucous membranes moist NECK: supple no meningeal signs SPINE/BACK:entire spine nontender CV: S1/S2 noted, no murmurs/rubs/gallops noted LUNGS: Lungs are clear to auscultation bilaterally, no apparent distress Chest - tenderness to left chest, no bruising noted ABDOMEN: soft, nontender, no rebound or guarding, bowel sounds noted throughout abdomen GU:no cva tenderness NEURO: Pt is awake/alert/appropriate, moves all extremitiesx4.  No facial droop.   EXTREMITIES: pulses normal/equal, full ROM, no LE edema/tenderness SKIN:  warm, color normal PSYCH: no abnormalities of mood noted, alert and oriented to situation  ED Course  Procedures   1:22 PM Pt with constant CP for over 12 hrs, mostly pleuritic but some pain with movement/palpation I doubt ACS/Dissection She is compliant with lovenox (no longer taking coumadin per PCP) She just had negative CT chest - i don't feel repeat needed as no signs of instability at this time.  No hypoxia and no tachycardia Advised PCP followup We discussed strict return precautions  Labs Review Labs Reviewed  CBC - Abnormal; Notable for the following:    WBC 3.8 (*)    All other components within normal limits  BASIC METABOLIC PANEL - Abnormal; Notable for the following:    GFR calc non Af Amer 77 (*)    GFR calc Af Amer 90 (*)    All other components within normal limits  I-STAT TROPOININ, ED  POC URINE PREG, ED  POC URINE PREG, ED    Imaging Review Dg Chest 2 View (if Patient Has Fever And/or Copd)  10/22/2014   CLINICAL DATA:  Cough and chest pain for 1 day  EXAM: CHEST  2 VIEW  COMPARISON:  October 10, 2014  FINDINGS: Lungs are clear. Heart size and pulmonary vascularity are normal. No adenopathy. There is mid thoracic dextroscoliosis with thoracolumbar levoscoliosis. No pneumothorax.  IMPRESSION: No edema or consolidation.  Scoliosis.   Electronically Signed   By: Lowella Grip III M.D.   On: 10/22/2014 10:39     EKG Interpretation   Date/Time:  Tuesday October 22 2014 10:17:35 EDT Ventricular Rate:  72 PR Interval:  158 QRS Duration: 68 QT Interval:  378 QTC Calculation: 414 R Axis:   86 Text Interpretation:  Sinus rhythm Low voltage, precordial leads Artifact  No significant change since last tracing Confirmed by Christy Gentles  MD, Elenore Rota  [phone]) on 10/22/2014 10:36:58 AM      MDM   Final diagnoses:  Chest pain, unspecified chest pain type    Nursing notes including past medical history and social history reviewed and considered in  documentation Labs/vital reviewed myself and considered during evaluation Previous records reviewed and considered xrays/imaging reviewed by myself and considered during evaluation     Ripley Fraise, MD 10/22/14 1323

## 2014-10-22 NOTE — ED Notes (Signed)
Pt reports hx of PE in Feb 2016, on lovenox. Pt has been seen and treated in ED twice this month for similar chest pain complaints. Reports chest pain/ left arm pain started last night. Denies n/v. Reports since last night SOB when walking. Able to speak in full sentences, 100% on room air. Reports intermittent dizziness. Denies dysuria.

## 2014-10-30 ENCOUNTER — Telehealth: Payer: Self-pay | Admitting: Hematology

## 2014-10-30 ENCOUNTER — Encounter: Payer: Self-pay | Admitting: Hematology

## 2014-10-30 ENCOUNTER — Other Ambulatory Visit (HOSPITAL_BASED_OUTPATIENT_CLINIC_OR_DEPARTMENT_OTHER): Payer: 59

## 2014-10-30 ENCOUNTER — Ambulatory Visit (HOSPITAL_BASED_OUTPATIENT_CLINIC_OR_DEPARTMENT_OTHER): Payer: 59 | Admitting: Hematology

## 2014-10-30 VITALS — BP 112/72 | HR 85 | Temp 98.2°F | Resp 18 | Ht 63.0 in | Wt 167.3 lb

## 2014-10-30 DIAGNOSIS — R0789 Other chest pain: Secondary | ICD-10-CM | POA: Diagnosis not present

## 2014-10-30 DIAGNOSIS — R1010 Upper abdominal pain, unspecified: Secondary | ICD-10-CM | POA: Diagnosis not present

## 2014-10-30 DIAGNOSIS — I2699 Other pulmonary embolism without acute cor pulmonale: Secondary | ICD-10-CM

## 2014-10-30 LAB — COMPREHENSIVE METABOLIC PANEL (CC13)
ALT: 49 U/L (ref 0–55)
ANION GAP: 10 meq/L (ref 3–11)
AST: 33 U/L (ref 5–34)
Albumin: 4.2 g/dL (ref 3.5–5.0)
Alkaline Phosphatase: 60 U/L (ref 40–150)
BILIRUBIN TOTAL: 0.36 mg/dL (ref 0.20–1.20)
BUN: 6.1 mg/dL — ABNORMAL LOW (ref 7.0–26.0)
CO2: 25 meq/L (ref 22–29)
CREATININE: 0.9 mg/dL (ref 0.6–1.1)
Calcium: 9.4 mg/dL (ref 8.4–10.4)
Chloride: 107 mEq/L (ref 98–109)
EGFR: >90 mL/min/{1.73_m2} (ref >90)
Glucose: 96 mg/dl (ref 70–140)
Potassium: 4.3 mEq/L (ref 3.5–5.1)
Sodium: 142 mEq/L (ref 136–145)
Total Protein: 7.1 g/dL (ref 6.4–8.3)

## 2014-10-30 LAB — CBC WITH DIFFERENTIAL/PLATELET
BASO%: 0.8 % (ref 0.0–2.0)
Basophils Absolute: 0 10*3/uL (ref 0.0–0.1)
EOS%: 4.1 % (ref 0.0–7.0)
Eosinophils Absolute: 0.2 10*3/uL (ref 0.0–0.5)
HCT: 41.3 % (ref 34.8–46.6)
HGB: 13.1 g/dL (ref 11.6–15.9)
LYMPH%: 48.9 % (ref 14.0–49.7)
MCH: 28.6 pg (ref 25.1–34.0)
MCHC: 31.7 g/dL (ref 31.5–36.0)
MCV: 90.2 fL (ref 79.5–101.0)
MONO#: 0.3 10*3/uL (ref 0.1–0.9)
MONO%: 7.8 % (ref 0.0–14.0)
NEUT#: 1.5 10*3/uL (ref 1.5–6.5)
NEUT%: 38.4 % (ref 38.4–76.8)
Platelets: 256 10*3/uL (ref 145–400)
RBC: 4.58 10*6/uL (ref 3.70–5.45)
RDW: 13.8 % (ref 11.2–14.5)
WBC: 3.9 10*3/uL (ref 3.9–10.3)
lymph#: 1.9 10*3/uL (ref 0.9–3.3)

## 2014-10-30 NOTE — Telephone Encounter (Signed)
gave and printed appt sched and avs for pt for sept.

## 2014-10-30 NOTE — Progress Notes (Signed)
[facility]  Telephone:([phone]) [phone] Fax:([phone]) [phone]  Clinic New Consult Note   Patient Care Team: Carol Ada, MD as PCP - General (Family Medicine) 10/30/2014  CHIEF COMPLAINTS/PURPOSE OF CONSULTATION:  Pulmonary embolism  HISTORY OF PRESENTING ILLNESS:  Leslie Owen 34 y.o. female is here because of recently diagnosed pulmonary embolism.  She was at her usual health status continue 09/10/2014 when she developed left chest pain, dyspnea and left arm pain. She went to ED and was found to have PE. Doppler of the low extremity was negative for DVT. She was started on lovenox 70mg  q12h for 7 days, and she was transitioned to coumadin. She has been following up with her primary care physician Dr. Thompson Caul office for Coumadin dose adjustment. Her INR was supratherapeutic last week, Coumadin dose was decreased and her INR was 1.7 yesterday.  She developed left-sided chest pain again on 09/27/2014. She went to ED again and a CT of the chest angiogram was negative for PE. The chest pain resolved spontaneously 2 days later. She however noticed pain in the left arm and left leg from yesterday, with mild edema in both extremities, and limited range of motion of the left shoulder. She denies any fever, no injury or fall.  She otherwise is doing well. She works full-time, quite active, takes care of 3 young children at home.  INTERIM HISTORY: Leslie Owen returns for follow-up. She had 2 more episodes of intermittent left low chest/upper abdomen pain, with radiation to back, which lasts for several hours to a few days, she was evaluated at emergency room on March 17 and March 29, and abdominal x-ray was negative. CT chest was not repeated. The pain spontaneously resolved on its own. She denies any prior injury or other symptoms. She otherwise been doing well, no other new complaints. She tolerates Lovenox injection well, no signs of bleeding.  MEDICAL HISTORY:  Past Medical History    Diagnosis Date  . Ulcer   . Encounter for diagnostic endoscopy   . Uterine fibroids affecting pregnancy   . Gall bladder stones   . Anemia   . Pulmonary embolus     SURGICAL HISTORY: Past Surgical History  Procedure Laterality Date  . Cesarean section    . Myomectomy    . Laporoscopy    . Ovarian cysts    . Wisdom tooth extraction    . Cholecystectomy    . Abdominal hysterectomy      SOCIAL HISTORY: History   Social History  . Marital Status: Married    Spouse Name: N/A  . Number of Children: 3, age of 54, 3, 2   . Years of Education: N/A   Occupational History  . Not on file.   Social History Main Topics  . Smoking status: Former Smoker -- 0.25 packs/day for .5 years    Types: Cigarettes    Quit date: 10/09/2001  . Smokeless tobacco: Never Used  . Alcohol Use: No  . Drug Use: No  . Sexual Activity: Yes   Other Topics Concern  . Not on file   Social History Narrative    FAMILY HISTORY: Family History  Problem Relation Age of Onset  . Arthritis Mother   . Hypertension Mother   . Heart disease Father   . Cancer Maternal Aunt     leukemia   . Cancer Maternal Grandfather 60    lung cancer   . Cancer Maternal Aunt     breast ca  . Cancer Maternal Aunt  lung ca    ALLERGIES:  has No Known Allergies.  MEDICATIONS:  Current Outpatient Prescriptions  Medication Sig Dispense Refill  . enoxaparin (LOVENOX) 80 MG/0.8ML injection Inject 0.8 mLs (80 mg total) into the skin every 12 (twelve) hours. 60 Syringe 2  . oxyCODONE-acetaminophen (PERCOCET/ROXICET) 5-325 MG per tablet Take 1-2 tablets by mouth every 4 (four) hours as needed for severe pain. 20 tablet 0  . warfarin (COUMADIN) 5 MG tablet   0  . [DISCONTINUED] XARELTO STARTER PACK 15 & 20 MG TBPK Take 15-20 mg by mouth as directed. Take as directed on package: Start with one 15mg  tablet by mouth twice a day with food. On Day 22, switch to one 20mg  tablet once a day with food. 51 each 0   No current  facility-administered medications for this visit.    REVIEW OF SYSTEMS:   Constitutional: Denies fevers, chills or abnormal night sweats Eyes: Denies blurriness of vision, double vision or watery eyes Ears, nose, mouth, throat, and face: Denies mucositis or sore throat Respiratory: Denies cough, dyspnea or wheezes Cardiovascular: Denies palpitation, chest discomfort or lower extremity swelling Gastrointestinal:  Denies nausea, heartburn or change in bowel habits Skin: Denies abnormal skin rashes Lymphatics: Denies new lymphadenopathy or easy bruising Neurological:Denies numbness, tingling or new weaknesses Behavioral/Psych: Mood is stable, no new changes  Ext: (+) Pain and swelling in left arm and left leg  All other systems were reviewed with the patient and are negative.  PHYSICAL EXAMINATION: ECOG PERFORMANCE STATUS: 0 - Asymptomatic  Filed Vitals:   10/30/14 1000  BP: 112/72  Pulse: 85  Temp: 98.2 F (36.8 C)  Resp: 18   Filed Weights   10/30/14 1000  Weight: 167 lb 4.8 oz (75.887 kg)    GENERAL:alert, no distress and comfortable SKIN: skin color, texture, turgor are normal, no rashes or significant lesions EYES: normal, conjunctiva are pink and non-injected, sclera clear OROPHARYNX:no exudate, no erythema and lips, buccal mucosa, and tongue normal  NECK: supple, thyroid normal size, non-tender, without nodularity LYMPH:  no palpable lymphadenopathy in the cervical, axillary or inguinal LUNGS: clear to auscultation and percussion with normal breathing effort HEART: regular rate & rhythm and no murmurs and no lower extremity edema ABDOMEN:abdomen soft, non-tender and normal bowel sounds Musculoskeletal:no cyanosis of digits and no clubbing, mild nonpitting edema on left upper extremity and left lower extremity. Tenderness of the left arm and left calf. PSYCH: alert & oriented x 3 with fluent speech NEURO: no focal motor/sensory deficits  LABORATORY DATA:  CBC Latest  Ref Rng 10/30/2014 10/22/2014 10/10/2014  WBC 3.9 - 10.3 10e3/uL 3.9 3.8(L) 5.2  Hemoglobin 11.6 - 15.9 g/dL 13.1 13.3 13.1  Hematocrit 34.8 - 46.6 % 41.3 41.6 39.0  Platelets 145 - 400 10e3/uL 256 306 287    CMP Latest Ref Rng 10/30/2014 10/22/2014 10/10/2014  Glucose 70 - 140 mg/dl 96 93 88  BUN 7.0 - 26.0 mg/dL 6.1(L) 8 9  Creatinine 0.6 - 1.1 mg/dL 0.9 0.96 0.99  Sodium 136 - 145 mEq/L 142 138 140  Potassium 3.5 - 5.1 mEq/L 4.3 4.2 3.9  Chloride 96 - 112 mmol/L - 107 108  CO2 22 - 29 mEq/L 25 22 25   Calcium 8.4 - 10.4 mg/dL 9.4 9.3 9.2  Total Protein 6.4 - 8.3 g/dL 7.1 - 7.2  Total Bilirubin 0.20 - 1.20 mg/dL 0.36 - 0.4  Alkaline Phos 40 - 150 U/L 60 - 60  AST 5 - 34 U/L 33 - 19  ALT  0 - 55 U/L 49 - 15     RADIOGRAPHIC STUDIES: I have personally reviewed the radiological images as listed and agreed with the findings in the report. Dg Chest 2 View (if Patient Has Fever And/or Copd)  10/22/2014   CLINICAL DATA:  Cough and chest pain for 1 day  EXAM: CHEST  2 VIEW  COMPARISON:  October 10, 2014  FINDINGS: Lungs are clear. Heart size and pulmonary vascularity are normal. No adenopathy. There is mid thoracic dextroscoliosis with thoracolumbar levoscoliosis. No pneumothorax.  IMPRESSION: No edema or consolidation.  Scoliosis.   Electronically Signed   By: Lowella Grip III M.D.   On: 10/22/2014 10:39   Dg Abd Acute W/chest  10/10/2014   CLINICAL DATA:  Left-sided pain  EXAM: ACUTE ABDOMEN SERIES (ABDOMEN 2 VIEW & CHEST 1 VIEW)  COMPARISON:  None.  FINDINGS: Cardiac shadow is within normal limits. The lungs are clear bilaterally. A mild scoliosis of the thoracolumbar spine is seen.  Changes of prior cholecystectomy are noted. Mild fecal material is seen within the colon. No free air is noted. No obstructive changes are seen. No abnormal mass or abnormal calcifications are seen. Degenerative changes of the pubic symphysis are noted.  IMPRESSION: No acute abnormality seen.   Electronically Signed    By: Inez Catalina M.D.   On: 10/10/2014 17:52    ASSESSMENT:  34 year old female, without significant past medical history, presented was unprovoked PE. She does not smoke, not on oral contraceptives, no family history of thrombosis  PLAN:  Surgery-year-old African-American female with  1. Unprovoked right lower lobe pulmonary embolism -I recommend anticoagulation for total of 6 months. -Hypocoagulation lab work or months after she stops anticoagulation -I'll switch her back to Coumadin, she will follow-up with the Coumadin clinic in our cancer center. -She will restart Coumadin at 7.5 mg daily, the same dose she was on before, and continue Lovenox injection for 4 days. -She will return in 1 week to check her PT/INR  2. Recurrent left low chest/upper abdominal pain -This is unlikely related to her PE, which was in the right lower lobe. 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 We'll continue follow  Plan -Follow up with our Coumadin clinic -Stop Coumadin in 4 months -Hypercoagulation lab one months afterwards -Return to clinic in 5 months  All questions were answered. The patient knows to call the clinic with any problems, questions or concerns. I spent 20 minutes counseling the patient face to face. The total time spent in the appointment was 25 minutes and more than 50% was on counseling.     Truitt Merle, MD 10/30/2014 10:26 AM

## 2014-10-31 ENCOUNTER — Telehealth: Payer: Self-pay

## 2014-10-31 NOTE — Telephone Encounter (Signed)
Received anticoagulation clinic referral from Dr. Burr Medico. Called Leslie Owen at listed telephone number and left a voicemail for her to call our Coumadin Clinic [phone]) to schedule her appointment. Dr. Burr Medico was wanting [place] to see her next week.

## 2014-11-01 ENCOUNTER — Telehealth: Payer: Self-pay | Admitting: Pharmacist

## 2014-11-01 NOTE — Telephone Encounter (Signed)
I left vm for pt informing her we scheduled her for lab/Coumadin clinic 11/07/14 at 9:30 am for lab; 9:45 am for coumadin clinic. Kennith Center, Pharm.D., CPP 11/01/2014@10 :38 AM

## 2014-11-07 ENCOUNTER — Ambulatory Visit (HOSPITAL_BASED_OUTPATIENT_CLINIC_OR_DEPARTMENT_OTHER): Payer: 59 | Admitting: Pharmacist

## 2014-11-07 ENCOUNTER — Other Ambulatory Visit (HOSPITAL_BASED_OUTPATIENT_CLINIC_OR_DEPARTMENT_OTHER): Payer: 59

## 2014-11-07 DIAGNOSIS — I2699 Other pulmonary embolism without acute cor pulmonale: Secondary | ICD-10-CM

## 2014-11-07 LAB — CBC WITH DIFFERENTIAL/PLATELET
BASO%: 0.6 % (ref 0.0–2.0)
Basophils Absolute: 0 10*3/uL (ref 0.0–0.1)
EOS ABS: 0.1 10*3/uL (ref 0.0–0.5)
EOS%: 2.7 % (ref 0.0–7.0)
HEMATOCRIT: 39.8 % (ref 34.8–46.6)
HEMOGLOBIN: 12.9 g/dL (ref 11.6–15.9)
LYMPH%: 42.7 % (ref 14.0–49.7)
MCH: 28.8 pg (ref 25.1–34.0)
MCHC: 32.3 g/dL (ref 31.5–36.0)
MCV: 89.3 fL (ref 79.5–101.0)
MONO#: 0.4 10*3/uL (ref 0.1–0.9)
MONO%: 10.1 % (ref 0.0–14.0)
NEUT#: 1.8 10*3/uL (ref 1.5–6.5)
NEUT%: 43.9 % (ref 38.4–76.8)
PLATELETS: 257 10*3/uL (ref 145–400)
RBC: 4.46 10*6/uL (ref 3.70–5.45)
RDW: 14 % (ref 11.2–14.5)
WBC: 4 10*3/uL (ref 3.9–10.3)
lymph#: 1.7 10*3/uL (ref 0.9–3.3)

## 2014-11-07 LAB — COMPREHENSIVE METABOLIC PANEL (CC13)
ALK PHOS: 56 U/L (ref 40–150)
ALT: 47 U/L (ref 0–55)
AST: 41 U/L — AB (ref 5–34)
Albumin: 3.9 g/dL (ref 3.5–5.0)
Anion Gap: 7 mEq/L (ref 3–11)
BUN: 7.6 mg/dL (ref 7.0–26.0)
CO2: 23 mEq/L (ref 22–29)
Calcium: 8.9 mg/dL (ref 8.4–10.4)
Chloride: 110 mEq/L — ABNORMAL HIGH (ref 98–109)
Creatinine: 0.9 mg/dL (ref 0.6–1.1)
EGFR: >90 mL/min/{1.73_m2} (ref >90)
Glucose: 98 mg/dl (ref 70–140)
POTASSIUM: 4.1 meq/L (ref 3.5–5.1)
Sodium: 140 mEq/L (ref 136–145)
TOTAL PROTEIN: 6.8 g/dL (ref 6.4–8.3)
Total Bilirubin: 0.2 mg/dL (ref 0.20–1.20)

## 2014-11-07 LAB — POCT INR: INR: 2.4

## 2014-11-07 LAB — PROTIME-INR
INR: 2.4 (ref 2.00–3.50)
Protime: 28.8 Seconds — ABNORMAL HIGH (ref 10.6–13.4)

## 2014-11-07 NOTE — Progress Notes (Signed)
INR 2.4 today (Goal 2-3) Pt seen in coumadin clinic for first visit Pt is doing well with no complaints Pt previously on coumadin 2 months ago when new PE discovered. INR was difficult to manage by PCP so pt switched to lovenox Dr. Burr Medico switched pt back to coumadin with lovenox bridge last week Plan is 6 months total of anticoagulation - this will complete in early August 2016 Written and verbal instructions given to patient on warfarin and diet Pt voiced understanding No unusual bleeding or bruising except for around lovenox injection site No diet or medication changes No missed or extra doses  Plan: Stop lovenox.  Continue 7.5 mg daily. Coumadin therapy will be completed in August 2016 Recheck INR in 1 week on Friday 11/15/14 at 8:30am for lab and 8:45am for coumadin clinic

## 2014-11-07 NOTE — Patient Instructions (Signed)
INR at goal Stop lovenox.  Continue 7.5 mg daily.  Recheck INR in 1 week on Friday 11/15/14 at 8:30am for lab and 8:45am for coumadin clinic

## 2014-11-11 LAB — HYPERCOAGULABLE PANEL, COMPREHENSIVE
ANTICARDIOLIPIN IGG: 10 GPL U/mL (ref <23)
ANTITHROMB III FUNC: 95 % (ref 76–126)
Anticardiolipin IgA: 0 APL U/mL (ref <22)
Anticardiolipin IgM: 3 MPL U/mL (ref <11)
Beta-2 Glyco I IgG: 11 G Units (ref <20)
Beta-2-Glycoprotein I IgA: 6 A Units (ref <20)
Beta-2-Glycoprotein I IgM: 5 M Units (ref <20)
DRVVT: 30 secs (ref <42.9)
LUPUS ANTICOAGULANT: NOT DETECTED
PROTEIN S ACTIVITY: 42 % — AB (ref 69–129)
PTT Lupus Anticoagulant: 58.5 secs — ABNORMAL HIGH (ref 28.0–43.0)
PTTLA 4:1 Mix: 59.1 secs — ABNORMAL HIGH (ref 28.0–43.0)
PTTLA Confirmation: 0 secs (ref <8.0)
Protein C Activity: 16 % — ABNORMAL LOW (ref 75–133)
Protein C, Total: 70 % — ABNORMAL LOW (ref 72–160)
Protein S Total: 65 % (ref 60–150)

## 2014-11-15 ENCOUNTER — Ambulatory Visit (HOSPITAL_BASED_OUTPATIENT_CLINIC_OR_DEPARTMENT_OTHER): Payer: 59 | Admitting: Pharmacist

## 2014-11-15 ENCOUNTER — Other Ambulatory Visit (HOSPITAL_BASED_OUTPATIENT_CLINIC_OR_DEPARTMENT_OTHER): Payer: 59

## 2014-11-15 DIAGNOSIS — I2699 Other pulmonary embolism without acute cor pulmonale: Secondary | ICD-10-CM

## 2014-11-15 LAB — PROTIME-INR
INR: 3.3 (ref 2.00–3.50)
PROTIME: 39.6 s — AB (ref 10.6–13.4)

## 2014-11-15 LAB — POCT INR: INR: 3.3

## 2014-11-15 NOTE — Patient Instructions (Signed)
INR just above goal Take 5 mg tonight only then Continue 7.5 mg daily.  Recheck INR in 1 week on Friday 11/22/14 at 8:30am for lab and 8:45am for coumadin clinic

## 2014-11-15 NOTE — Progress Notes (Signed)
INR just above goal today at 3.3 (goal 2-3) Pt is doing well today but does report fatigue as well as lower back and chest pain No other symptoms to report No missed or extra doses No diet or medication changes (she has taken 1 or 2 tylenol 500 mg tablets for her pain) No unusual bleeding or bruising Pain is unlikely related to PE or a new PE due to stable INR at or above goal the last several weeks Looking back into patient history it appears that she has routinely had spontaneous chest/back pain for several days over the last month that resolves on its own (negative for PE on 3/4 in ED with similar symptoms) We will monitor this. I instructed patient to call immediately or go to ED if symptoms worsen or do not resolve Plan: Take 5 mg tonight only then Continue 7.5 mg daily.  Recheck INR in 1 week on Friday 11/22/14 at 8:30am for lab and 8:45am for coumadin clinic

## 2014-11-22 ENCOUNTER — Ambulatory Visit (HOSPITAL_BASED_OUTPATIENT_CLINIC_OR_DEPARTMENT_OTHER): Payer: Self-pay | Admitting: Pharmacist

## 2014-11-22 ENCOUNTER — Other Ambulatory Visit (HOSPITAL_BASED_OUTPATIENT_CLINIC_OR_DEPARTMENT_OTHER): Payer: 59

## 2014-11-22 DIAGNOSIS — I2699 Other pulmonary embolism without acute cor pulmonale: Secondary | ICD-10-CM | POA: Diagnosis not present

## 2014-11-22 LAB — CBC WITH DIFFERENTIAL/PLATELET
BASO%: 0.2 % (ref 0.0–2.0)
Basophils Absolute: 0 10*3/uL (ref 0.0–0.1)
EOS%: 2.3 % (ref 0.0–7.0)
Eosinophils Absolute: 0.1 10*3/uL (ref 0.0–0.5)
HEMATOCRIT: 38.7 % (ref 34.8–46.6)
HEMOGLOBIN: 12.7 g/dL (ref 11.6–15.9)
LYMPH%: 27.9 % (ref 14.0–49.7)
MCH: 29.3 pg (ref 25.1–34.0)
MCHC: 32.8 g/dL (ref 31.5–36.0)
MCV: 89.4 fL (ref 79.5–101.0)
MONO#: 0.6 10*3/uL (ref 0.1–0.9)
MONO%: 10.6 % (ref 0.0–14.0)
NEUT%: 59 % (ref 38.4–76.8)
NEUTROS ABS: 3.6 10*3/uL (ref 1.5–6.5)
Platelets: 295 10*3/uL (ref 145–400)
RBC: 4.33 10*6/uL (ref 3.70–5.45)
RDW: 13.5 % (ref 11.2–14.5)
WBC: 6.1 10*3/uL (ref 3.9–10.3)
lymph#: 1.7 10*3/uL (ref 0.9–3.3)

## 2014-11-22 LAB — PROTIME-INR
INR: 2.9 (ref 2.00–3.50)
Protime: 34.8 Seconds — ABNORMAL HIGH (ref 10.6–13.4)

## 2014-11-22 LAB — POCT INR: INR: 2.9

## 2014-11-22 LAB — COMPREHENSIVE METABOLIC PANEL (CC13)
ALBUMIN: 3.8 g/dL (ref 3.5–5.0)
ALK PHOS: 62 U/L (ref 40–150)
ALT: 12 U/L (ref 0–55)
AST: 14 U/L (ref 5–34)
Anion Gap: 10 mEq/L (ref 3–11)
BILIRUBIN TOTAL: 0.27 mg/dL (ref 0.20–1.20)
BUN: 9.1 mg/dL (ref 7.0–26.0)
CO2: 21 meq/L — AB (ref 22–29)
CREATININE: 0.8 mg/dL (ref 0.6–1.1)
Calcium: 9.2 mg/dL (ref 8.4–10.4)
Chloride: 107 mEq/L (ref 98–109)
EGFR: >90 mL/min/{1.73_m2} (ref >90)
Glucose: 84 mg/dl (ref 70–140)
POTASSIUM: 4.1 meq/L (ref 3.5–5.1)
SODIUM: 138 meq/L (ref 136–145)
Total Protein: 6.8 g/dL (ref 6.4–8.3)

## 2014-11-22 MED ORDER — WARFARIN SODIUM 5 MG PO TABS: ORAL_TABLET | ORAL | Status: DC

## 2014-11-22 NOTE — Progress Notes (Signed)
INR = 2.9  Pt took 5 mg x 1 dose last week as directed (her INR = 3.3) then continued Coumadin 7.5 mg daily. No bleeding/bruising. She has missed no doses of Coumadin. She still has back pain but uses APAP on occasion to help.  She is not being seen by any MD about this issue. INR at goal.  Continue Coumadin 7.5 mg daily. Return in 2 weeks. Refill e-RX'd to her pharmacy today. Kennith Center, Pharm.D., CPP 11/22/2014@9 :16 AM

## 2014-12-06 ENCOUNTER — Telehealth: Payer: Self-pay | Admitting: Hematology

## 2014-12-06 ENCOUNTER — Other Ambulatory Visit (HOSPITAL_BASED_OUTPATIENT_CLINIC_OR_DEPARTMENT_OTHER): Payer: 59

## 2014-12-06 ENCOUNTER — Ambulatory Visit (HOSPITAL_COMMUNITY)
Admission: RE | Admit: 2014-12-06 | Discharge: 2014-12-06 | Disposition: A | Discharge status: Home / Self Care | Payer: 59 | Source: Ambulatory Visit | Attending: Hematology | Admitting: Hematology

## 2014-12-06 ENCOUNTER — Other Ambulatory Visit: Payer: Self-pay | Admitting: *Deleted

## 2014-12-06 ENCOUNTER — Ambulatory Visit (HOSPITAL_BASED_OUTPATIENT_CLINIC_OR_DEPARTMENT_OTHER): Payer: 59 | Admitting: Pharmacist

## 2014-12-06 ENCOUNTER — Ambulatory Visit (HOSPITAL_BASED_OUTPATIENT_CLINIC_OR_DEPARTMENT_OTHER): Payer: 59

## 2014-12-06 VITALS — BP 125/84 | HR 88 | Resp 16 | Wt 166.3 lb

## 2014-12-06 DIAGNOSIS — I2699 Other pulmonary embolism without acute cor pulmonale: Secondary | ICD-10-CM | POA: Diagnosis not present

## 2014-12-06 DIAGNOSIS — M79609 Pain in unspecified limb: Secondary | ICD-10-CM | POA: Diagnosis not present

## 2014-12-06 DIAGNOSIS — M7989 Other specified soft tissue disorders: Secondary | ICD-10-CM | POA: Diagnosis present

## 2014-12-06 DIAGNOSIS — Z86711 Personal history of pulmonary embolism: Secondary | ICD-10-CM | POA: Diagnosis not present

## 2014-12-06 LAB — PROTIME-INR
INR: 1 — ABNORMAL LOW (ref 2.00–3.50)
PROTIME: 12 s (ref 10.6–13.4)

## 2014-12-06 LAB — POCT INR: INR: 1

## 2014-12-06 MED ORDER — ENOXAPARIN SODIUM 80 MG/0.8ML ~~LOC~~ SOLN: 80.0000 mg | Freq: Two times a day (BID) | SUBCUTANEOUS | Status: DC

## 2014-12-06 MED ORDER — ENOXAPARIN SODIUM 80 MG/0.8ML ~~LOC~~ SOLN
80.0000 mg | Freq: Once | SUBCUTANEOUS | Status: AC
  Administered 2014-12-06: 80 mg via SUBCUTANEOUS
  Filled 2014-12-06: qty 0.8

## 2014-12-06 NOTE — Telephone Encounter (Signed)
added pt inj appt per [facility]

## 2014-12-06 NOTE — Progress Notes (Signed)
VASCULAR LAB PRELIMINARY  PRELIMINARY  PRELIMINARY  PRELIMINARY  Left lower extremity venous duplex completed.    Preliminary report:  Left:  No evidence of DVT, superficial thrombosis, or Baker's cyst.  Nejla Reasor, RVS 12/06/2014, 12:49 PM

## 2014-12-06 NOTE — Progress Notes (Signed)
INR = 1 today on Coumadin 7.5 mg daily. VSS today. Wt = 166 # = 75 kg She has been previously stable/within goal INR on 7.5 mg/day. Pt states no missed doses of Coumadin. No recent med changes.  No new herbals/nutritional supplements/OTC's. Diet: no increased vit K intake. Pt had SOB yesterday and when walking in today to her appt.  One episode of CP this AM but it has since subsided. LLE swelling.  Posterior L knee swollen/painful to touch. INR subtherapeutic.  Pt symptomatic.   Dr. Burr Medico present to examine pt and will coordinate LE Doppler.   If Doppler is neg for DVT, will bridge w/ Lovenox 80 mg SQ Q12 hrs until INR back within goal. If Doppler is pos for DVT, plan to continue Lovenox for longer term (1-2 months). First dose of Lovenox 80 mg in office today. Increase Coumadin today & over the weekend to 10 mg daily. Recheck INR Mon 12/09/14. Kennith Center, Pharm.D., CPP 12/06/2014@9 :34 AM

## 2014-12-06 NOTE — Progress Notes (Signed)
Injection given by desk nurse 

## 2014-12-06 NOTE — Telephone Encounter (Signed)
per Vergia Alcon to sch pt CC-pt aware

## 2014-12-09 ENCOUNTER — Other Ambulatory Visit (HOSPITAL_BASED_OUTPATIENT_CLINIC_OR_DEPARTMENT_OTHER): Payer: 59

## 2014-12-09 ENCOUNTER — Ambulatory Visit (HOSPITAL_BASED_OUTPATIENT_CLINIC_OR_DEPARTMENT_OTHER): Payer: 59 | Admitting: Pharmacist

## 2014-12-09 DIAGNOSIS — I2699 Other pulmonary embolism without acute cor pulmonale: Secondary | ICD-10-CM

## 2014-12-09 LAB — PROTIME-INR
INR: 1 — ABNORMAL LOW (ref 2.00–3.50)
Protime: 12 Seconds (ref 10.6–13.4)

## 2014-12-09 LAB — POCT INR: INR: 1

## 2014-12-09 NOTE — Progress Notes (Signed)
INR remains at 1 despite coumadin dose increase last week.  Leslie Owen has no changes in medications.  She has not started any supplements or vitamins.  She states that there are no changes in color or shape of the warfarin she is currently taking.  Leslie Owen INR has been > 2 on warfarin 7.5mg  daily since we started seeing her in coumadin clinic.  She will continue Lovenox 80mg  sq bid.  I have given Leslie Owen coumadin 10mg  samples to start today instead of taking her warfarin prescription that she has at home.  She will take daily and will check PT/INR on 12/12/14.

## 2014-12-10 ENCOUNTER — Telehealth: Payer: Self-pay | Admitting: Hematology

## 2014-12-10 NOTE — Telephone Encounter (Signed)
per pof to sch pt CC-pt aware

## 2014-12-12 ENCOUNTER — Other Ambulatory Visit (HOSPITAL_BASED_OUTPATIENT_CLINIC_OR_DEPARTMENT_OTHER): Payer: 59

## 2014-12-12 ENCOUNTER — Ambulatory Visit (HOSPITAL_BASED_OUTPATIENT_CLINIC_OR_DEPARTMENT_OTHER): Payer: 59 | Admitting: Pharmacist

## 2014-12-12 DIAGNOSIS — Z7901 Long term (current) use of anticoagulants: Secondary | ICD-10-CM

## 2014-12-12 DIAGNOSIS — I2699 Other pulmonary embolism without acute cor pulmonale: Secondary | ICD-10-CM

## 2014-12-12 LAB — CBC WITH DIFFERENTIAL/PLATELET
BASO%: 0.5 % (ref 0.0–2.0)
BASOS ABS: 0 10*3/uL (ref 0.0–0.1)
EOS%: 2.5 % (ref 0.0–7.0)
Eosinophils Absolute: 0.1 10*3/uL (ref 0.0–0.5)
HEMATOCRIT: 38.3 % (ref 34.8–46.6)
HEMOGLOBIN: 12.5 g/dL (ref 11.6–15.9)
LYMPH#: 1.8 10*3/uL (ref 0.9–3.3)
LYMPH%: 37.5 % (ref 14.0–49.7)
MCH: 29.4 pg (ref 25.1–34.0)
MCHC: 32.7 g/dL (ref 31.5–36.0)
MCV: 90 fL (ref 79.5–101.0)
MONO#: 0.4 10*3/uL (ref 0.1–0.9)
MONO%: 8.9 % (ref 0.0–14.0)
NEUT%: 50.6 % (ref 38.4–76.8)
NEUTROS ABS: 2.4 10*3/uL (ref 1.5–6.5)
Platelets: 278 10*3/uL (ref 145–400)
RBC: 4.26 10*6/uL (ref 3.70–5.45)
RDW: 13.9 % (ref 11.2–14.5)
WBC: 4.8 10*3/uL (ref 3.9–10.3)

## 2014-12-12 LAB — COMPREHENSIVE METABOLIC PANEL (CC13)
ALK PHOS: 59 U/L (ref 40–150)
ALT: 8 U/L (ref 0–55)
AST: 15 U/L (ref 5–34)
Albumin: 3.9 g/dL (ref 3.5–5.0)
Anion Gap: 9 mEq/L (ref 3–11)
BUN: 8.5 mg/dL (ref 7.0–26.0)
CHLORIDE: 107 meq/L (ref 98–109)
CO2: 25 meq/L (ref 22–29)
Calcium: 9.3 mg/dL (ref 8.4–10.4)
Creatinine: 0.9 mg/dL (ref 0.6–1.1)
EGFR: >90 mL/min/{1.73_m2} (ref >90)
Glucose: 103 mg/dl (ref 70–140)
Potassium: 3.5 mEq/L (ref 3.5–5.1)
SODIUM: 140 meq/L (ref 136–145)
TOTAL PROTEIN: 6.8 g/dL (ref 6.4–8.3)
Total Bilirubin: 0.52 mg/dL (ref 0.20–1.20)

## 2014-12-12 LAB — PROTIME-INR
INR: 1 — ABNORMAL LOW (ref 2.00–3.50)
PROTIME: 12 s (ref 10.6–13.4)

## 2014-12-12 LAB — POCT INR: INR: 1

## 2014-12-12 NOTE — Progress Notes (Signed)
INR = 1    Goal 2-3 INR remains at baseline despite increased dose. Patient has been taking increased dose of Coumadin 10 mg daily for 3 days with no increase in INR. Samples were given to the patient at the last visit, she has been taking name brand Coumadin. She has had no medication or dietary changes. She has not started any dietary supplements or vitamins. She is currently anticoagulated with Lovenox 80 mg q12h. Continue Lovenox 80 mg subcutaneous injections every 12 hours. Increase Coumadin to 15 mg daily. Recheck INR Monday 5/23 at 1:30 pm for lab and 1:45 pm for Coumadin clinic.  Samples given:  Coumadin 10 mg #10   Lot:  [id number]   Exp:  11/16                            Coumadin 5 mg #5       Lot:  [id number]   Exp:  5/17  Theone Murdoch, PharmD

## 2014-12-16 ENCOUNTER — Ambulatory Visit (HOSPITAL_BASED_OUTPATIENT_CLINIC_OR_DEPARTMENT_OTHER): Payer: 59

## 2014-12-16 ENCOUNTER — Telehealth: Payer: Self-pay | Admitting: Hematology

## 2014-12-16 ENCOUNTER — Ambulatory Visit (HOSPITAL_BASED_OUTPATIENT_CLINIC_OR_DEPARTMENT_OTHER): Payer: Self-pay | Admitting: Pharmacist

## 2014-12-16 DIAGNOSIS — I2699 Other pulmonary embolism without acute cor pulmonale: Secondary | ICD-10-CM

## 2014-12-16 DIAGNOSIS — Z7901 Long term (current) use of anticoagulants: Secondary | ICD-10-CM

## 2014-12-16 LAB — COMPREHENSIVE METABOLIC PANEL (CC13)
ALBUMIN: 3.8 g/dL (ref 3.5–5.0)
ALK PHOS: 54 U/L (ref 40–150)
ALT: 11 U/L (ref 0–55)
AST: 18 U/L (ref 5–34)
Anion Gap: 12 mEq/L — ABNORMAL HIGH (ref 3–11)
BILIRUBIN TOTAL: 0.27 mg/dL (ref 0.20–1.20)
BUN: 5.5 mg/dL — ABNORMAL LOW (ref 7.0–26.0)
CO2: 20 meq/L — AB (ref 22–29)
CREATININE: 0.8 mg/dL (ref 0.6–1.1)
Calcium: 8.8 mg/dL (ref 8.4–10.4)
Chloride: 107 mEq/L (ref 98–109)
Glucose: 97 mg/dl (ref 70–140)
Potassium: 4.1 mEq/L (ref 3.5–5.1)
Sodium: 139 mEq/L (ref 136–145)
Total Protein: 6.5 g/dL (ref 6.4–8.3)

## 2014-12-16 LAB — CBC WITH DIFFERENTIAL/PLATELET
BASO%: 0.6 % (ref 0.0–2.0)
Basophils Absolute: 0 10*3/uL (ref 0.0–0.1)
EOS ABS: 0.1 10*3/uL (ref 0.0–0.5)
EOS%: 3.6 % (ref 0.0–7.0)
HEMATOCRIT: 37.1 % (ref 34.8–46.6)
HGB: 12.2 g/dL (ref 11.6–15.9)
LYMPH%: 33.1 % (ref 14.0–49.7)
MCH: 29.6 pg (ref 25.1–34.0)
MCHC: 32.9 g/dL (ref 31.5–36.0)
MCV: 90 fL (ref 79.5–101.0)
MONO#: 0.4 10*3/uL (ref 0.1–0.9)
MONO%: 10.2 % (ref 0.0–14.0)
NEUT%: 52.5 % (ref 38.4–76.8)
NEUTROS ABS: 2.1 10*3/uL (ref 1.5–6.5)
Platelets: 261 10*3/uL (ref 145–400)
RBC: 4.12 10*6/uL (ref 3.70–5.45)
RDW: 14.3 % (ref 11.2–14.5)
WBC: 4 10*3/uL (ref 3.9–10.3)
lymph#: 1.3 10*3/uL (ref 0.9–3.3)

## 2014-12-16 LAB — PROTIME-INR
INR: 1.2 — AB (ref 2.00–3.50)
PROTIME: 14.4 s — AB (ref 10.6–13.4)

## 2014-12-16 LAB — POCT INR: INR: 1.2

## 2014-12-16 NOTE — Patient Instructions (Signed)
Continue Lovenox 80 mg subcutaneous injections every 12 hours. Continue Coumadin to 15 mg daily. Recheck INR Friday 5/27 at 8:30 am for lab and 8:45 am for Coumadin clinic

## 2014-12-16 NOTE — Telephone Encounter (Signed)
per Kolleen to sch CC-pt aware °

## 2014-12-16 NOTE — Progress Notes (Signed)
Pt seen in clinic today INR=1.2 Pt started lovenox and increased coumadin to 15 mg daily last Thur Today INR slightly increased She states she has not taken any new meds or had any diet changes No missed doses of lovenox or coumadin Slow recovery to INR strange since she was therapeutic on a half her current dose (7.5mg  daily vs 15mg  daily) just a month ago  Samples provided Coumadin 5mg  #30 [id number]  5/17 Coumadin 10mg  #30 [id number] 11/16  Continue Lovenox 80 mg subcutaneous injections every 12 hours. Continue Coumadin to 15 mg daily. Recheck INR Friday 5/27 at 8:30 am for lab and 8:45 am for Coumadin clinic

## 2014-12-18 ENCOUNTER — Encounter (HOSPITAL_COMMUNITY): Payer: Self-pay

## 2014-12-18 ENCOUNTER — Emergency Department (HOSPITAL_COMMUNITY): Payer: 59

## 2014-12-18 ENCOUNTER — Emergency Department (HOSPITAL_COMMUNITY)
Admission: EM | Admit: 2014-12-18 | Discharge: 2014-12-18 | Disposition: A | Discharge status: Home / Self Care | Payer: 59 | Attending: Emergency Medicine | Admitting: Emergency Medicine

## 2014-12-18 DIAGNOSIS — Z7901 Long term (current) use of anticoagulants: Secondary | ICD-10-CM | POA: Diagnosis not present

## 2014-12-18 DIAGNOSIS — R1013 Epigastric pain: Secondary | ICD-10-CM | POA: Insufficient documentation

## 2014-12-18 DIAGNOSIS — Z9049 Acquired absence of other specified parts of digestive tract: Secondary | ICD-10-CM | POA: Diagnosis not present

## 2014-12-18 DIAGNOSIS — Z8742 Personal history of other diseases of the female genital tract: Secondary | ICD-10-CM | POA: Insufficient documentation

## 2014-12-18 DIAGNOSIS — Z87891 Personal history of nicotine dependence: Secondary | ICD-10-CM | POA: Diagnosis not present

## 2014-12-18 DIAGNOSIS — Z872 Personal history of diseases of the skin and subcutaneous tissue: Secondary | ICD-10-CM | POA: Diagnosis not present

## 2014-12-18 DIAGNOSIS — Z8719 Personal history of other diseases of the digestive system: Secondary | ICD-10-CM | POA: Diagnosis not present

## 2014-12-18 DIAGNOSIS — Z86711 Personal history of pulmonary embolism: Secondary | ICD-10-CM | POA: Insufficient documentation

## 2014-12-18 DIAGNOSIS — R109 Unspecified abdominal pain: Secondary | ICD-10-CM

## 2014-12-18 DIAGNOSIS — R1010 Upper abdominal pain, unspecified: Secondary | ICD-10-CM | POA: Diagnosis present

## 2014-12-18 DIAGNOSIS — Z9889 Other specified postprocedural states: Secondary | ICD-10-CM | POA: Diagnosis not present

## 2014-12-18 LAB — COMPREHENSIVE METABOLIC PANEL
ALBUMIN: 4.1 g/dL (ref 3.5–5.0)
ALK PHOS: 54 U/L (ref 38–126)
ALT: 12 U/L — AB (ref 14–54)
ANION GAP: 7 (ref 5–15)
AST: 18 U/L (ref 15–41)
BUN: 9 mg/dL (ref 6–20)
CHLORIDE: 106 mmol/L (ref 101–111)
CO2: 23 mmol/L (ref 22–32)
Calcium: 9.1 mg/dL (ref 8.9–10.3)
Creatinine, Ser: 0.77 mg/dL (ref 0.44–1.00)
GFR calc Af Amer: >60 mL/min (ref >60)
GFR calc non Af Amer: >60 mL/min (ref >60)
Glucose, Bld: 97 mg/dL (ref 65–99)
Potassium: 4.1 mmol/L (ref 3.5–5.1)
Sodium: 136 mmol/L (ref 135–145)
TOTAL PROTEIN: 7.2 g/dL (ref 6.5–8.1)
Total Bilirubin: 0.6 mg/dL (ref 0.3–1.2)

## 2014-12-18 LAB — CBC WITH DIFFERENTIAL/PLATELET
BASOS PCT: 0 % (ref 0–1)
Basophils Absolute: 0 10*3/uL (ref 0.0–0.1)
EOS ABS: 0.1 10*3/uL (ref 0.0–0.7)
Eosinophils Relative: 3 % (ref 0–5)
HEMATOCRIT: 39.8 % (ref 36.0–46.0)
Hemoglobin: 12.8 g/dL (ref 12.0–15.0)
Lymphocytes Relative: 42 % (ref 12–46)
Lymphs Abs: 2 10*3/uL (ref 0.7–4.0)
MCH: 29.3 pg (ref 26.0–34.0)
MCHC: 32.2 g/dL (ref 30.0–36.0)
MCV: 91.1 fL (ref 78.0–100.0)
Monocytes Absolute: 0.4 10*3/uL (ref 0.1–1.0)
Monocytes Relative: 9 % (ref 3–12)
Neutro Abs: 2.2 10*3/uL (ref 1.7–7.7)
Neutrophils Relative %: 46 % (ref 43–77)
PLATELETS: 301 10*3/uL (ref 150–400)
RBC: 4.37 MIL/uL (ref 3.87–5.11)
RDW: 13.7 % (ref 11.5–15.5)
WBC: 4.8 10*3/uL (ref 4.0–10.5)

## 2014-12-18 LAB — URINALYSIS, ROUTINE W REFLEX MICROSCOPIC
BILIRUBIN URINE: NEGATIVE
GLUCOSE, UA: NEGATIVE mg/dL
Hgb urine dipstick: NEGATIVE
KETONES UR: NEGATIVE mg/dL
LEUKOCYTES UA: NEGATIVE
NITRITE: NEGATIVE
Protein, ur: NEGATIVE mg/dL
Specific Gravity, Urine: 1.008 (ref 1.005–1.030)
Urobilinogen, UA: 0.2 mg/dL (ref 0.0–1.0)
pH: 7.5 (ref 5.0–8.0)

## 2014-12-18 LAB — PROTIME-INR
INR: 1.18 (ref 0.00–1.49)
PROTHROMBIN TIME: 15.2 s (ref 11.6–15.2)

## 2014-12-18 LAB — LIPASE, BLOOD: Lipase: 22 U/L (ref 22–51)

## 2014-12-18 MED ORDER — ONDANSETRON HCL 4 MG/2ML IJ SOLN
4.0000 mg | Freq: Once | INTRAMUSCULAR | Status: AC
  Administered 2014-12-18: 4 mg via INTRAVENOUS
  Filled 2014-12-18: qty 2

## 2014-12-18 MED ORDER — MORPHINE SULFATE 4 MG/ML IJ SOLN
4.0000 mg | Freq: Once | INTRAMUSCULAR | Status: AC
  Administered 2014-12-18: 4 mg via INTRAVENOUS
  Filled 2014-12-18: qty 1

## 2014-12-18 MED ORDER — SODIUM CHLORIDE 0.9 % IV SOLN
1000.0000 mL | Freq: Once | INTRAVENOUS | Status: AC
  Administered 2014-12-18: 1000 mL via INTRAVENOUS

## 2014-12-18 MED ORDER — SODIUM CHLORIDE 0.9 % IV SOLN
1000.0000 mL | INTRAVENOUS | Status: DC
  Administered 2014-12-18: 1000 mL via INTRAVENOUS

## 2014-12-18 NOTE — ED Notes (Signed)
Pt woke up this am at 5 with mid abdominal pain radiating to bil back.  Pt states nausea with no vomiting.  No fever, change in urination or bowels.

## 2014-12-18 NOTE — ED Provider Notes (Signed)
CSN: [id number]     Arrival date & time 12/18/14  1020 History   First MD Initiated Contact with Patient 12/18/14 1027     Chief Complaint  Patient presents with  . Abdominal Pain    HPI Patient woke up this morning with pain in her mid and upper abdomen radiating to both sides and towards her back. It started about 5 AM. She has felt nauseated but has not vomited. The pain is cramping. She denies any trouble with dysuria. No issues with constipation or diarrhea. No fevers or chills. No chest pain or shortness of breath. Patient does have history of prior abdominal surgeries including a cholecystectomy, hysterectomy, myomectomy, and laparoscopic procedures for endometriosis. Past Medical History  Diagnosis Date  . Ulcer   . Encounter for diagnostic endoscopy   . Uterine fibroids affecting pregnancy   . Gall bladder stones   . Anemia   . Pulmonary embolus    Past Surgical History  Procedure Laterality Date  . Cesarean section    . Myomectomy    . Laporoscopy    . Ovarian cysts    . Wisdom tooth extraction    . Cholecystectomy    . Abdominal hysterectomy     Family History  Problem Relation Age of Onset  . Arthritis Mother   . Hypertension Mother   . Heart disease Father   . Cancer Maternal Aunt     leukemia   . Cancer Maternal Grandfather 60    lung cancer   . Cancer Maternal Aunt     breast ca  . Cancer Maternal Aunt     lung ca   History  Substance Use Topics  . Smoking status: Former Smoker -- 0.25 packs/day for .5 years    Types: Cigarettes    Quit date: 10/09/2001  . Smokeless tobacco: Never Used  . Alcohol Use: No   OB History    Gravida Para Term Preterm AB TAB SAB Ectopic Multiple Living   3 2 1 1      2      Review of Systems  All other systems reviewed and are negative.     Allergies  Review of patient's allergies indicates no known allergies.  Home Medications   Prior to Admission medications   Medication Sig Start Date End Date Taking?  Authorizing Provider  Acetaminophen 500 MG coapsule Take 1 capsule by mouth every 6 (six) hours as needed for pain.   Yes Historical Provider, MD  enoxaparin (LOVENOX) 80 MG/0.8ML injection Inject 0.8 mLs (80 mg total) into the skin every 12 (twelve) hours. 12/06/14  Yes Truitt Merle, MD  oxyCODONE-acetaminophen (PERCOCET/ROXICET) 5-325 MG per tablet Take 1-2 tablets by mouth every 4 (four) hours as needed for severe pain. 09/14/14  Yes Lacretia Leigh, MD  warfarin (COUMADIN) 10 MG tablet Take 10 mg by mouth daily. Takes with 5 mg tablet to equal 15 mg daily.   Yes Historical Provider, MD  warfarin (COUMADIN) 5 MG tablet Take 7.5 mg by mouth daily or as directed Patient taking differently: Take 5 mg by mouth daily. Takes with 10 mg tablet to equal 15 mg daily. 11/22/14  Yes Truitt Merle, MD   BP 110/63 mmHg  Pulse 75  Temp(Src) 98.4 F (36.9 C) (Oral)  Resp 14  SpO2 100% Physical Exam  Constitutional: She appears well-developed and well-nourished. No distress.  HENT:  Head: Normocephalic and atraumatic.  Right Ear: External ear normal.  Left Ear: External ear normal.  Eyes: Conjunctivae are normal.  Right eye exhibits no discharge. Left eye exhibits no discharge. No scleral icterus.  Neck: Neck supple. No tracheal deviation present.  Cardiovascular: Normal rate, regular rhythm and intact distal pulses.   Pulmonary/Chest: Effort normal and breath sounds normal. No stridor. No respiratory distress. She has no wheezes. She has no rales.  Abdominal: Soft. Bowel sounds are normal. She exhibits no distension. There is tenderness in the epigastric area. There is no rebound and no guarding.  Musculoskeletal: She exhibits no edema or tenderness.  Neurological: She is alert. She has normal strength. No cranial nerve deficit (no facial droop, extraocular movements intact, no slurred speech) or sensory deficit. She exhibits normal muscle tone. She displays no seizure activity. Coordination normal.  Skin: Skin is  warm and dry. No rash noted.  Psychiatric: She has a normal mood and affect.  Nursing note and vitals reviewed.   ED Course  Procedures (including critical care time) Labs Review Labs Reviewed  COMPREHENSIVE METABOLIC PANEL - Abnormal; Notable for the following:    ALT 12 (*)    All other components within normal limits  CBC WITH DIFFERENTIAL/PLATELET  LIPASE, BLOOD  URINALYSIS, ROUTINE W REFLEX MICROSCOPIC  PROTIME-INR    Imaging Review Dg Abd Acute W/chest  12/18/2014   CLINICAL DATA:  Mid abdominal pain radiating to mid back with some nausea beginning this morning.  EXAM: DG ABDOMEN ACUTE W/ 1V CHEST  COMPARISON:  Chest x-ray 10/22/2014 as well as acute abdominal series 10/09/2015  FINDINGS: Lungs are adequately inflated without consolidation or effusion. Cardiomediastinal silhouette and remainder of the chest is unchanged.  Abdominal pelvic images demonstrate a nonobstructive bowel gas pattern with minimal fecal retention over the hepatic hepatic and splenic flexures. No free peritoneal air. Surgical clips over the gallbladder fossa. No evidence of mass or mass effect. There is mild biphasic curvature of the thoracolumbar spine unchanged. Mild sclerosis over the symphysis pubis joint unchanged.  IMPRESSION: Nonobstructive bowel gas pattern.  No acute cardiopulmonary disease.   Electronically Signed   By: Marin Olp M.D.   On: 12/18/2014 11:40     MDM   Final diagnoses:  Abdominal pain, unspecified abdominal location    Labs and xrays are reasuring.  Abd exam is benign.  Doubt obstruction or acute infection.  Pt does have history of prior surgery.  It is possible her symptoms were transiently related to adhesions.  Discussed outpatient follow up and warning signs, precautions.    Dorie Rank, MD 12/18/14 [phone]

## 2014-12-18 NOTE — Discharge Instructions (Signed)

## 2014-12-20 ENCOUNTER — Ambulatory Visit (HOSPITAL_BASED_OUTPATIENT_CLINIC_OR_DEPARTMENT_OTHER): Payer: 59 | Admitting: Pharmacist

## 2014-12-20 ENCOUNTER — Other Ambulatory Visit: Payer: 59

## 2014-12-20 DIAGNOSIS — I2699 Other pulmonary embolism without acute cor pulmonale: Secondary | ICD-10-CM | POA: Diagnosis not present

## 2014-12-20 DIAGNOSIS — Z7901 Long term (current) use of anticoagulants: Secondary | ICD-10-CM | POA: Diagnosis not present

## 2014-12-20 LAB — PROTIME-INR
INR: 1.3 — ABNORMAL LOW (ref 2.00–3.50)
PROTIME: 15.6 s — AB (ref 10.6–13.4)

## 2014-12-20 LAB — POCT INR: INR: 1.3

## 2014-12-20 NOTE — Progress Notes (Addendum)
INR continues to be below goal. Pt taking coumadin and Lovenox as instructed. Pt was seen in ED for abdominal pain on 12/18/14. No new medications given. Pain is likely related to previous surgeries per ER MD. INR = 1.18 No changes in diet or medications. No missed or extra coumadin doses. No unusual bruising. No bleeding noted. No s/s of clotting noted. I asked patient if she wanted to discontinue coumadin (due recent repeat trips to clinic, labs, etc) and remain on lovenox to complete anticoagulation. She prefers to be on coumadin. Continue Lovenox 80 mg subcutaneous injections every 12 hours. Increase  Coumadin to 17.5mg  daily. Recheck INR on 12/26/14; Lab at 8:30am and Coumadin clinic at 8:45am. Pt is unable to come any sooner due to mandatory overtime requirements at work and her son's school graduation.

## 2014-12-20 NOTE — Patient Instructions (Addendum)
Continue Lovenox 80 mg subcutaneous injections every 12 hours. Increase  Coumadin to 17.5mg  daily. Recheck INR on 12/26/14; Lab at 8:30am and Coumadin clinic at 8:45am.

## 2014-12-26 ENCOUNTER — Ambulatory Visit (HOSPITAL_BASED_OUTPATIENT_CLINIC_OR_DEPARTMENT_OTHER): Payer: 59 | Admitting: Pharmacist

## 2014-12-26 ENCOUNTER — Other Ambulatory Visit (HOSPITAL_BASED_OUTPATIENT_CLINIC_OR_DEPARTMENT_OTHER): Payer: 59

## 2014-12-26 DIAGNOSIS — I2699 Other pulmonary embolism without acute cor pulmonale: Secondary | ICD-10-CM

## 2014-12-26 DIAGNOSIS — Z7901 Long term (current) use of anticoagulants: Secondary | ICD-10-CM

## 2014-12-26 LAB — PROTIME-INR
INR: 2.4 (ref 2.00–3.50)
Protime: 28.8 Seconds — ABNORMAL HIGH (ref 10.6–13.4)

## 2014-12-26 LAB — CBC WITH DIFFERENTIAL/PLATELET
BASO%: 0.6 % (ref 0.0–2.0)
Basophils Absolute: 0 10*3/uL (ref 0.0–0.1)
EOS ABS: 0.1 10*3/uL (ref 0.0–0.5)
EOS%: 2.6 % (ref 0.0–7.0)
HCT: 38.6 % (ref 34.8–46.6)
HGB: 12.8 g/dL (ref 11.6–15.9)
LYMPH%: 31.3 % (ref 14.0–49.7)
MCH: 29.7 pg (ref 25.1–34.0)
MCHC: 33.1 g/dL (ref 31.5–36.0)
MCV: 89.6 fL (ref 79.5–101.0)
MONO#: 0.3 10*3/uL (ref 0.1–0.9)
MONO%: 6.6 % (ref 0.0–14.0)
NEUT#: 2.4 10*3/uL (ref 1.5–6.5)
NEUT%: 58.9 % (ref 38.4–76.8)
PLATELETS: 267 10*3/uL (ref 145–400)
RBC: 4.31 10*6/uL (ref 3.70–5.45)
RDW: 14.4 % (ref 11.2–14.5)
WBC: 4 10*3/uL (ref 3.9–10.3)
lymph#: 1.3 10*3/uL (ref 0.9–3.3)

## 2014-12-26 LAB — COMPREHENSIVE METABOLIC PANEL (CC13)
ALT: 12 U/L (ref 0–55)
AST: 18 U/L (ref 5–34)
Albumin: 3.8 g/dL (ref 3.5–5.0)
Alkaline Phosphatase: 57 U/L (ref 40–150)
Anion Gap: 7 mEq/L (ref 3–11)
BUN: 7.9 mg/dL (ref 7.0–26.0)
CALCIUM: 8.9 mg/dL (ref 8.4–10.4)
CO2: 23 meq/L (ref 22–29)
Chloride: 109 mEq/L (ref 98–109)
Creatinine: 0.9 mg/dL (ref 0.6–1.1)
Glucose: 130 mg/dl (ref 70–140)
Potassium: 3.8 mEq/L (ref 3.5–5.1)
Sodium: 139 mEq/L (ref 136–145)
TOTAL PROTEIN: 6.9 g/dL (ref 6.4–8.3)
Total Bilirubin: 0.26 mg/dL (ref 0.20–1.20)

## 2014-12-26 LAB — POCT INR: INR: 2.4

## 2014-12-26 NOTE — Patient Instructions (Signed)
INR at goal Stop lovenox.  Continue  Coumadin to 17.5mg  daily. Recheck INR on 01/01/15; Lab at 8:30am and Coumadin clinic at 8:45am.

## 2014-12-26 NOTE — Progress Notes (Signed)
INR has finally returned to goal today at 2.4 (goal 2-3) after multiple dose increases Pt is doing well with no complaints Ms. Dukeman can stop the lovenox shots No unusual bleeding or bruising No diet or medication changes No missed or extra doses. Pt took 17.5 mg daily as instructed beginning on 5/27 Plan: Stop lovenox.  Continue  Coumadin 17.5mg  daily. Recheck INR on 01/01/15; Lab at 8:30am and Coumadin clinic at 8:45am.  Samples provided:  Coumadin 5 mg tablets x 30 tabs Lot: [id number] Exp: 11/2015

## 2015-01-01 ENCOUNTER — Ambulatory Visit (HOSPITAL_BASED_OUTPATIENT_CLINIC_OR_DEPARTMENT_OTHER): Payer: 59 | Admitting: Pharmacist

## 2015-01-01 ENCOUNTER — Telehealth: Payer: Self-pay | Admitting: Hematology

## 2015-01-01 ENCOUNTER — Other Ambulatory Visit (HOSPITAL_BASED_OUTPATIENT_CLINIC_OR_DEPARTMENT_OTHER): Payer: 59

## 2015-01-01 DIAGNOSIS — Z7901 Long term (current) use of anticoagulants: Secondary | ICD-10-CM | POA: Diagnosis not present

## 2015-01-01 DIAGNOSIS — I2699 Other pulmonary embolism without acute cor pulmonale: Secondary | ICD-10-CM

## 2015-01-01 LAB — POCT INR: INR: 4.6

## 2015-01-01 LAB — PROTIME-INR
INR: 4.6 — AB (ref 2.00–3.50)
Protime: 55.2 Seconds — ABNORMAL HIGH (ref 10.6–13.4)

## 2015-01-01 NOTE — Telephone Encounter (Signed)
per [facility] in [place] to sch CC-pt aware

## 2015-01-01 NOTE — Progress Notes (Signed)
INR is now above goal at 4.6 after continually dose increases over the past month from 7.5mg  daily to now 17.5mg  daily.  No bleeding or bruising.  No med changes.  Leslie Owen INR looks to be slow to respond with coumadin dose changes.  Will hold coumadin today and decrease to 15mg  daily but 17.5mg  MWF.  For this week including holding today's dose, this will be about 20% coumadin dose reduction.  Will check PT/INR again in 1 week.  Coumadin samples given: Coumadin 5mg  x 5 tablets [id number] Exp 11/2015 Coumadin 5mg  x 10 tablets [id number] Exp 01/2016 Coumadin 10mg  x 16 tablets [id number] Exp 05/2015

## 2015-01-06 ENCOUNTER — Telehealth: Payer: Self-pay | Admitting: Hematology

## 2015-01-06 NOTE — Telephone Encounter (Signed)
pt cld to req earlier time for appt due to work hours--r/s and gave pt updated time same day

## 2015-01-08 ENCOUNTER — Encounter: Payer: Self-pay | Admitting: *Deleted

## 2015-01-08 ENCOUNTER — Other Ambulatory Visit (HOSPITAL_BASED_OUTPATIENT_CLINIC_OR_DEPARTMENT_OTHER): Payer: 59

## 2015-01-08 ENCOUNTER — Ambulatory Visit: Payer: 59 | Admitting: Pharmacist

## 2015-01-08 ENCOUNTER — Other Ambulatory Visit: Payer: 59

## 2015-01-08 ENCOUNTER — Ambulatory Visit: Payer: 59

## 2015-01-08 ENCOUNTER — Other Ambulatory Visit (HOSPITAL_COMMUNITY)
Admission: RE | Admit: 2015-01-08 | Discharge: 2015-01-08 | Disposition: A | Discharge status: Home / Self Care | Payer: 59 | Source: Ambulatory Visit | Attending: Hematology | Admitting: Hematology

## 2015-01-08 DIAGNOSIS — I2699 Other pulmonary embolism without acute cor pulmonale: Secondary | ICD-10-CM

## 2015-01-08 LAB — PROTIME-INR
INR: 6.54 (ref 0.00–1.49)
Prothrombin Time: 55 seconds — ABNORMAL HIGH (ref 11.6–15.2)

## 2015-01-08 NOTE — Progress Notes (Signed)
*  Telephone encounter only - no charge* INR = 6.54   Goal 2-3 INR was sent out for verification today. No bleeding or bruising. No extra doses. I spoke with Dr. Burr Medico regarding the recent fluctuations in this patient's INR. She was initially therapeutic on a dose of Coumadin 7.5 mg daily. Her INR then fell to baseline and her dose was gradually increased to a high of 17.5 mg daily. She is now supratherapeutic; current dose was 15 mg daily but 17.5 mg MWF. Dr. Burr Medico is willing to transition this patient to Xarelto if the patient wishes to change and her insurance will cover it adequately. I told the patient we could discuss this at her 6/21 appointment if she is interested. She will not take Coumadin today or tomorrow. We will decrease Coumadin to 10 mg daily. Recheck INR on 01/14/15; Lab at 3:45pm and Coumadin clinic at [place], PharmD

## 2015-01-08 NOTE — Progress Notes (Unsigned)
Critical INR given to Elray Buba at 1100 by Felizardo Hoffmann.

## 2015-01-09 ENCOUNTER — Telehealth: Payer: Self-pay | Admitting: Hematology

## 2015-01-09 NOTE — Telephone Encounter (Signed)
per Lattie Haw to sch pt CC-pt aware

## 2015-01-14 ENCOUNTER — Other Ambulatory Visit (HOSPITAL_BASED_OUTPATIENT_CLINIC_OR_DEPARTMENT_OTHER): Payer: 59

## 2015-01-14 ENCOUNTER — Ambulatory Visit (HOSPITAL_BASED_OUTPATIENT_CLINIC_OR_DEPARTMENT_OTHER): Payer: 59

## 2015-01-14 DIAGNOSIS — Z7901 Long term (current) use of anticoagulants: Secondary | ICD-10-CM

## 2015-01-14 DIAGNOSIS — I2699 Other pulmonary embolism without acute cor pulmonale: Secondary | ICD-10-CM | POA: Diagnosis not present

## 2015-01-14 LAB — PROTIME-INR
INR: 1.3 — AB (ref 2.00–3.50)
Protime: 15.6 Seconds — ABNORMAL HIGH (ref 10.6–13.4)

## 2015-01-14 LAB — POCT INR: INR: 1.3

## 2015-01-14 NOTE — Progress Notes (Signed)
Leslie Owen's INR is 1.3 today which is below her goal range of 2-3. Her INR was 6.54 ~ 1 week ago, after which she held her dose for two days and then started 10 mg daily as instructed. She reports no bleeding and/or unusual bruising. She did report some warmth and pain in her knee area, and I consulted Dr. Burr Medico to see if further work up was necessary and she was not concerned as we had checked Leslie Owen' pulse ox (100%), and the patient reported no shortness of breath.  She has not missed any doses, nor changed any medications or diet. She does not smoke or drink (if she did, that potentially could have explained some of the variability in her INR)  Given her high variability in INR recently, and the high daily doses she has been requiring, we had mentioned the possibility of changing to a direct oral anticoagulant such as Xarelto, and discussed the pros and cons of these medications. Leslie Owen is hesitant to change to one of these newer agents due to the risks she has seen advertised with the drug, and would like to continue on warfarin for now.   I reviewed this plan with Dr. Burr Medico who agrees that Xarelto might be the best choice in the future (we also ran a test script for the medication and it appears her insurance will cover it) but as Leslie Owen wants to continue with Coumadin for now, we will continue adjusting based on her INR. We also discussed the potential for utilizing daily low dose vitamin K in the future if we are still having difficulty stabilizing her INR as a future intervention to try. Of note, the plan is to continue anticoagulation for 6 months from the time of her PE which was in February.  We will increase her weekly dose, without bridging per discussion with Dr. Burr Medico, and will check her INR on Monday I would anticipate it will not be therapeutic at this time, as it appears her INR takes a while to increase, but hopefully it will be trending in the right direction.   Plan: Increase  Coumadin to 15 mg daily except 10 mg Monday, Friday, Sunday.  Recheck INR 6/27: lab at 8 am, CC at 8:15 am

## 2015-01-17 ENCOUNTER — Telehealth: Payer: Self-pay | Admitting: Hematology

## 2015-01-17 NOTE — Telephone Encounter (Signed)
per Marolyn Hammock to sch CC-pt aware

## 2015-01-20 ENCOUNTER — Ambulatory Visit (HOSPITAL_BASED_OUTPATIENT_CLINIC_OR_DEPARTMENT_OTHER): Payer: 59 | Admitting: Pharmacist

## 2015-01-20 ENCOUNTER — Other Ambulatory Visit (HOSPITAL_BASED_OUTPATIENT_CLINIC_OR_DEPARTMENT_OTHER): Payer: 59

## 2015-01-20 DIAGNOSIS — I2699 Other pulmonary embolism without acute cor pulmonale: Secondary | ICD-10-CM

## 2015-01-20 DIAGNOSIS — Z7901 Long term (current) use of anticoagulants: Secondary | ICD-10-CM | POA: Diagnosis not present

## 2015-01-20 LAB — PROTIME-INR
INR: 3.3 (ref 2.00–3.50)
Protime: 39.6 Seconds — ABNORMAL HIGH (ref 10.6–13.4)

## 2015-01-20 LAB — POCT INR: INR: 3.3

## 2015-01-20 NOTE — Progress Notes (Signed)
INR slightly above goal.  No bleeding/bruising.  No medication changes.  No other c/o.  INR jumped from 1.3 to 3.3 in 6 days.  Leslie Owen took 15mg  x 4 dose and 10mg  x 2 doses in the past 6 days.  Will slightly lower coumadin dose to try to avoid INR from increasing again.  Decrease coumadin to 15mg  Tu/Sat and 10mg  other days.  Will check PT/INR on Friday 7/1 (Leslie Veilleux prefers to come 7/1 over 7/5).  Hopefully we can increase INR checks after 7/1 appt.

## 2015-01-21 ENCOUNTER — Telehealth: Payer: Self-pay | Admitting: Hematology

## 2015-01-21 NOTE — Telephone Encounter (Signed)
per Melissa to sch CC-pt aware °

## 2015-01-22 ENCOUNTER — Ambulatory Visit (INDEPENDENT_AMBULATORY_CARE_PROVIDER_SITE_OTHER): Payer: 59 | Admitting: Gynecology

## 2015-01-22 ENCOUNTER — Encounter: Payer: Self-pay | Admitting: Gynecology

## 2015-01-22 ENCOUNTER — Telehealth: Payer: Self-pay | Admitting: *Deleted

## 2015-01-22 VITALS — BP 126/78 | Ht 63.0 in | Wt 164.0 lb

## 2015-01-22 DIAGNOSIS — N63 Unspecified lump in unspecified breast: Secondary | ICD-10-CM

## 2015-01-22 DIAGNOSIS — N644 Mastodynia: Secondary | ICD-10-CM | POA: Insufficient documentation

## 2015-01-22 DIAGNOSIS — Z01419 Encounter for gynecological examination (general) (routine) without abnormal findings: Secondary | ICD-10-CM | POA: Diagnosis not present

## 2015-01-22 DIAGNOSIS — R102 Pelvic and perineal pain: Secondary | ICD-10-CM | POA: Insufficient documentation

## 2015-01-22 LAB — LIPID PANEL
Cholesterol: 171 mg/dL (ref 0–200)
HDL: 56 mg/dL (ref >=46)
LDL Cholesterol: 100 mg/dL — ABNORMAL HIGH (ref 0–99)
Total CHOL/HDL Ratio: 3.1 Ratio
Triglycerides: 74 mg/dL (ref <150)
VLDL: 15 mg/dL (ref 0–40)

## 2015-01-22 LAB — TSH: TSH: 1.085 u[IU]/mL (ref 0.350–4.500)

## 2015-01-22 LAB — CBC WITH DIFFERENTIAL/PLATELET
Basophils Absolute: 0 10*3/uL (ref 0.0–0.1)
Basophils Relative: 0 % (ref 0–1)
Eosinophils Absolute: 0.1 10*3/uL (ref 0.0–0.7)
Eosinophils Relative: 1 % (ref 0–5)
HCT: 38.2 % (ref 36.0–46.0)
HEMOGLOBIN: 12.7 g/dL (ref 12.0–15.0)
LYMPHS ABS: 2.2 10*3/uL (ref 0.7–4.0)
LYMPHS PCT: 38 % (ref 12–46)
MCH: 29.1 pg (ref 26.0–34.0)
MCHC: 33.2 g/dL (ref 30.0–36.0)
MCV: 87.4 fL (ref 78.0–100.0)
MPV: 9.5 fL (ref 8.6–12.4)
Monocytes Absolute: 0.5 10*3/uL (ref 0.1–1.0)
Monocytes Relative: 8 % (ref 3–12)
NEUTROS PCT: 53 % (ref 43–77)
Neutro Abs: 3.1 10*3/uL (ref 1.7–7.7)
PLATELETS: 312 10*3/uL (ref 150–400)
RBC: 4.37 MIL/uL (ref 3.87–5.11)
RDW: 14.3 % (ref 11.5–15.5)
WBC: 5.9 10*3/uL (ref 4.0–10.5)

## 2015-01-22 LAB — COMPREHENSIVE METABOLIC PANEL
ALK PHOS: 57 U/L (ref 39–117)
ALT: 10 U/L (ref 0–35)
AST: 15 U/L (ref 0–37)
Albumin: 4.3 g/dL (ref 3.5–5.2)
BUN: 10 mg/dL (ref 6–23)
CALCIUM: 9.4 mg/dL (ref 8.4–10.5)
CO2: 22 mEq/L (ref 19–32)
CREATININE: 0.86 mg/dL (ref 0.50–1.10)
Chloride: 106 mEq/L (ref 96–112)
Glucose, Bld: 86 mg/dL (ref 70–99)
Potassium: 3.8 mEq/L (ref 3.5–5.3)
Sodium: 139 mEq/L (ref 135–145)
Total Bilirubin: 0.8 mg/dL (ref 0.2–1.2)
Total Protein: 7.1 g/dL (ref 6.0–8.3)

## 2015-01-22 NOTE — Telephone Encounter (Signed)
Appointment on 01/28/15 @ 2:40pm left message for pt to call.

## 2015-01-22 NOTE — Telephone Encounter (Signed)
Per [facility] note today "diagnostic mammogram possible ultrasound of left breast nodule"

## 2015-01-22 NOTE — Progress Notes (Signed)
Leslie Owen Jun 19, 1981 [id number]   History:    34 y.o.  for annual gyn exam:  Myomectomy Laparoscopy 2 for endometriosis  The above surgeries were done in [facility]  Patient had endometrial ablation in [place] Robotic hysterectomy for fibroid uterus and endometriosis in [place] 2015  Patient is complaining of dyspareunia and right lower abdominal pain. Patient would no GU or GI complaints. Patient also complaining of left breast tenderness and a questionable nodularity that she's felt for several months. She denies any nipple discharge or any past injury. Patient denies any past history of any abnormal Pap smears.  Patient early this year had presented to the emergency room with shortness of breath and rib pains it turned out she had a pulmonary embolism and she's currently on Coumadin and is being followed by the Coumadin clinic 1-2 times a week.   Past medical history,surgical history, family history and social history were all reviewed and documented in the EPIC chart.  Gynecologic History No LMP recorded. Patient has had a hysterectomy. Contraception: status post hysterectomy Last Pap: 2007. Results were: normal Last mammogram: No prior study. Results were: No prior study  Obstetric History OB History  Gravida Para Term Preterm AB SAB TAB Ectopic Multiple Living  3 2 1 1      3     # Outcome Date GA Lbr Len/2nd Weight Sex Delivery Anes PTL Lv  3 Gravida           2 Preterm           1 Term                ROS: A ROS was performed and pertinent positives and negatives are included in the history.  GENERAL: No fevers or chills. HEENT: No change in vision, no earache, sore throat or sinus congestion. NECK: No pain or stiffness. CARDIOVASCULAR: No chest pain or pressure. No palpitations. PULMONARY: No shortness of breath, cough or wheeze. GASTROINTESTINAL: No abdominal pain, nausea, vomiting or diarrhea, melena or bright red blood per rectum.  GENITOURINARY: No urinary frequency, urgency, hesitancy or dysuria. MUSCULOSKELETAL: No joint or muscle pain, no back pain, no recent trauma. DERMATOLOGIC: No rash, no itching, no lesions. ENDOCRINE: No polyuria, polydipsia, no heat or cold intolerance. No recent change in weight. HEMATOLOGICAL: No anemia or easy bruising or bleeding. NEUROLOGIC: No headache, seizures, numbness, tingling or weakness. PSYCHIATRIC: No depression, no loss of interest in normal activity or change in sleep pattern.     Exam: chaperone present  BP 126/78 mmHg  Ht 5\' 3"  (1.6 m)  Wt 164 lb (74.39 kg)  BMI 29.06 kg/m2  Body mass index is 29.06 kg/(m^2).  General appearance : Well developed well nourished female. No acute distress HEENT: Eyes: no retinal hemorrhage or exudates,  Neck supple, trachea midline, no carotid bruits, no thyroidmegaly Lungs: Clear to auscultation, no rhonchi or wheezes, or rib retractions  Heart: Regular rate and rhythm, no murmurs or gallops Breast:Examined in sitting and supine position were symmetrical in appearance, no palpable masses or tenderness,  no skin retraction, no nipple inversion, no nipple discharge, no skin discoloration, no axillary or supraclavicular lymphadenopathy Abdomen: no palpable masses or tenderness, no rebound or guarding Extremities: no edema or skin discoloration or tenderness  Pelvic: Physical Exam  Pulmonary/Chest:       Bartholin, Urethra, Skene Glands: Within normal limits             Vagina: No gross lesions or discharge  Cervix: Absent  Uterus absent  Adnexa  Without masses or tenderness  Anus and perineum  normal   Rectovaginal  normal sphincter tone without palpated masses or tenderness             Hemoccult not indicated     Assessment/Plan:  34 y.o. female for annual exam who was diagnosed with DVT in February this year currently on Coumadin therapy. Patient will be referred to the radiologist for diagnostic mammogram possible ultrasound of  left breast nodule as described above. Patient was tender on pelvic exam a pelvic ultrasound will be ordered to better assess the right ovary where she's been having a lot of discomfort recently. The following labs ordered today: CBC, comprehensive metabolic panel, fasting lipid profile, TSH, and urinalysis.   Terrance Mass MD, 1:52 PM 01/22/2015

## 2015-01-23 LAB — URINALYSIS W MICROSCOPIC + REFLEX CULTURE
BACTERIA UA: NONE SEEN
BILIRUBIN URINE: NEGATIVE
CASTS: NONE SEEN
Crystals: NONE SEEN
Glucose, UA: NEGATIVE mg/dL
Hgb urine dipstick: NEGATIVE
KETONES UR: NEGATIVE mg/dL
LEUKOCYTES UA: NEGATIVE
Nitrite: NEGATIVE
PH: 6 (ref 5.0–8.0)
PROTEIN: NEGATIVE mg/dL
Specific Gravity, Urine: 1.021 (ref 1.005–1.030)
Urobilinogen, UA: 0.2 mg/dL (ref 0.0–1.0)

## 2015-01-23 NOTE — Telephone Encounter (Signed)
Pt called back and told me to leave a detailed message on voicemail.

## 2015-01-24 ENCOUNTER — Other Ambulatory Visit (HOSPITAL_BASED_OUTPATIENT_CLINIC_OR_DEPARTMENT_OTHER): Payer: 59

## 2015-01-24 ENCOUNTER — Ambulatory Visit (HOSPITAL_BASED_OUTPATIENT_CLINIC_OR_DEPARTMENT_OTHER): Payer: 59

## 2015-01-24 DIAGNOSIS — I2699 Other pulmonary embolism without acute cor pulmonale: Secondary | ICD-10-CM | POA: Diagnosis not present

## 2015-01-24 LAB — POCT INR: INR: 1.1

## 2015-01-24 LAB — PROTIME-INR
INR: 1.1 — ABNORMAL LOW (ref 2.00–3.50)
Protime: 13.2 Seconds (ref 10.6–13.4)

## 2015-01-24 NOTE — Progress Notes (Signed)
Leslie Owen's INR is 1.1 today which is below her goal range of 2-3. Her INR has decreased significantly since we've seen her 4 days ago (we last saw her 6/27 at which time her INR was 3.3). She reports that she took 15 mg three times this past week (Monday, Tuesday, Wednesday) and 10 mg yesterday, which is even slightly more than what was advised per the last clinic note. Leslie Owen describes no dietary or medication changes. No bleeding and/or unusual bruising - she reports one bruise on her left foot and right calf but states they are healing appropriately. Her INR has been quite labile over the past weeks, and it has been difficult to achieve an INR within her goal range. We've previously discussed changing to a DOAC, but Leslie Owen prefers to stay on Coumadin.  The initial plan for her anticoagulation per Dr. Burr Medico was for 6 months total and then to re-evaluate. The end of this 6 month period is in August.   Since her INR dropped significantly, we will add one more day of 15 mg and see her within a week. She knows to contact us if any issues arise in the meantime.  Plan: Increase Coumadin to 15 mg on Fri, Sat, Mon, Wed, and 10 mg all other days. Recheck INR 7/8: lab at 8 am, Coumadin Clinic at 8:15 am

## 2015-01-28 ENCOUNTER — Ambulatory Visit
Admission: RE | Admit: 2015-01-28 | Discharge: 2015-01-28 | Disposition: A | Discharge status: Home / Self Care | Payer: 59 | Source: Ambulatory Visit | Attending: Gynecology | Admitting: Gynecology

## 2015-01-28 DIAGNOSIS — N63 Unspecified lump in unspecified breast: Secondary | ICD-10-CM

## 2015-01-31 ENCOUNTER — Ambulatory Visit (HOSPITAL_BASED_OUTPATIENT_CLINIC_OR_DEPARTMENT_OTHER): Payer: 59 | Admitting: Pharmacist

## 2015-01-31 ENCOUNTER — Other Ambulatory Visit (HOSPITAL_BASED_OUTPATIENT_CLINIC_OR_DEPARTMENT_OTHER): Payer: 59

## 2015-01-31 DIAGNOSIS — I2699 Other pulmonary embolism without acute cor pulmonale: Secondary | ICD-10-CM | POA: Diagnosis not present

## 2015-01-31 DIAGNOSIS — Z7901 Long term (current) use of anticoagulants: Secondary | ICD-10-CM | POA: Diagnosis not present

## 2015-01-31 LAB — PROTIME-INR
INR: 1.3 — ABNORMAL LOW (ref 2.00–3.50)
Protime: 15.6 Seconds — ABNORMAL HIGH (ref 10.6–13.4)

## 2015-01-31 LAB — POCT INR: INR: 1.3

## 2015-01-31 NOTE — Progress Notes (Signed)
INR = 1.3 Goal 2-3

## 2015-01-31 NOTE — Progress Notes (Addendum)
INR = 1.3    Goal 2-3 INR remains below goal range. No medication or dietary changes. No swelling of extremities or SOB.  INR remains low despite dose increases.  It has been very difficult to keep Mrs. Porcaro within her goal range. She remains subtherapeutic despite increased dosage today. She still prefers not to change to a DOAC at this time. Her PE was diagnosed on 09/10/14 and she will be on anticoagulation for a total of 6 months, so she has approximately 5 weeks remaining. Dr. Burr Medico is currently out of the office. On 01/14/15 when INR was 1.3, Dr. Burr Medico did not bridge with Lovenox so I will not bridge today. Discussed symptoms of DVT or PE with Mrs. Lites and she will contact us if she has any symptoms of clotting. I will increase Coumadin to 15 mg daily this week. She will return to Coumadin clinic in 1 week on 02/07/15; lab at 8am and Coumadin clinic at 8:15 am.  Theone Murdoch, PharmD  Samples given:  Coumadin 10 mg #40   Lot:  [id number]   Exp:  05/2015                            Coumadin 5 mg #40     Lot: [id number]    Exp:  01/2016

## 2015-02-07 ENCOUNTER — Ambulatory Visit (HOSPITAL_BASED_OUTPATIENT_CLINIC_OR_DEPARTMENT_OTHER): Payer: 59

## 2015-02-07 ENCOUNTER — Ambulatory Visit (HOSPITAL_COMMUNITY)
Admission: RE | Admit: 2015-02-07 | Discharge: 2015-02-07 | Disposition: A | Discharge status: Home / Self Care | Payer: 59 | Source: Ambulatory Visit | Attending: Hematology | Admitting: Hematology

## 2015-02-07 ENCOUNTER — Other Ambulatory Visit: Payer: Self-pay | Admitting: *Deleted

## 2015-02-07 ENCOUNTER — Other Ambulatory Visit: Payer: Self-pay | Admitting: Hematology

## 2015-02-07 ENCOUNTER — Ambulatory Visit (HOSPITAL_BASED_OUTPATIENT_CLINIC_OR_DEPARTMENT_OTHER): Payer: 59 | Admitting: Pharmacist

## 2015-02-07 DIAGNOSIS — M79604 Pain in right leg: Secondary | ICD-10-CM | POA: Diagnosis not present

## 2015-02-07 DIAGNOSIS — Z7901 Long term (current) use of anticoagulants: Secondary | ICD-10-CM | POA: Diagnosis not present

## 2015-02-07 DIAGNOSIS — I2699 Other pulmonary embolism without acute cor pulmonale: Secondary | ICD-10-CM | POA: Diagnosis not present

## 2015-02-07 DIAGNOSIS — M79605 Pain in left leg: Secondary | ICD-10-CM | POA: Diagnosis not present

## 2015-02-07 DIAGNOSIS — R609 Edema, unspecified: Secondary | ICD-10-CM | POA: Diagnosis not present

## 2015-02-07 DIAGNOSIS — M79606 Pain in leg, unspecified: Secondary | ICD-10-CM | POA: Diagnosis present

## 2015-02-07 LAB — PROTIME-INR
INR: 1 — AB (ref 2.00–3.50)
Protime: 12 Seconds (ref 10.6–13.4)

## 2015-02-07 LAB — POCT INR: INR: 1

## 2015-02-07 NOTE — Progress Notes (Signed)
INR = 1.0 INR below goal range and has fallen despite aggressive dosing. She states that she is taking Coumadin as directed. No new meds or dietary changes. Patient is complaining of pain in both lower extremities, swelling, and warmth in LLE. Dr. Burr Medico came in and examined the patient.  She went for a doppler. Preliminary doppler results are negative. I called Mrs. Gamero, she is willing to transition to Xarelto. Xarelto samples given:  15 mg #14 Dr. Burr Medico states she will call the patient and take over her anticoagulation.  Theone Murdoch, PharmD

## 2015-02-07 NOTE — Progress Notes (Signed)
*  PRELIMINARY RESULTS* Vascular Ultrasound Lower extremity venous duplex has been completed.  Preliminary findings: No evidence of DVT.   Attempted call report to Dr. Burr Medico. Left voice message with results.   Landry Mellow, RDMS, RVT  02/07/2015, 10:14 AM

## 2015-02-10 ENCOUNTER — Telehealth: Payer: Self-pay | Admitting: Hematology

## 2015-02-10 NOTE — Telephone Encounter (Signed)
per [facility] to sch pt appt-cld & left pt a message to adv of appt time & date

## 2015-02-13 MED ORDER — RIVAROXABAN 20 MG PO TABS: 20.0000 mg | ORAL_TABLET | Freq: Every day | ORAL | Status: DC

## 2015-02-17 ENCOUNTER — Ambulatory Visit (INDEPENDENT_AMBULATORY_CARE_PROVIDER_SITE_OTHER): Payer: 59 | Admitting: Gynecology

## 2015-02-17 ENCOUNTER — Ambulatory Visit (INDEPENDENT_AMBULATORY_CARE_PROVIDER_SITE_OTHER): Payer: 59

## 2015-02-17 ENCOUNTER — Other Ambulatory Visit (HOSPITAL_BASED_OUTPATIENT_CLINIC_OR_DEPARTMENT_OTHER): Payer: 59

## 2015-02-17 ENCOUNTER — Encounter: Payer: Self-pay | Admitting: Hematology

## 2015-02-17 ENCOUNTER — Telehealth: Payer: Self-pay | Admitting: Hematology

## 2015-02-17 ENCOUNTER — Ambulatory Visit (HOSPITAL_BASED_OUTPATIENT_CLINIC_OR_DEPARTMENT_OTHER): Payer: 59 | Admitting: Hematology

## 2015-02-17 ENCOUNTER — Other Ambulatory Visit: Payer: Self-pay | Admitting: Gynecology

## 2015-02-17 ENCOUNTER — Encounter: Payer: Self-pay | Admitting: Gynecology

## 2015-02-17 VITALS — BP 124/80

## 2015-02-17 VITALS — BP 115/65 | HR 80 | Temp 99.0°F | Resp 16 | Ht 63.0 in | Wt 164.9 lb

## 2015-02-17 DIAGNOSIS — R188 Other ascites: Secondary | ICD-10-CM | POA: Diagnosis not present

## 2015-02-17 DIAGNOSIS — Z7901 Long term (current) use of anticoagulants: Secondary | ICD-10-CM

## 2015-02-17 DIAGNOSIS — R102 Pelvic and perineal pain: Secondary | ICD-10-CM | POA: Diagnosis not present

## 2015-02-17 DIAGNOSIS — N831 Corpus luteum cyst of ovary, unspecified side: Secondary | ICD-10-CM

## 2015-02-17 DIAGNOSIS — N8320 Unspecified ovarian cysts: Secondary | ICD-10-CM

## 2015-02-17 DIAGNOSIS — N83202 Unspecified ovarian cyst, left side: Secondary | ICD-10-CM | POA: Insufficient documentation

## 2015-02-17 DIAGNOSIS — R079 Chest pain, unspecified: Secondary | ICD-10-CM

## 2015-02-17 DIAGNOSIS — I2699 Other pulmonary embolism without acute cor pulmonale: Secondary | ICD-10-CM | POA: Diagnosis not present

## 2015-02-17 LAB — CBC WITH DIFFERENTIAL/PLATELET
BASO%: 0.2 % (ref 0.0–2.0)
Basophils Absolute: 0 10*3/uL (ref 0.0–0.1)
EOS%: 1.5 % (ref 0.0–7.0)
Eosinophils Absolute: 0.1 10*3/uL (ref 0.0–0.5)
HCT: 37.2 % (ref 34.8–46.6)
HGB: 12.5 g/dL (ref 11.6–15.9)
LYMPH#: 2.1 10*3/uL (ref 0.9–3.3)
LYMPH%: 33.7 % (ref 14.0–49.7)
MCH: 29.6 pg (ref 25.1–34.0)
MCHC: 33.6 g/dL (ref 31.5–36.0)
MCV: 87.9 fL (ref 79.5–101.0)
MONO#: 0.6 10*3/uL (ref 0.1–0.9)
MONO%: 9.6 % (ref 0.0–14.0)
NEUT#: 3.4 10*3/uL (ref 1.5–6.5)
NEUT%: 55 % (ref 38.4–76.8)
Platelets: 271 10*3/uL (ref 145–400)
RBC: 4.23 10*6/uL (ref 3.70–5.45)
RDW: 14.1 % (ref 11.2–14.5)
WBC: 6.2 10*3/uL (ref 3.9–10.3)

## 2015-02-17 LAB — PROTIME-INR
INR: 1.5 — AB (ref 2.00–3.50)
Protime: 18 Seconds — ABNORMAL HIGH (ref 10.6–13.4)

## 2015-02-17 LAB — COMPREHENSIVE METABOLIC PANEL (CC13)
ALBUMIN: 3.9 g/dL (ref 3.5–5.0)
ALK PHOS: 55 U/L (ref 40–150)
ALT: 9 U/L (ref 0–55)
ANION GAP: 8 meq/L (ref 3–11)
AST: 14 U/L (ref 5–34)
BUN: 9.5 mg/dL (ref 7.0–26.0)
CO2: 22 mEq/L (ref 22–29)
Calcium: 9.3 mg/dL (ref 8.4–10.4)
Chloride: 111 mEq/L — ABNORMAL HIGH (ref 98–109)
Creatinine: 0.8 mg/dL (ref 0.6–1.1)
Glucose: 113 mg/dl (ref 70–140)
Potassium: 3.6 mEq/L (ref 3.5–5.1)
Sodium: 141 mEq/L (ref 136–145)
Total Bilirubin: 0.56 mg/dL (ref 0.20–1.20)
Total Protein: 6.8 g/dL (ref 6.4–8.3)

## 2015-02-17 MED ORDER — RIVAROXABAN 20 MG PO TABS
20.0000 mg | ORAL_TABLET | Freq: Every day | ORAL | Status: DC
Start: 2015-02-17 — End: 2015-04-22

## 2015-02-17 NOTE — Telephone Encounter (Signed)
Gave adn printed appt sched and avs for pt for OCT °

## 2015-02-17 NOTE — Progress Notes (Signed)
[place]  [phone] [phone]  Clinic New Consult Note   Patient Care Team: No Pcp Per Patient as PCP - General (General Practice) 02/17/2015  CHIEF COMPLAINTS/PURPOSE OF CONSULTATION:  Pulmonary embolism  HISTORY OF PRESENTING ILLNESS:  Leslie Owen 34 y.o. female is here because of recently diagnosed pulmonary embolism.  She was at her usual health status continue 09/10/2014 when she developed left chest pain, dyspnea and left arm pain. She went to ED and was found to have PE. Doppler of the low extremity was negative for DVT. She was started on lovenox 70mg  q12h for 7 days, and she was transitioned to coumadin. She has been following up with her primary care physician Dr. Thompson Caul office for Coumadin dose adjustment. Her INR was supratherapeutic last week, Coumadin dose was decreased and her INR was 1.7 yesterday.  She developed left-sided chest pain again on 09/27/2014. She went to ED again and a CT of the chest angiogram was negative for PE. The chest pain resolved spontaneously 2 days later. She however noticed pain in the left arm and left leg from yesterday, with mild edema in both extremities, and limited range of motion of the left shoulder. She denies any fever, no injury or fall.  She otherwise is doing well. She works full-time, quite active, takes care of 3 young children at home.  INTERIM HISTORY  Marine returns for follow-up.  She has been following up with our Coumadin clinic , and her INR  Has been labile and frequently under therapeutic  Range despite  Large dose of Coumadin. I saw her when she was in the Coumadin clinic last week , she complained left leg swollen and pain , we  Obtained a Doppler of lower extremities which was negative.  She agreed finally to switch to Xarelto, and we given her 15 mg sample last week. She has been tolerating very well , no noticeable side effects, no bleeding episodes.  MEDICAL HISTORY:  Past  Medical History  Diagnosis Date  . Ulcer   . Encounter for diagnostic endoscopy   . Uterine fibroids affecting pregnancy   . Gall bladder stones   . Anemia   . Pulmonary embolus     SURGICAL HISTORY: Past Surgical History  Procedure Laterality Date  . Cesarean section    . Myomectomy    . Laporoscopy    . Ovarian cysts    . Wisdom tooth extraction    . Cholecystectomy    . Abdominal hysterectomy      SOCIAL HISTORY: History   Social History  . Marital Status: Married    Spouse Name: N/A  . Number of Children: 3, age of 67, 3, 2   . Years of Education: N/A   Occupational History  . Not on file.   Social History Main Topics  . Smoking status: Former Smoker -- 0.25 packs/day for .5 years    Types: Cigarettes    Quit date: 10/09/2001  . Smokeless tobacco: Never Used  . Alcohol Use: No  . Drug Use: No  . Sexual Activity: Yes   Other Topics Concern  . Not on file   Social History Narrative    FAMILY HISTORY: Family History  Problem Relation Age of Onset  . Arthritis Mother   . Hypertension Mother   . Heart disease Father   . Cancer Maternal Aunt     leukemia   . Cancer Maternal Grandfather 60    lung cancer   . Cancer Maternal  Aunt     breast ca  . Cancer Maternal Aunt     lung ca    ALLERGIES:  has No Known Allergies.  MEDICATIONS:  Current Outpatient Prescriptions  Medication Sig Dispense Refill  . rivaroxaban (XARELTO) 20 MG TABS tablet Take 1 tablet (20 mg total) by mouth daily with supper. 30 tablet 3  . Acetaminophen 500 MG coapsule Take 1 capsule by mouth every 6 (six) hours as needed for pain.    . rivaroxaban (XARELTO) 20 MG TABS tablet Take 1 tablet (20 mg total) by mouth daily with supper. 30 tablet 3   No current facility-administered medications for this visit.    REVIEW OF SYSTEMS:   Constitutional: Denies fevers, chills or abnormal night sweats Eyes: Denies blurriness of vision, double vision or watery eyes Ears, nose, mouth,  throat, and face: Denies mucositis or sore throat Respiratory: Denies cough, dyspnea or wheezes Cardiovascular: Denies palpitation, chest discomfort or lower extremity swelling Gastrointestinal:  Denies nausea, heartburn or change in bowel habits Skin: Denies abnormal skin rashes Lymphatics: Denies new lymphadenopathy or easy bruising Neurological:Denies numbness, tingling or new weaknesses Behavioral/Psych: Mood is stable, no new changes  Ext: (+) Pain and swelling in left arm and left leg  All other systems were reviewed with the patient and are negative.  PHYSICAL EXAMINATION: ECOG PERFORMANCE STATUS: 0 - Asymptomatic  Filed Vitals:   02/17/15 1330  BP: 115/65  Pulse: 80  Temp: 99 F (37.2 C)  Resp: 16   Filed Weights   02/17/15 1330  Weight: 164 lb 14.4 oz (74.798 kg)    GENERAL:alert, no distress and comfortable SKIN: skin color, texture, turgor are normal, no rashes or significant lesions EYES: normal, conjunctiva are pink and non-injected, sclera clear OROPHARYNX:no exudate, no erythema and lips, buccal mucosa, and tongue normal  NECK: supple, thyroid normal size, non-tender, without nodularity LYMPH:  no palpable lymphadenopathy in the cervical, axillary or inguinal LUNGS: clear to auscultation and percussion with normal breathing effort HEART: regular rate & rhythm and no murmurs and no lower extremity edema ABDOMEN:abdomen soft, non-tender and normal bowel sounds Musculoskeletal:no cyanosis of digits and no clubbing, mild nonpitting edema on left upper extremity and left lower extremity. Tenderness of the left arm and left calf. PSYCH: alert & oriented x 3 with fluent speech NEURO: no focal motor/sensory deficits  LABORATORY DATA:  CBC Latest Ref Rng 02/17/2015 01/22/2015 12/26/2014  WBC 3.9 - 10.3 10e3/uL 6.2 5.9 4.0  Hemoglobin 11.6 - 15.9 g/dL 12.5 12.7 12.8  Hematocrit 34.8 - 46.6 % 37.2 38.2 38.6  Platelets 145 - 400 10e3/uL 271 312 267    CMP Latest Ref Rng  02/17/2015 01/22/2015 12/26/2014  Glucose 70 - 140 mg/dl 113 86 130  BUN 7.0 - 26.0 mg/dL 9.5 10 7.9  Creatinine 0.6 - 1.1 mg/dL 0.8 0.86 0.9  Sodium 136 - 145 mEq/L 141 139 139  Potassium 3.5 - 5.1 mEq/L 3.6 3.8 3.8  Chloride 96 - 112 mEq/L - 106 -  CO2 22 - 29 mEq/L 22 22 23   Calcium 8.4 - 10.4 mg/dL 9.3 9.4 8.9  Total Protein 6.4 - 8.3 g/dL 6.8 7.1 6.9  Total Bilirubin 0.20 - 1.20 mg/dL 0.56 0.8 0.26  Alkaline Phos 40 - 150 U/L 55 57 57  AST 5 - 34 U/L 14 15 18   ALT 0 - 55 U/L 9 10 12      RADIOGRAPHIC STUDIES: I have personally reviewed the radiological images as listed and agreed with the findings in  the report. US Breast Ltd Uni Left Inc Axilla  01/28/2015   CLINICAL DATA:  Diffuse bilateral intermittent breast pain. Self palpated left breast area of palpable concern present for approximately 1 month.  EXAM: DIGITAL DIAGNOSTIC BILATERAL MAMMOGRAM WITH 3D TOMOSYNTHESIS WITH CAD  ULTRASOUND LEFT BREAST  COMPARISON:  None.  ACR Breast Density Category c: The breast tissue is heterogeneously dense, which may obscure small masses.  FINDINGS: There are no suspicious masses, areas of architectural distortion or microcalcifications in either breast. The area of palpable concern and pain in the left breast slightly upper outer quadrant, middle depth, is marked with a BB.  Mammographic images were processed with CAD.  On physical exam, no areas of palpable concern are identified.  Targeted ultrasound is performed, showing no suspicious lesions.  IMPRESSION: No mammographic or sonographic evidence of malignancy in either breast.  RECOMMENDATION: Screening mammogram at age 18 unless there are persistent or intervening clinical concerns. (Code:SM-B-40A)  I have discussed the findings and recommendations with the patient. Results were also provided in writing at the conclusion of the visit. If applicable, a reminder letter will be sent to the patient regarding the next appointment.  BI-RADS CATEGORY  1:  Negative.   Electronically Signed   By: Fidela Salisbury M.D.   On: 01/28/2015 17:13   Mm Diag Breast Tomo Bilateral  01/28/2015   CLINICAL DATA:  Diffuse bilateral intermittent breast pain. Self palpated left breast area of palpable concern present for approximately 1 month.  EXAM: DIGITAL DIAGNOSTIC BILATERAL MAMMOGRAM WITH 3D TOMOSYNTHESIS WITH CAD  ULTRASOUND LEFT BREAST  COMPARISON:  None.  ACR Breast Density Category c: The breast tissue is heterogeneously dense, which may obscure small masses.  FINDINGS: There are no suspicious masses, areas of architectural distortion or microcalcifications in either breast. The area of palpable concern and pain in the left breast slightly upper outer quadrant, middle depth, is marked with a BB.  Mammographic images were processed with CAD.  On physical exam, no areas of palpable concern are identified.  Targeted ultrasound is performed, showing no suspicious lesions.  IMPRESSION: No mammographic or sonographic evidence of malignancy in either breast.  RECOMMENDATION: Screening mammogram at age 62 unless there are persistent or intervening clinical concerns. (Code:SM-B-40A)  I have discussed the findings and recommendations with the patient. Results were also provided in writing at the conclusion of the visit. If applicable, a reminder letter will be sent to the patient regarding the next appointment.  BI-RADS CATEGORY  1: Negative.   Electronically Signed   By: Fidela Salisbury M.D.   On: 01/28/2015 17:13    ASSESSMENT AND PLAN: 34 year old female, without significant past medical history, presented was unprovoked PE. She does not smoke, not on oral contraceptives, no family history of thrombosis  . Unprovoked right lower lobe pulmonary embolism -I recommend anticoagulation for total of 6 months. -Hypercoagulation lab work one month after she stops anticoagulation - due to her  Labile INR,  We switched her from Coumadin to Xarelto - I sent a prescription  of Xarelto 20 mg once daily to her pharmacy today -RTC in 3 month. Will stop a/c in 3 month   2. Recurrent left low chest/upper abdominal pain -This is unlikely related to her PE, which was in the right lower lobe. 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 We'll continue follow   follow-up: Return to clinic in  3 months.  If no episodes of recurrent PE or DVT, OK to stop  Xarelto after next visit  All questions were answered. The patient knows to call the clinic with any problems, questions or concerns. I spent 20 minutes counseling the patient face to face. The total time spent in the appointment was 25 minutes and more than 50% was on counseling.     Truitt Merle, MD 02/17/2015 2:16 PM

## 2015-02-17 NOTE — Progress Notes (Signed)
   Patient is a 34 year old who was seen in the office on June 29 for her annual exam  N during the exam she was found to be tender on her right lower abdomen so she is here today discuss her ultrasound. Also at time of the exam a left breast nodule was noted and patient had a 3-D mammogram and ultrasound and the report was negative. She's having minimal symptoms today.    her history as follows:  Myomectomy Laparoscopy 2 for endometriosis  The above surgeries were done in [facility]  Patient had endometrial ablation in [place] Robotic hysterectomy for fibroid uterus and endometriosis in [place] 2015  Patient early this year had presented to the emergency room with shortness of breath and rib pains it turned out she had a pulmonary embolism and she's  She had been on Coumadin and has informed me that because of recent events she was switched over to Lovenox  . Ultrasound today: Absent uterus. Right ovary normal. Arterial blood flow was seen. Left ovary with thick wall cyst measuring 2.6 x 1.2 x 1.4 cm internal low level echoes positive color flow the periphery consistent with corpus luteum cyst. Fluid was seen in the cul-de-sac 28 x 26 mm and fluid surrounding the left ovary 25 x 21 mm. Arterial blood flow was seen in the left ovary.   Assessment/plan: Patient with physiological corpus luteum cyst contributing to the discomfort the patient appears to few days ago with her exam. No further intervention needed at this time. Patient currently being followed by her primary physician because of pulmonary embolism for which she recently switched over to Lovenox. They're doing also her blood work.  She was reminded to continue to do her monthly breast exam. We will see her back in one year or when necessary.

## 2015-02-25 ENCOUNTER — Emergency Department (HOSPITAL_COMMUNITY)
Admission: EM | Admit: 2015-02-25 | Discharge: 2015-02-25 | Disposition: A | Discharge status: Home / Self Care | Payer: 59 | Attending: Physician Assistant | Admitting: Physician Assistant

## 2015-02-25 ENCOUNTER — Encounter (HOSPITAL_COMMUNITY): Payer: Self-pay

## 2015-02-25 ENCOUNTER — Emergency Department (HOSPITAL_COMMUNITY): Payer: 59

## 2015-02-25 DIAGNOSIS — Z7901 Long term (current) use of anticoagulants: Secondary | ICD-10-CM | POA: Insufficient documentation

## 2015-02-25 DIAGNOSIS — Z86711 Personal history of pulmonary embolism: Secondary | ICD-10-CM | POA: Insufficient documentation

## 2015-02-25 DIAGNOSIS — Z862 Personal history of diseases of the blood and blood-forming organs and certain disorders involving the immune mechanism: Secondary | ICD-10-CM | POA: Diagnosis not present

## 2015-02-25 DIAGNOSIS — Z87891 Personal history of nicotine dependence: Secondary | ICD-10-CM | POA: Diagnosis not present

## 2015-02-25 DIAGNOSIS — R110 Nausea: Secondary | ICD-10-CM | POA: Diagnosis not present

## 2015-02-25 DIAGNOSIS — Z8742 Personal history of other diseases of the female genital tract: Secondary | ICD-10-CM | POA: Diagnosis not present

## 2015-02-25 DIAGNOSIS — R1012 Left upper quadrant pain: Secondary | ICD-10-CM

## 2015-02-25 LAB — URINALYSIS, ROUTINE W REFLEX MICROSCOPIC
Bilirubin Urine: NEGATIVE
GLUCOSE, UA: NEGATIVE mg/dL
KETONES UR: NEGATIVE mg/dL
Leukocytes, UA: NEGATIVE
NITRITE: NEGATIVE
Protein, ur: NEGATIVE mg/dL
Specific Gravity, Urine: 1.022 (ref 1.005–1.030)
Urobilinogen, UA: 1 mg/dL (ref 0.0–1.0)
pH: 6 (ref 5.0–8.0)

## 2015-02-25 LAB — COMPREHENSIVE METABOLIC PANEL
ALK PHOS: 51 U/L (ref 38–126)
ALT: 11 U/L — AB (ref 14–54)
AST: 19 U/L (ref 15–41)
Albumin: 4.2 g/dL (ref 3.5–5.0)
Anion gap: 9 (ref 5–15)
BUN: 9 mg/dL (ref 6–20)
CALCIUM: 9.7 mg/dL (ref 8.9–10.3)
CO2: 23 mmol/L (ref 22–32)
CREATININE: 0.94 mg/dL (ref 0.44–1.00)
Chloride: 107 mmol/L (ref 101–111)
GFR calc Af Amer: >60 mL/min (ref >60)
GLUCOSE: 93 mg/dL (ref 65–99)
Potassium: 3.9 mmol/L (ref 3.5–5.1)
SODIUM: 139 mmol/L (ref 135–145)
Total Bilirubin: 0.5 mg/dL (ref 0.3–1.2)
Total Protein: 7.7 g/dL (ref 6.5–8.1)

## 2015-02-25 LAB — CBC
HCT: 41.4 % (ref 36.0–46.0)
Hemoglobin: 13.2 g/dL (ref 12.0–15.0)
MCH: 28.3 pg (ref 26.0–34.0)
MCHC: 31.9 g/dL (ref 30.0–36.0)
MCV: 88.8 fL (ref 78.0–100.0)
Platelets: 312 10*3/uL (ref 150–400)
RBC: 4.66 MIL/uL (ref 3.87–5.11)
RDW: 13.9 % (ref 11.5–15.5)
WBC: 3.8 10*3/uL — ABNORMAL LOW (ref 4.0–10.5)

## 2015-02-25 LAB — URINE MICROSCOPIC-ADD ON

## 2015-02-25 LAB — LIPASE, BLOOD: Lipase: 26 U/L (ref 22–51)

## 2015-02-25 MED ORDER — IOHEXOL 300 MG/ML  SOLN
50.0000 mL | Freq: Once | INTRAMUSCULAR | Status: AC | PRN
  Administered 2015-02-25: 50 mL via ORAL

## 2015-02-25 MED ORDER — KETOROLAC TROMETHAMINE 30 MG/ML IJ SOLN
30.0000 mg | Freq: Once | INTRAMUSCULAR | Status: AC
  Administered 2015-02-25: 30 mg via INTRAVENOUS
  Filled 2015-02-25: qty 1

## 2015-02-25 MED ORDER — IOHEXOL 300 MG/ML  SOLN
100.0000 mL | Freq: Once | INTRAMUSCULAR | Status: AC | PRN
  Administered 2015-02-25: 100 mL via INTRAVENOUS

## 2015-02-25 NOTE — ED Notes (Addendum)
Pt c/o abdominal pain that began last Friday.  She describes it more on the left side of her abdomin and her umbilical area.  The pain is sharp and is constant.  No appetite.  Denies nausea and vomiting.  She did say she has had about 6 loose stools.  Per patient, no blood was seen.  She has not been around anyone that has been sick.  Pt has had a hysterectomy.  Denies fever.  She has difficulty sleeping due to pain.

## 2015-02-25 NOTE — Discharge Instructions (Signed)
Unsure what is causing her abdominal plain. Please follow-up with your Abdominal Pain Many things can cause abdominal pain. Usually, abdominal pain is not caused by a disease and will improve without treatment. It can often be observed and treated at home. Your health care provider will do a physical exam and possibly order blood tests and X-rays to help determine the seriousness of your pain. However, in many cases, more time must pass before a clear cause of the pain can be found. Before that point, your health care provider may not know if you need more testing or further treatment. HOME CARE INSTRUCTIONS  Monitor your abdominal pain for any changes. The following actions may help to alleviate any discomfort you are experiencing:  Only take over-the-counter or prescription medicines as directed by your health care provider.  Do not take laxatives unless directed to do so by your health care provider.  Try a clear liquid diet (broth, tea, or water) as directed by your health care provider. Slowly move to a bland diet as tolerated. SEEK MEDICAL CARE IF:  You have unexplained abdominal pain.  You have abdominal pain associated with nausea or diarrhea.  You have pain when you urinate or have a bowel movement.  You experience abdominal pain that wakes you in the night.  You have abdominal pain that is worsened or improved by eating food.  You have abdominal pain that is worsened with eating fatty foods.  You have a fever. SEEK IMMEDIATE MEDICAL CARE IF:   Your pain does not go away within 2 hours.  You keep throwing up (vomiting).  Your pain is felt only in portions of the abdomen, such as the right side or the left lower portion of the abdomen.  You pass bloody or black tarry stools. MAKE SURE YOU:  Understand these instructions.   Will watch your condition.   Will get help right away if you are not doing well or get worse.  Document Released: 04/21/2005 Document Revised:  07/17/2013 Document Reviewed: 03/21/2013 [place] Patient Information 2015 [place], [place]. This information is not intended to replace advice given to you by your health care provider. Make sure you discuss any questions you have with your health care provider.  primary care provider.   Emergency Department Resource Guide 1) Find a Doctor and Pay Out of Pocket Although you won't have to find out who is covered by your insurance plan, it is a good idea to ask around and get recommendations. You will then need to call the office and see if the doctor you have chosen will accept you as a new patient and what types of options they offer for patients who are self-pay. Some doctors offer discounts or will set up payment plans for their patients who do not have insurance, but you will need to ask so you aren't surprised when you get to your appointment.  2) Contact Your Local Health Department Not all health departments have doctors that can see patients for sick visits, but many do, so it is worth a call to see if yours does. If you don't know where your local health department is, you can check in your phone book. The CDC also has a tool to help you locate your state's health department, and many state websites also have listings of all of their local health departments.  3) Find a [facility] If your illness is not likely to be very severe or complicated, you may want to try a walk in clinic.  These are popping up all over the country in pharmacies, drugstores, and shopping centers. They're usually staffed by nurse practitioners or physician assistants that have been trained to treat common illnesses and complaints. They're usually fairly quick and inexpensive. However, if you have serious medical issues or chronic medical problems, these are probably not your best option.  No Primary Care Doctor: - Call Health Connect at  [phone] - they can help you locate a primary care doctor that  accepts your  insurance, provides certain services, etc. - Physician Referral Service- [phone]  Chronic Pain Problems: Organization         Address  Phone   Notes  [facility]  [phone] Patients need to be referred by their primary care doctor.   Medication Assistance: Organization         Address  Phone   Notes  [facility] Medication [facility] [address] [place], [place] [address] [phone] --Must be a resident of [facility] -- Must have NO insurance coverage whatsoever (no Medicaid/ Medicare, etc.) -- The pt. MUST have a primary care doctor that directs their care regularly and follows them in the community   MedAssist  [phone]   [employer]  [phone]    Agencies that provide inexpensive medical care: Organization         Address  Phone   Notes  [facility]  [phone]   [facility]    [phone]   [facility] [place], [place] [address] [phone]   [facility] [address], [place] [phone]   Planned Parenthood    [phone]   [facility]    [phone]   [place] and [address], [place] Phone:  [phone], Fax:  [phone] Hours of Operation:  9 am - 6 pm, M-F.  Also accepts Medicaid/Medicare and self-pay.  [facility] for [place], [address], [place] Phone: [phone], Fax: [phone]. Hours of Operation:  8:30 am - 5:30 pm, M-F.  Also accepts Medicaid and self-pay.  [facility] [place] [address] [place] Phone: [phone]   [place], [place], [place] [phone], Ext. [phone] Mondays & Thursdays: 7-9 AM.  First 15 patients are seen on a first come, first serve basis.    [place] Providers:  Organization          Address  Phone   Notes  [facility] [address], [place] [phone] Also accepts self-pay patients.  [facility] [address], [place]  [phone]   [place], [address], [place] [phone]   [facility] [address], [place] [phone]   Lucianne Lei [address], [place]   [phone] Only accepts [place] Access [place] patients after they have their name applied to their card.   Self-Pay (no insurance) in [facility]:  Organization         Address  Phone   Notes  Sickle Cell Patients, [facility] Internal Medicine [place] [phone]   [facility]gent Care [place] [phone]   [facility]  [place]  [address], [place] [phone]   [facility]/Dr. Osei-Bonsu  [address], [place] or [address], [place] [phone] Phone number for both [place] and [place] locations is the same.  [facility] [address], [place] [phone]   [facility] [address], [place] or [address] [phone]   [facility] [address], [place] [phone], phone; [phone], fax Sees patients 1st and 3rd Saturday of every month.  Must not qualify for public or private insurance (i.e. Medicaid, Medicare, [place] Health Choice, Veterans' Benefits)  Household income should be no more than 200% of the poverty level The clinic cannot treat you if you are pregnant or think you are pregnant  Sexually transmitted diseases are not treated at the clinic.    Dental Care: Organization         Address  Phone  Notes  [facility] Department of [facility] [place] [phone] Accepts children up to age 40 who are enrolled in [place] or [place]; pregnant women with a Medicaid card; and children who have applied for Medicaid or [place] Health Choice, but were declined, whose parents can pay a reduced fee at time of service.  [facility] Department of [facility]  [address], [place] [phone] Accepts children up to age 34 who are enrolled in [place] or [place]; pregnant women with a Medicaid card; and children who have applied for Medicaid or  Health Choice, but were declined, whose parents can pay a reduced fee at time of service.  [place] Adult Dental Access PROGRAM  [place] [phone] Patients are seen by appointment only. Walk-ins are not accepted. [place] will see patients 101 years of age and older. Monday - Tuesday (8am-5pm) Most Wednesdays (8:30-5pm) $30 per visit, cash only  [facility] Adult Dental Access PROGRAM  [address], [facility] [phone] Patients are seen by appointment only. Walk-ins are not accepted. [place] will see patients 54 years of age and older. One Wednesday Evening (Monthly: Volunteer Based).  $30 per visit, cash only  [place]  [phone] for adults; Children under age 93, call Graduate Pediatric Dentistry at [phone]. Children aged 11-14, please call [phone] to request a pediatric application.  Dental services are provided in all areas of dental care including fillings, crowns and bridges, complete and partial dentures, implants, gum treatment, root canals, and extractions. Preventive care is also provided. Treatment is provided to both adults and children. Patients are selected via a lottery and there is often a waiting list.   [facility] [address], [place]  [phone] [email or web address]   [facility] [address] [place], [place]  [phone], Ext. [phone] Second and Fourth Thursday of each month, opens at 6:30 AM; Clinic ends at 9 AM.  Patients are seen on a first-come first-served basis, and a limited number are seen during each clinic.   [facility]  [address] Hillard Danker [place], [place] [phone]   Eligibility Requirements You must have lived in [place], [place], or [place] counties for at least the last three months.   You cannot be eligible for state or federal sponsored [employer], including [employer]  Administration, Medicaid, or Medicare.   You generally cannot be eligible for healthcare insurance through your employer.    How to apply: Eligibility screenings are held every Tuesday and Wednesday afternoon from 1:00 pm until 4:00 pm. You do not need an appointment for the interview!  [facility] [address], [place], [place]   [place]  [place] Department  [place]  [phone]    Behavioral Health Resources in the Community: Intensive Outpatient Programs Organization         Address  Phone  Notes  [place]. [address], [place], [place] [phone]   [facility] Outpatient [address], [place], [place]   ADS: Alcohol & Drug S[facility] [address] [place], [place]   [place] [address]  [place], [place] or [phone]   Substance Abuse Resources Organization         Address  Phone  Notes  Alcohol and Drug Services  [phone]   [place]  [phone]   [facility] [place]   Chinita Pester  [phone]   Residential & Outpatient Substance Abuse Program  [phone]   Psychological Services Organization         Address  Phone  Notes  [facility]  [place]  [phone]    [place] [address] [place] or [phone]    Mobile Crisis Teams Organization         Address  Phone  Notes  [facility], Mobile Crisis Care Unit  [phone]   [facility]  [address] [place], [place]   [place] Levels [address] [place] [phone]    Self-Help/Support Groups Organization         Address  Phone             Notes  [place]. of [place] - variety of support groups  [place] Call for more information  Narcotics Anonymous (NA), [facility] [address], [employer] [place]  2 meetings at this location   Special educational needs teacher         Address  Phone  Notes  ASAP Residential Treatment [place],    [place]  [phone]   [facility]  [address] [place] [address]858, [place], [place]   [place], [place] [phone] Admissions: 8am-3pm M-F  Incentives Substance [place] [address],    [place], [place] [phone]   [facility] [address] [facility], [place], [place]   [facility] [address]  [place], [place]   [facility] Programs - Intensive Outpatient [address], [place], [place]   [facility] ([place].) [place].,  [place], [place] [phone] or [phone]   [facility] Services (RTS) [address] [place], [place] Accepts Medicaid  [facility] [place] [address],  [place] [phone] Substance Abuse/Addiction Treatment   [facility] Organization         Address  Phone  Notes  [facility]  [phone]   Domenic Schwab, PhD [address] [place], [place]   [phone] or [phone]  [facility]   [address] [place], [place] [phone]   [address], [place], [place] [phone] Insurance/Medicaid/sponsorship through [facility] and Families [address], Ste [place], [place] [phone] [place], [place] [phone]    Dr. Adele Schilder  [phone]   [facility] Dept. 1) [address], [place] 2) [place] 3)  [address], [place] [phone]  [phone]   [place] [phone] or [phone] (After Hours)

## 2015-02-25 NOTE — ED Notes (Signed)
Per pt, abdominal pain since Friday.  Getting worse.  No n/v/d.  Abdomen hurts when urinating or coughing.

## 2015-02-25 NOTE — ED Notes (Signed)
Pt tolerates PO challenge well, denies new abdominal pain, nausea, nor episode of emesis. Requesting to be discharged.

## 2015-02-25 NOTE — ED Notes (Signed)
Pt offered 240 mL of ginger ale and she requests a [place] sandwich, I plan to reassess tolerance for the PO challenge.

## 2015-02-25 NOTE — ED Provider Notes (Signed)
CSN: [id number]     Arrival date & time 02/25/15  1036 History   First MD Initiated Contact with Patient 02/25/15 1233     Chief Complaint  Patient presents with  . Abdominal Pain     (Consider location/radiation/quality/duration/timing/severity/associated sxs/prior Treatment) Patient is a 34 y.o. female presenting with abdominal pain.  Abdominal Pain Associated symptoms: nausea   Associated symptoms: no chest pain, no cough, no dysuria and no fatigue     Patient is 34 year old female presenting with left upper quadrant pain. 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 She reports that her pain is getting worse. She's also been easily bleeding. She is on Xarelto for bilateral PEs. No new shortness of breath or leg swelling this time. Patient states she's been unable to eat very well for the last 3 days. She has no gallbladder.   Past Medical History  Diagnosis Date  . Ulcer   . Encounter for diagnostic endoscopy   . Uterine fibroids affecting pregnancy   . Gall bladder stones   . Anemia   . Pulmonary embolus    Past Surgical History  Procedure Laterality Date  . Cesarean section    . Myomectomy    . Laporoscopy    . Ovarian cysts    . Wisdom tooth extraction    . Cholecystectomy    . Abdominal hysterectomy     Family History  Problem Relation Age of Onset  . Arthritis Mother   . Hypertension Mother   . Heart disease Father   . Cancer Maternal Aunt     leukemia   . Cancer Maternal Grandfather 60    lung cancer   . Cancer Maternal Aunt     breast ca  . Cancer Maternal Aunt     lung ca   History  Substance Use Topics  . Smoking status: Former Smoker -- 0.25 packs/day for .5 years    Types: Cigarettes    Quit date: 10/09/2001  . Smokeless tobacco: Never Used  . Alcohol Use: No   OB History    Gravida Para Term Preterm AB TAB SAB Ectopic Multiple Living   3 2 1 1      3       Review of Systems  Constitutional: Negative for activity change and fatigue.  HENT: Negative for congestion and drooling.   Eyes: Negative for discharge.  Respiratory: Negative for cough and chest tightness.   Cardiovascular: Negative for chest pain.  Gastrointestinal: Positive for nausea and abdominal pain. Negative for abdominal distention.  Genitourinary: Negative for dysuria and difficulty urinating.  Musculoskeletal: Negative for joint swelling.  Skin: Negative for rash.  Allergic/Immunologic: Negative for immunocompromised state.  Neurological: Negative for seizures and speech difficulty.  Psychiatric/Behavioral: Negative for behavioral problems and agitation.      Allergies  Review of patient's allergies indicates no known allergies.  Home Medications   Prior to Admission medications   Medication Sig Start Date End Date Taking? Authorizing Provider  Acetaminophen 500 MG coapsule Take 1 capsule by mouth every 6 (six) hours as needed for pain.   Yes Historical Provider, MD  Rivaroxaban (XARELTO) 15 MG TABS tablet Take 15 mg by mouth 2 (two) times daily.   Yes Historical Provider, MD  rivaroxaban (XARELTO) 20 MG TABS tablet Take 1 tablet (20 mg total) by mouth daily with supper. Patient taking differently: Take 20 mg by mouth  daily.  02/17/15  Yes Truitt Merle, MD  rivaroxaban (XARELTO) 20 MG TABS tablet Take 1 tablet (20 mg total) by mouth daily with supper. Patient not taking: Reported on 02/25/2015 02/13/15   Truitt Merle, MD   BP 104/59 mmHg  Pulse 68  Temp(Src) 97.7 F (36.5 C) (Oral)  Resp 17  Ht 5\' 3"  (1.6 m)  Wt 160 lb (72.576 kg)  BMI 28.35 kg/m2  SpO2 99% Physical Exam  Constitutional: She is oriented to person, place, and time. She appears well-developed and well-nourished.  HENT:  Head: Normocephalic and atraumatic.  Eyes: Conjunctivae are normal. Right eye exhibits no discharge.  Neck: Neck supple.  Cardiovascular: Normal rate, regular rhythm and normal heart  sounds.   No murmur heard. Pulmonary/Chest: Effort normal and breath sounds normal. She has no wheezes. She has no rales.  Abdominal: Soft. She exhibits no distension. There is tenderness.  LUQ pain   Musculoskeletal: Normal range of motion. She exhibits no edema.  Neurological: She is oriented to person, place, and time. No cranial nerve deficit.  Skin: Skin is warm and dry. No rash noted. She is not diaphoretic.  Psychiatric: She has a normal mood and affect. Her behavior is normal.  Nursing note and vitals reviewed.   ED Course  Procedures (including critical care time) Labs Review Labs Reviewed  COMPREHENSIVE METABOLIC PANEL - Abnormal; Notable for the following:    ALT 11 (*)    All other components within normal limits  CBC - Abnormal; Notable for the following:    WBC 3.8 (*)    All other components within normal limits  URINALYSIS, ROUTINE W REFLEX MICROSCOPIC (NOT AT [facility]) - Abnormal; Notable for the following:    Color, Urine AMBER (*)    APPearance CLOUDY (*)    Hgb urine dipstick SMALL (*)    All other components within normal limits  URINE MICROSCOPIC-ADD ON - Abnormal; Notable for the following:    Squamous Epithelial / LPF MANY (*)    All other components within normal limits  LIPASE, BLOOD    Imaging Review Ct Abdomen Pelvis W Contrast  02/25/2015   CLINICAL DATA:  Left upper quadrant pain, history of splenic mass  EXAM: CT ABDOMEN AND PELVIS WITH CONTRAST  TECHNIQUE: Multidetector CT imaging of the abdomen and pelvis was performed using the standard protocol following bolus administration of intravenous contrast.  CONTRAST:  137mL OMNIPAQUE IOHEXOL 300 MG/ML  SOLN  COMPARISON:  07/03/2014  FINDINGS: Lung bases are unremarkable. Sagittal images of the spine are unremarkable. The patient is status postcholecystectomy. Enhanced liver is unremarkable. Pancreas and adrenal glands are unremarkable. Again noted hypovascular splenic lesion in the anterior aspect of the  spleen measures 3.7 x 3.6 cm. This is stable in size in appearance from prior exam. No additional splenic lesions are noted.  There is no perisplenic fluid or hemorrhage. Abdominal aorta is unremarkable. No small bowel obstruction. No ascites or free air. No adenopathy. Normal appendix. No pericecal inflammation. The urinary bladder is unremarkable. The ovaries are unremarkable. The uterus is surgically absent. No distal colonic obstruction. No destructive bony lesions are noted within pelvis. Mild degenerative changes bilateral SI joints.  Stable nodular scar midline anterior abdominal wall axial image 50 just above the rectus muscle measures 1.3 cm.  IMPRESSION: 1. 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 2. Status postcholecystectomy. 3. No hydronephrosis or hydroureter. 4. No pericecal inflammation.  Normal appendix. 5. No small bowel obstruction. 6. Surgical absent uterus.  Electronically Signed   By: Lahoma Crocker M.D.   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 Patient unsure much more about it. She said it was found in January in [place]. She recently moved here. She said she had many having increasing pain in the left upper quadrant in addition to having increased bruising.  We will do a CT to make sure there is no active bleeding left upper quadrant no splenic infarct nor enlarging of the tumor that pressing on blood flow. If not we'll have  patient follow up with her primary care provider in the area.    Modelle Vollmer Julio Alm, MD 02/26/15 [phone]

## 2015-04-02 ENCOUNTER — Other Ambulatory Visit: Payer: 59

## 2015-04-09 ENCOUNTER — Ambulatory Visit: Payer: 59 | Admitting: Hematology

## 2015-04-11 ENCOUNTER — Telehealth: Payer: Self-pay | Admitting: Hematology

## 2015-04-11 NOTE — Telephone Encounter (Signed)
cld pt to adv of appt chge time & date-left message

## 2015-04-22 ENCOUNTER — Emergency Department (HOSPITAL_COMMUNITY)
Admission: EM | Admit: 2015-04-22 | Discharge: 2015-04-22 | Disposition: A | Discharge status: Home / Self Care | Payer: 59 | Attending: Emergency Medicine | Admitting: Emergency Medicine

## 2015-04-22 ENCOUNTER — Emergency Department (HOSPITAL_COMMUNITY): Payer: 59

## 2015-04-22 ENCOUNTER — Encounter (HOSPITAL_COMMUNITY): Payer: Self-pay | Admitting: *Deleted

## 2015-04-22 DIAGNOSIS — Z86711 Personal history of pulmonary embolism: Secondary | ICD-10-CM

## 2015-04-22 DIAGNOSIS — R0602 Shortness of breath: Secondary | ICD-10-CM | POA: Insufficient documentation

## 2015-04-22 DIAGNOSIS — Z87891 Personal history of nicotine dependence: Secondary | ICD-10-CM | POA: Diagnosis not present

## 2015-04-22 DIAGNOSIS — Z862 Personal history of diseases of the blood and blood-forming organs and certain disorders involving the immune mechanism: Secondary | ICD-10-CM | POA: Insufficient documentation

## 2015-04-22 DIAGNOSIS — Z7901 Long term (current) use of anticoagulants: Secondary | ICD-10-CM | POA: Diagnosis not present

## 2015-04-22 DIAGNOSIS — R079 Chest pain, unspecified: Secondary | ICD-10-CM | POA: Insufficient documentation

## 2015-04-22 LAB — BASIC METABOLIC PANEL
Anion gap: 9 (ref 5–15)
BUN: 10 mg/dL (ref 6–20)
CO2: 23 mmol/L (ref 22–32)
Calcium: 9.1 mg/dL (ref 8.9–10.3)
Chloride: 103 mmol/L (ref 101–111)
Creatinine, Ser: 0.97 mg/dL (ref 0.44–1.00)
GFR calc Af Amer: >60 mL/min (ref >60)
GFR calc non Af Amer: >60 mL/min (ref >60)
Glucose, Bld: 92 mg/dL (ref 65–99)
Potassium: 3.7 mmol/L (ref 3.5–5.1)
Sodium: 135 mmol/L (ref 135–145)

## 2015-04-22 LAB — CBC
HCT: 39.3 % (ref 36.0–46.0)
Hemoglobin: 12.9 g/dL (ref 12.0–15.0)
MCH: 29.3 pg (ref 26.0–34.0)
MCHC: 32.8 g/dL (ref 30.0–36.0)
MCV: 89.3 fL (ref 78.0–100.0)
Platelets: 296 10*3/uL (ref 150–400)
RBC: 4.4 MIL/uL (ref 3.87–5.11)
RDW: 13.7 % (ref 11.5–15.5)
WBC: 5.3 10*3/uL (ref 4.0–10.5)

## 2015-04-22 LAB — D-DIMER, QUANTITATIVE (NOT AT ARMC)

## 2015-04-22 LAB — TROPONIN I: Troponin I: <0.03 ng/mL (ref <0.031)

## 2015-04-22 MED ORDER — MORPHINE SULFATE (PF) 4 MG/ML IV SOLN
4.0000 mg | Freq: Once | INTRAVENOUS | Status: AC
  Administered 2015-04-22: 4 mg via INTRAVENOUS
  Filled 2015-04-22: qty 1

## 2015-04-22 MED ORDER — SODIUM CHLORIDE 0.9 % IV BOLUS (SEPSIS)
1000.0000 mL | Freq: Once | INTRAVENOUS | Status: AC
  Administered 2015-04-22: 1000 mL via INTRAVENOUS

## 2015-04-22 MED ORDER — HYDROCODONE-ACETAMINOPHEN 5-325 MG PO TABS: ORAL_TABLET | ORAL | Status: DC

## 2015-04-22 NOTE — ED Provider Notes (Signed)
CSN: [id number]     Arrival date & time 04/22/15  1907 History   First MD Initiated Contact with Patient 04/22/15 2012     Chief Complaint  Patient presents with  . Chest Pain  . Shortness of Breath     (Consider location/radiation/quality/duration/timing/severity/associated sxs/prior Treatment) HPI   Blood pressure 115/73, pulse 72, temperature 98.6 F (37 C), temperature source Oral, resp. rate 15, height 5\' 3"  (1.6 m), weight 163 lb 1.6 oz (73.982 kg), SpO2 100 %, unknown if currently breastfeeding.  Leslie Owen is a 34 y.o. female complaining of acute onset of left sided chest pain radiating to the back associated with shortness of breath onset this morning. Patient denies cough, hemoptysis, calf pain, leg swelling. Patient was diagnosed with PE in February 2016, she was initially started on Coumadin but INR was difficult to maintain in the therapeutic range so she was switched to Xarelto 2 months ago. Patient states she's been compliant. She denies any recent travel, immobilizations, calf pain, swelling, fever. She's been taking Vicodin and Tylenol at home with little relief.   PCP: Lin Landsman  Past Medical History  Diagnosis Date  . Ulcer   . Encounter for diagnostic endoscopy   . Uterine fibroids affecting pregnancy   . Gall bladder stones   . Anemia   . Pulmonary embolus    Past Surgical History  Procedure Laterality Date  . Cesarean section    . Myomectomy    . Laporoscopy    . Ovarian cysts    . Wisdom tooth extraction    . Cholecystectomy    . Abdominal hysterectomy     Family History  Problem Relation Age of Onset  . Arthritis Mother   . Hypertension Mother   . Heart disease Father   . Cancer Maternal Aunt     leukemia   . Cancer Maternal Grandfather 60    lung cancer   . Cancer Maternal Aunt     breast ca  . Cancer Maternal Aunt     lung ca   Social History  Substance Use Topics  . Smoking status: Former Smoker -- 0.25 packs/day for .5 years     Types: Cigarettes    Quit date: 10/09/2001  . Smokeless tobacco: Never Used  . Alcohol Use: No   OB History    Gravida Para Term Preterm AB TAB SAB Ectopic Multiple Living   3 2 1 1      3      Review of Systems  10 systems reviewed and found to be negative, except as noted in the HPI.  Allergies  Review of patient's allergies indicates no known allergies.  Home Medications   Prior to Admission medications   Medication Sig Start Date End Date Taking? Authorizing Provider  Acetaminophen 500 MG coapsule Take 325 mg by mouth every 6 (six) hours as needed for pain.    Yes Historical Provider, MD  rivaroxaban (XARELTO) 20 MG TABS tablet Take 1 tablet (20 mg total) by mouth daily with supper. 02/13/15  Yes Truitt Merle, MD  HYDROcodone-acetaminophen (NORCO/VICODIN) 5-325 MG tablet Take 1-2 tablets by mouth every 6 hours as needed for pain and/or cough. 04/22/15   Nicole Pisciotta, PA-C  HYDROcodone-ibuprofen (VICOPROFEN) 7.5-200 MG tablet Take 1 tablet by mouth every 4 (four) hours as needed. 03/11/15   Historical Provider, MD   BP 96/62 mmHg  Pulse 93  Temp(Src) 98.6 F (37 C) (Oral)  Resp 15  Ht 5\' 3"  (1.6 m)  Wt 163  lb 1.6 oz (73.982 kg)  BMI 28.90 kg/m2  SpO2 99% Physical Exam  Constitutional: She is oriented to person, place, and time. She appears well-developed and well-nourished. No distress.  HENT:  Head: Normocephalic and atraumatic.  Mouth/Throat: Oropharynx is clear and moist.  Eyes: Conjunctivae and EOM are normal. Pupils are equal, round, and reactive to light.  Neck: Normal range of motion. No JVD present. No tracheal deviation present.  Cardiovascular: Normal rate, regular rhythm and intact distal pulses.   Radial pulse equal bilaterally  Pulmonary/Chest: Effort normal and breath sounds normal. No stridor. No respiratory distress. She has no wheezes. She has no rales. She exhibits no tenderness.  Abdominal: Soft. She exhibits no distension and no mass. There is no  tenderness. There is no rebound and no guarding.  Musculoskeletal: Normal range of motion. She exhibits no edema or tenderness.  No calf asymmetry, superficial collaterals, palpable cords, edema, Homans sign negative bilaterally.    Neurological: She is alert and oriented to person, place, and time.  Skin: Skin is warm. She is not diaphoretic.  Psychiatric: She has a normal mood and affect.  Nursing note and vitals reviewed.   ED Course  Procedures (including critical care time) Labs Review Labs Reviewed  BASIC METABOLIC PANEL  CBC  TROPONIN I  D-DIMER, QUANTITATIVE (NOT AT [facility])    Imaging Review Dg Chest 2 View  04/22/2015   CLINICAL DATA:  Left side CP, left neck and shoulder pain, posterior thorax pain between the scapulae, SOB x2 days. Hx of pulmonary embolism in 08/2014. Nonsmoker.  EXAM: CHEST  2 VIEW  COMPARISON:  12/18/2014  FINDINGS: The heart size and mediastinal contours are within normal limits. Both lungs are clear. The visualized skeletal structures are unremarkable.  IMPRESSION: No active cardiopulmonary disease.   Electronically Signed   By: Nolon Nations M.D.   On: 04/22/2015 20:11   I have personally reviewed and evaluated these images and lab results as part of my medical decision-making.   EKG Interpretation   Date/Time:  Tuesday April 22 2015 19:09:54 EDT Ventricular Rate:  77 PR Interval:  154 QRS Duration: 66 QT Interval:  370 QTC Calculation: 418 R Axis:   61 Text Interpretation:  Normal sinus rhythm with sinus arrhythmia No  significant change since last tracing Confirmed by Wilson Singer  MD, STEPHEN  ([id number]) on 04/22/2015 8:51:10 PM      MDM   Final diagnoses:  Chest pain, unspecified chest pain type  History of pulmonary embolism  Chronic anticoagulation    Filed Vitals:   04/22/15 2100 04/22/15 2115 04/22/15 2130 04/22/15 2215  BP: 107/72 100/58 99/57 96/62   Pulse: 76 91 83 93  Temp:      TempSrc:      Resp: 19 19 18 15   Height:       Weight:      SpO2: 100% 98% 98% 99%    Medications  sodium chloride 0.9 % bolus 1,000 mL (1,000 mLs Intravenous New Bag/Given 04/22/15 2108)  morphine 4 MG/ML injection 4 mg (4 mg Intravenous Given 04/22/15 2108)    Leslie Owen is a pleasant 34 y.o. female presenting with sided chest pain and shortness of breath. Patient is anticoagulated with Xarelto and states she's been compliant. Patient is saturating well on room air, she was initially mildly tachycardic at 102 but this is resolved spontaneously. Physical exam is not consistent with DVT. Patient has had 2 chest CTs in the last 6 months, considering the patient's clinical picture I  don't think it is critical that we obtain another CT at this point. Patient is properly anticoagulated. Will obtain dimer and reevaluate.  Dimer negative. I've instructed this patient to follow with her oncologist in the next several days. I've explained to her the importance of being religiously compliant with her Xarelto we've had an extensive discussion of return precautions.  Evaluation does not show pathology that would require ongoing emergent intervention or inpatient treatment. Pt is hemodynamically stable and mentating appropriately. Discussed findings and plan with patient/guardian, who agrees with care plan. All questions answered. Return precautions discussed and outpatient follow up given.   New Prescriptions   HYDROCODONE-ACETAMINOPHEN (NORCO/VICODIN) 5-325 MG TABLET    Take 1-2 tablets by mouth every 6 hours as needed for pain and/or cough.         Monico Blitz, PA-C 04/22/15 2259  Virgel Manifold, MD 04/23/15 1540

## 2015-04-22 NOTE — ED Notes (Signed)
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  Has had a PE in the past.  Has noticed sone swelling in her legs but no bruising.

## 2015-04-22 NOTE — Discharge Instructions (Signed)
Please follow with your primary care doctor in the next 2 days for a check-up. They must obtain records for further management.  ° °Do not hesitate to return to the Emergency Department for any new, worsening or concerning symptoms.  ° ° °Chest Wall Pain °Chest wall pain is pain in or around the bones and muscles of your chest. It may take up to 6 weeks to get better. It may take longer if you must stay physically active in your work and activities.  °CAUSES  °Chest wall pain may happen on its own. However, it may be caused by: °· A viral illness like the flu. °· Injury. °· Coughing. °· Exercise. °· Arthritis. °· Fibromyalgia. °· Shingles. °HOME CARE INSTRUCTIONS  °· Avoid overtiring physical activity. Try not to strain or perform activities that cause pain. This includes any activities using your chest or your abdominal and side muscles, especially if heavy weights are used. °· Put ice on the sore area. °¨ Put ice in a plastic bag. °¨ Place a towel between your skin and the bag. °¨ Leave the ice on for 15-20 minutes per hour while awake for the first 2 days. °· Only take over-the-counter or prescription medicines for pain, discomfort, or fever as directed by your caregiver. °SEEK IMMEDIATE MEDICAL CARE IF:  °· Your pain increases, or you are very uncomfortable. °· You have a fever. °· Your chest pain becomes worse. °· You have new, unexplained symptoms. °· You have nausea or vomiting. °· You feel sweaty or lightheaded. °· You have a cough with phlegm (sputum), or you cough up blood. °MAKE SURE YOU:  °· Understand these instructions. °· Will watch your condition. °· Will get help right away if you are not doing well or get worse. °Document Released: 07/12/2005 Document Revised: 10/04/2011 Document Reviewed: 03/08/2011 °ExitCare® Patient Information ©2015 [employer]. This information is not intended to replace advice given to you by your health care provider. Make sure you discuss any questions you have with your  health care provider. ° °

## 2015-05-01 ENCOUNTER — Other Ambulatory Visit: Payer: Self-pay

## 2015-05-01 ENCOUNTER — Ambulatory Visit: Payer: Self-pay | Admitting: Hematology

## 2015-05-02 ENCOUNTER — Other Ambulatory Visit (HOSPITAL_BASED_OUTPATIENT_CLINIC_OR_DEPARTMENT_OTHER): Payer: 59

## 2015-05-02 ENCOUNTER — Telehealth: Payer: Self-pay | Admitting: Hematology

## 2015-05-02 ENCOUNTER — Encounter: Payer: Self-pay | Admitting: Hematology

## 2015-05-02 ENCOUNTER — Ambulatory Visit (HOSPITAL_BASED_OUTPATIENT_CLINIC_OR_DEPARTMENT_OTHER): Payer: 59 | Admitting: Hematology

## 2015-05-02 VITALS — BP 118/72 | HR 80 | Temp 98.2°F | Resp 18 | Ht 63.0 in | Wt 165.9 lb

## 2015-05-02 DIAGNOSIS — I2699 Other pulmonary embolism without acute cor pulmonale: Secondary | ICD-10-CM

## 2015-05-02 DIAGNOSIS — Z7901 Long term (current) use of anticoagulants: Secondary | ICD-10-CM

## 2015-05-02 DIAGNOSIS — R1010 Upper abdominal pain, unspecified: Secondary | ICD-10-CM

## 2015-05-02 LAB — CBC WITH DIFFERENTIAL/PLATELET
BASO%: 0.7 % (ref 0.0–2.0)
BASOS ABS: 0 10*3/uL (ref 0.0–0.1)
EOS%: 2.6 % (ref 0.0–7.0)
Eosinophils Absolute: 0.1 10*3/uL (ref 0.0–0.5)
HCT: 40.1 % (ref 34.8–46.6)
HGB: 13 g/dL (ref 11.6–15.9)
LYMPH%: 29.8 % (ref 14.0–49.7)
MCH: 29.3 pg (ref 25.1–34.0)
MCHC: 32.6 g/dL (ref 31.5–36.0)
MCV: 90 fL (ref 79.5–101.0)
MONO#: 0.3 10*3/uL (ref 0.1–0.9)
MONO%: 7 % (ref 0.0–14.0)
NEUT#: 2.4 10*3/uL (ref 1.5–6.5)
NEUT%: 59.9 % (ref 38.4–76.8)
Platelets: 264 10*3/uL (ref 145–400)
RBC: 4.45 10*6/uL (ref 3.70–5.45)
RDW: 14.3 % (ref 11.2–14.5)
WBC: 4 10*3/uL (ref 3.9–10.3)
lymph#: 1.2 10*3/uL (ref 0.9–3.3)

## 2015-05-02 LAB — COMPREHENSIVE METABOLIC PANEL (CC13)
ALT: 9 U/L (ref 0–55)
ANION GAP: 8 meq/L (ref 3–11)
AST: 14 U/L (ref 5–34)
Albumin: 4 g/dL (ref 3.5–5.0)
Alkaline Phosphatase: 59 U/L (ref 40–150)
BUN: 6.2 mg/dL — AB (ref 7.0–26.0)
CALCIUM: 9.3 mg/dL (ref 8.4–10.4)
CO2: 23 meq/L (ref 22–29)
CREATININE: 0.9 mg/dL (ref 0.6–1.1)
Chloride: 108 mEq/L (ref 98–109)
EGFR: >90 mL/min/{1.73_m2} (ref >90)
Glucose: 119 mg/dl (ref 70–140)
POTASSIUM: 3.8 meq/L (ref 3.5–5.1)
Sodium: 140 mEq/L (ref 136–145)
Total Bilirubin: 0.45 mg/dL (ref 0.20–1.20)
Total Protein: 6.8 g/dL (ref 6.4–8.3)

## 2015-05-02 LAB — PROTIME-INR
INR: 1.2 — AB (ref 2.00–3.50)
PROTIME: 14.4 s — AB (ref 10.6–13.4)

## 2015-05-02 NOTE — Telephone Encounter (Signed)
Gave patient avs report and appointments for December  °

## 2015-05-02 NOTE — Progress Notes (Signed)
[place]  [phone] [phone]  Clinic Follow Up Note   Patient Care Team: Lin Landsman, MD as PCP - General (Family Medicine) 05/02/2015  CHIEF COMPLAINTS/PURPOSE OF CONSULTATION:  Pulmonary embolism  HISTORY OF PRESENTING ILLNESS:  Leslie Owen 34 y.o. female is here because of recently diagnosed pulmonary embolism.  She was at her usual health status continue 09/10/2014 when she developed left chest pain, dyspnea and left arm pain. She went to ED and was found to have PE. Doppler of the low extremity was negative for DVT. She was started on lovenox 70mg  q12h for 7 days, and she was transitioned to coumadin. She has been following up with her primary care physician Dr. Thompson Caul office for Coumadin dose adjustment. Her INR was supratherapeutic last week, Coumadin dose was decreased and her INR was 1.7 yesterday.  She developed left-sided chest pain again on 09/27/2014. She went to ED again and a CT of the chest angiogram was negative for PE. The chest pain resolved spontaneously 2 days later. She however noticed pain in the left arm and left leg from yesterday, with mild edema in both extremities, and limited range of motion of the left shoulder. She denies any fever, no injury or fall.  She otherwise is doing well. She works full-time, quite active, takes care of 3 young children at home.  INTERIM HISTORY  Leslie Owen returns for follow-up.  She has been on Xarelto for 3 months now. She tolerates the medication very well, no noticeable side effects, no episodes of bleeding.    MEDICAL HISTORY:  Past Medical History  Diagnosis Date  . Ulcer   . Encounter for diagnostic endoscopy   . Uterine fibroids affecting pregnancy   . Gall bladder stones   . Anemia   . Pulmonary embolus [facility])     SURGICAL HISTORY: Past Surgical History  Procedure Laterality Date  . Cesarean section    . Myomectomy    . Laporoscopy    . Ovarian cysts    . Wisdom  tooth extraction    . Cholecystectomy    . Abdominal hysterectomy      SOCIAL HISTORY: History   Social History  . Marital Status: Married    Spouse Name: N/A  . Number of Children: 3, age of 58, 3, 2   . Years of Education: N/A   Occupational History  . Not on file.   Social History Main Topics  . Smoking status: Former Smoker -- 0.25 packs/day for .5 years    Types: Cigarettes    Quit date: 10/09/2001  . Smokeless tobacco: Never Used  . Alcohol Use: No  . Drug Use: No  . Sexual Activity: Yes   Other Topics Concern  . Not on file   Social History Narrative    FAMILY HISTORY: Family History  Problem Relation Age of Onset  . Arthritis Mother   . Hypertension Mother   . Heart disease Father   . Cancer Maternal Aunt     leukemia   . Cancer Maternal Grandfather 60    lung cancer   . Cancer Maternal Aunt     breast ca  . Cancer Maternal Aunt     lung ca    ALLERGIES:  has No Known Allergies.  MEDICATIONS:  Current Outpatient Prescriptions  Medication Sig Dispense Refill  . Acetaminophen 500 MG coapsule Take 325 mg by mouth every 6 (six) hours as needed for pain.     Marland Kitchen HYDROcodone-acetaminophen (NORCO/VICODIN) 5-325 MG tablet  Take 1-2 tablets by mouth every 6 hours as needed for pain and/or cough. 7 tablet 0  . HYDROcodone-ibuprofen (VICOPROFEN) 7.5-200 MG tablet Take 1 tablet by mouth every 4 (four) hours as needed.  0  . rivaroxaban (XARELTO) 20 MG TABS tablet Take 1 tablet (20 mg total) by mouth daily with supper. 30 tablet 3   No current facility-administered medications for this visit.    REVIEW OF SYSTEMS:   Constitutional: Denies fevers, chills or abnormal night sweats Eyes: Denies blurriness of vision, double vision or watery eyes Ears, nose, mouth, throat, and face: Denies mucositis or sore throat Respiratory: Denies cough, dyspnea or wheezes Cardiovascular: Denies palpitation, chest discomfort or lower extremity swelling Gastrointestinal:  Denies  nausea, heartburn or change in bowel habits Skin: Denies abnormal skin rashes Lymphatics: Denies new lymphadenopathy or easy bruising Neurological:Denies numbness, tingling or new weaknesses Behavioral/Psych: Mood is stable, no new changes  Ext: (+) Pain and swelling in left arm and left leg  All other systems were reviewed with the patient and are negative.  PHYSICAL EXAMINATION: ECOG PERFORMANCE STATUS: 0 - Asymptomatic  Filed Vitals:   05/02/15 0959  BP: 118/72  Pulse: 80  Temp: 98.2 F (36.8 C)  Resp: 18   Filed Weights   05/02/15 0959  Weight: 165 lb 14.4 oz (75.252 kg)    GENERAL:alert, no distress and comfortable SKIN: skin color, texture, turgor are normal, no rashes or significant lesions EYES: normal, conjunctiva are pink and non-injected, sclera clear OROPHARYNX:no exudate, no erythema and lips, buccal mucosa, and tongue normal  NECK: supple, thyroid normal size, non-tender, without nodularity LYMPH:  no palpable lymphadenopathy in the cervical, axillary or inguinal LUNGS: clear to auscultation and percussion with normal breathing effort HEART: regular rate & rhythm and no murmurs and no lower extremity edema ABDOMEN:abdomen soft, non-tender and normal bowel sounds Musculoskeletal:no cyanosis of digits and no clubbing, mild nonpitting edema on left upper extremity and left lower extremity. Tenderness of the left arm and left calf. PSYCH: alert & oriented x 3 with fluent speech NEURO: no focal motor/sensory deficits  LABORATORY DATA:  CBC Latest Ref Rng 05/02/2015 04/22/2015 02/25/2015  WBC 3.9 - 10.3 10e3/uL 4.0 5.3 3.8(L)  Hemoglobin 11.6 - 15.9 g/dL 13.0 12.9 13.2  Hematocrit 34.8 - 46.6 % 40.1 39.3 41.4  Platelets 145 - 400 10e3/uL 264 296 312    CMP Latest Ref Rng 05/02/2015 04/22/2015 02/25/2015  Glucose 70 - 140 mg/dl 119 92 93  BUN 7.0 - 26.0 mg/dL 6.2(L) 10 9  Creatinine 0.6 - 1.1 mg/dL 0.9 0.97 0.94  Sodium 136 - 145 mEq/L 140 135 139  Potassium 3.5 - 5.1  mEq/L 3.8 3.7 3.9  Chloride 101 - 111 mmol/L - 103 107  CO2 22 - 29 mEq/L 23 23 23   Calcium 8.4 - 10.4 mg/dL 9.3 9.1 9.7  Total Protein 6.4 - 8.3 g/dL 6.8 - 7.7  Total Bilirubin 0.20 - 1.20 mg/dL 0.45 - 0.5  Alkaline Phos 40 - 150 U/L 59 - 51  AST 5 - 34 U/L 14 - 19  ALT 0 - 55 U/L 9 - 11(L)     RADIOGRAPHIC STUDIES: I have personally reviewed the radiological images as listed and agreed with the findings in the report. Dg Chest 2 View  04/22/2015   CLINICAL DATA:  Left side CP, left neck and shoulder pain, posterior thorax pain between the scapulae, SOB x2 days. Hx of pulmonary embolism in 08/2014. Nonsmoker.  EXAM: CHEST  2 VIEW  COMPARISON:  12/18/2014  FINDINGS: The heart size and mediastinal contours are within normal limits. Both lungs are clear. The visualized skeletal structures are unremarkable.  IMPRESSION: No active cardiopulmonary disease.   Electronically Signed   By: Nolon Nations M.D.   On: 04/22/2015 20:11    ASSESSMENT AND PLAN: 34 year old female, without significant past medical history, presented was unprovoked PE. She does not smoke, not on oral contraceptives, no family history of thrombosis  1. Unprovoked right lower lobe pulmonary embolism -She has been on anticoagulation for several months now, I'll let her finish this Xarelto she has, and stop anticoagulation. -We discussed the risk of thrombosis recurrence when off anticoagulation, she voiced good understanding. She does not have a risk for thrombosis, we'll watch it closely -I recommend a hypercoagulation workup 1 months after she stops Xarelto. If she has significant hypercoagulopathy, will restart Xarelto then.   2. Recurrent left low chest/upper abdominal pain -This is unlikely related to her PE, which was in the right lower lobe. 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 I encouraged her to contact her primary  care physician for chest pain workup.   follow-up: Return to clinic in 2 months. We'll do lab 1 week before her visit.  All questions were answered. The patient knows to call the clinic with any problems, questions or concerns. I spent 20 minutes counseling the patient face to face. The total time spent in the appointment was 25 minutes and more than 50% was on counseling.     Truitt Merle, MD 05/02/2015 10:14 AM

## 2015-06-26 ENCOUNTER — Encounter (HOSPITAL_COMMUNITY): Payer: Self-pay

## 2015-06-26 ENCOUNTER — Emergency Department (HOSPITAL_COMMUNITY): Payer: Medicaid Other

## 2015-06-26 ENCOUNTER — Emergency Department (HOSPITAL_COMMUNITY)
Admission: EM | Admit: 2015-06-26 | Discharge: 2015-06-26 | Disposition: A | Discharge status: Home / Self Care | Payer: Medicaid Other | Attending: Emergency Medicine | Admitting: Emergency Medicine

## 2015-06-26 DIAGNOSIS — Z862 Personal history of diseases of the blood and blood-forming organs and certain disorders involving the immune mechanism: Secondary | ICD-10-CM | POA: Insufficient documentation

## 2015-06-26 DIAGNOSIS — Z872 Personal history of diseases of the skin and subcutaneous tissue: Secondary | ICD-10-CM | POA: Insufficient documentation

## 2015-06-26 DIAGNOSIS — R51 Headache: Secondary | ICD-10-CM | POA: Insufficient documentation

## 2015-06-26 DIAGNOSIS — Z7901 Long term (current) use of anticoagulants: Secondary | ICD-10-CM | POA: Diagnosis not present

## 2015-06-26 DIAGNOSIS — Z87891 Personal history of nicotine dependence: Secondary | ICD-10-CM | POA: Diagnosis not present

## 2015-06-26 DIAGNOSIS — Z86711 Personal history of pulmonary embolism: Secondary | ICD-10-CM | POA: Insufficient documentation

## 2015-06-26 DIAGNOSIS — Z8719 Personal history of other diseases of the digestive system: Secondary | ICD-10-CM | POA: Insufficient documentation

## 2015-06-26 DIAGNOSIS — G44209 Tension-type headache, unspecified, not intractable: Secondary | ICD-10-CM

## 2015-06-26 DIAGNOSIS — R519 Headache, unspecified: Secondary | ICD-10-CM

## 2015-06-26 LAB — BASIC METABOLIC PANEL
ANION GAP: 7 (ref 5–15)
BUN: 8 mg/dL (ref 6–20)
CALCIUM: 9.4 mg/dL (ref 8.9–10.3)
CO2: 22 mmol/L (ref 22–32)
Chloride: 108 mmol/L (ref 101–111)
Creatinine, Ser: 0.9 mg/dL (ref 0.44–1.00)
GFR calc non Af Amer: >60 mL/min (ref >60)
Glucose, Bld: 90 mg/dL (ref 65–99)
POTASSIUM: 3.9 mmol/L (ref 3.5–5.1)
Sodium: 137 mmol/L (ref 135–145)

## 2015-06-26 LAB — CBC WITH DIFFERENTIAL/PLATELET
BASOS ABS: 0 10*3/uL (ref 0.0–0.1)
BASOS PCT: 0 %
Eosinophils Absolute: 0.2 10*3/uL (ref 0.0–0.7)
Eosinophils Relative: 3 %
HEMATOCRIT: 37.7 % (ref 36.0–46.0)
HEMOGLOBIN: 12.4 g/dL (ref 12.0–15.0)
LYMPHS PCT: 35 %
Lymphs Abs: 2.2 10*3/uL (ref 0.7–4.0)
MCH: 29.7 pg (ref 26.0–34.0)
MCHC: 32.9 g/dL (ref 30.0–36.0)
MCV: 90.2 fL (ref 78.0–100.0)
MONOS PCT: 8 %
Monocytes Absolute: 0.5 10*3/uL (ref 0.1–1.0)
NEUTROS ABS: 3.4 10*3/uL (ref 1.7–7.7)
NEUTROS PCT: 54 %
Platelets: 278 10*3/uL (ref 150–400)
RBC: 4.18 MIL/uL (ref 3.87–5.11)
RDW: 13.2 % (ref 11.5–15.5)
WBC: 6.3 10*3/uL (ref 4.0–10.5)

## 2015-06-26 MED ORDER — HYDROCODONE-ACETAMINOPHEN 5-325 MG PO TABS: 2.0000 | ORAL_TABLET | ORAL | Status: DC | PRN

## 2015-06-26 MED ORDER — KETOROLAC TROMETHAMINE 30 MG/ML IJ SOLN
30.0000 mg | Freq: Once | INTRAMUSCULAR | Status: AC
  Administered 2015-06-26: 30 mg via INTRAVENOUS
  Filled 2015-06-26: qty 1

## 2015-06-26 MED ORDER — METHOCARBAMOL 500 MG PO TABS
1000.0000 mg | ORAL_TABLET | Freq: Once | ORAL | Status: AC
  Administered 2015-06-26: 1000 mg via ORAL
  Filled 2015-06-26: qty 2

## 2015-06-26 MED ORDER — NAPROXEN 500 MG PO TABS: 500.0000 mg | ORAL_TABLET | Freq: Two times a day (BID) | ORAL | Status: DC

## 2015-06-26 MED ORDER — GADOBENATE DIMEGLUMINE 529 MG/ML IV SOLN
15.0000 mL | Freq: Once | INTRAVENOUS | Status: AC | PRN
  Administered 2015-06-26: 15 mL via INTRAVENOUS

## 2015-06-26 MED ORDER — ONDANSETRON HCL 4 MG/2ML IJ SOLN
4.0000 mg | Freq: Once | INTRAMUSCULAR | Status: AC
  Administered 2015-06-26: 4 mg via INTRAVENOUS
  Filled 2015-06-26: qty 2

## 2015-06-26 MED ORDER — MORPHINE SULFATE (PF) 4 MG/ML IV SOLN
4.0000 mg | INTRAVENOUS | Status: DC | PRN
  Administered 2015-06-26: 4 mg via INTRAVENOUS
  Filled 2015-06-26: qty 1

## 2015-06-26 MED ORDER — METHOCARBAMOL 500 MG PO TABS: 500.0000 mg | ORAL_TABLET | Freq: Three times a day (TID) | ORAL | Status: DC | PRN

## 2015-06-26 NOTE — ED Provider Notes (Signed)
Patient reevaluated. Results of CT discussed with patient. She reports constant right-sided headache. Does report tenderness along the right temporoparietal scalp. No other migraine symptoms. May be muscle tension. I think she is appropriate for outpatient treatment. Did request was given some pain control here with some morphine and Zofran IV. Also given Robaxin by mouth, and Toradol IV. Plan is anti-inflammatory, Robaxin, an addendum her Vicodin. Primary care follow-up. Cyanosis is headache, possible muscle tension headache.  Tanna Furry, MD 06/26/15 [phone]

## 2015-06-26 NOTE — ED Notes (Addendum)
PT C/O A CONSTANT HEADACHE X1 MONTH WITH BLURRED VISION AND DIZZINESS ON AND OFF. DENIES A HX OF MIGRAINES OR RECENT COLD/FLU SYMPTOMS. PT STATES SHE STOPPED TAKING XARELTO IN October AS PER HER PMD.

## 2015-06-26 NOTE — Discharge Instructions (Signed)
Tension Headache A tension headache is a feeling of pain, pressure, or aching that is often felt over the front and sides of the head. The pain can be dull, or it can feel tight (constricting). Tension headaches are not normally associated with nausea or vomiting, and they do not get worse with physical activity. Tension headaches can last from 30 minutes to several days. This is the most common type of headache. CAUSES The exact cause of this condition is not known. Tension headaches often begin after stress, anxiety, or depression. Other triggers may include:  Alcohol.  Too much caffeine, or caffeine withdrawal.  Respiratory infections, such as colds, flu, or sinus infections.  Dental problems or teeth clenching.  Fatigue.  Holding your head and neck in the same position for a long period of time, such as while using a computer.  Smoking. SYMPTOMS Symptoms of this condition include:  A feeling of pressure around the head.  Dull, aching head pain.  Pain felt over the front and sides of the head.  Tenderness in the muscles of the head, neck, and shoulders. DIAGNOSIS This condition may be diagnosed based on your symptoms and a physical exam. Tests may be done, such as a CT scan or an MRI of your head. These tests may be done if your symptoms are severe or unusual. TREATMENT This condition may be treated with lifestyle changes and medicines to help relieve symptoms. HOME CARE INSTRUCTIONS Managing Pain  Take over-the-counter and prescription medicines only as told by your health care provider.  Lie down in a dark, quiet room when you have a headache.  If directed, apply ice to the head and neck area:  Put ice in a plastic bag.  Place a towel between your skin and the bag.  Leave the ice on for 20 minutes, 2-3 times per day.  Use a heating pad or a hot shower to apply heat to the head and neck area as told by your health care provider. Eating and Drinking  Eat meals on  a regular schedule.  Limit alcohol use.  Decrease your caffeine intake, or stop using caffeine. General Instructions  Keep all follow-up visits as told by your health care provider. This is important.  Keep a headache journal to help find out what may trigger your headaches. For example, write down:  What you eat and drink.  How much sleep you get.  Any change to your diet or medicines.  Try massage or other relaxation techniques.  Limit stress.  Sit up straight, and avoid tensing your muscles.  Do not use tobacco products, including cigarettes, chewing tobacco, or e-cigarettes. If you need help quitting, ask your health care provider.  Exercise regularly as told by your health care provider.  Get 7-9 hours of sleep, or the amount recommended by your health care provider. SEEK MEDICAL CARE IF:  Your symptoms are not helped by medicine.  You have a headache that is different from what you normally experience.  You have nausea or you vomit.  You have a fever. SEEK IMMEDIATE MEDICAL CARE IF:  Your headache becomes severe.  You have repeated vomiting.  You have a stiff neck.  You have a loss of vision.  You have problems with speech.  You have pain in your eye or ear.  You have muscular weakness or loss of muscle control.  You lose your balance or you have trouble walking.  You feel faint or you pass out.  You have confusion.     This information is not intended to replace advice given to you by your health care provider. Make sure you discuss any questions you have with your health care provider.   Document Released: 07/12/2005 Document Revised: 04/02/2015 Document Reviewed: 11/04/2014 Elsevier Interactive Patient Education 2016 [employer]  

## 2015-06-26 NOTE — ED Notes (Signed)
Patient states that she has increased pain when she moves her head. She reports that she has tried to take everything over the counter and nothing has worked.

## 2015-06-26 NOTE — ED Notes (Signed)
Patient transported to MRI 

## 2015-06-26 NOTE — ED Provider Notes (Signed)
CSN: [id number]     Arrival date & time 06/26/15  1224 History   First MD Initiated Contact with Patient 06/26/15 1354     Chief Complaint  Patient presents with  . Headache    X1 MONTH     (Consider location/radiation/quality/duration/timing/severity/associated sxs/prior Treatment) HPI Patient reports that she has had a headache now for a months duration. This is right sided and has been constant. She reports that it's a deep pressure and aching quality. She reports her also has been blurred vision associated in the right eye. Patient has not had focal weakness but she reports that she has dizziness and feels that her balance is impaired when she stands up. She is not had fall or injury. 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 She was taken off of Xarelto approximately a month ago. She also reports her mother had a history of 2 brain tumors that occurred at approximately same age as she is now, she reports they were benign but required resection. There has not been associated fever, chills, nausea or vomiting. No associated focal numbness. Past Medical History  Diagnosis Date  . Ulcer   . Encounter for diagnostic endoscopy   . Uterine fibroids affecting pregnancy   . Gall bladder stones   . Anemia   . Pulmonary embolus [facility])    Past Surgical History  Procedure Laterality Date  . Cesarean section    . Myomectomy    . Laporoscopy    . Ovarian cysts    . Wisdom tooth extraction    . Cholecystectomy    . Abdominal hysterectomy     Family History  Problem Relation Age of Onset  . Arthritis Mother   . Hypertension Mother   . Heart disease Father   . Cancer Maternal Aunt     leukemia   . Cancer Maternal Grandfather 60    lung cancer   . Cancer Maternal Aunt     breast ca  . Cancer Maternal Aunt     lung ca   Social History  Substance Use Topics  . Smoking status: Former Smoker -- 0.25 packs/day for .5 years    Types: Cigarettes    Quit date:  10/09/2001  . Smokeless tobacco: Never Used  . Alcohol Use: No   OB History    Gravida Para Term Preterm AB TAB SAB Ectopic Multiple Living   3 2 1 1      3      Review of Systems 10 Systems reviewed and are negative for acute change except as noted in the HPI.    Allergies  Review of patient's allergies indicates no known allergies.  Home Medications   Prior to Admission medications   Medication Sig Start Date End Date Taking? Authorizing Provider  Acetaminophen 500 MG coapsule Take 325 mg by mouth every 6 (six) hours as needed for pain.     Historical Provider, MD  HYDROcodone-acetaminophen (NORCO/VICODIN) 5-325 MG tablet Take 1-2 tablets by mouth every 6 hours as needed for pain and/or cough. 04/22/15   Nicole Pisciotta, PA-C  HYDROcodone-ibuprofen (VICOPROFEN) 7.5-200 MG tablet Take 1 tablet by mouth every 4 (four) hours as needed. 03/11/15   Historical Provider, MD  rivaroxaban (XARELTO) 20 MG TABS tablet Take 1 tablet (20 mg total) by mouth daily with supper. 02/13/15   Truitt Merle, MD   BP 115/80 mmHg  Pulse 72  Temp(Src) 98.7 F (37.1 C) (Oral)  Resp 19  Ht 5\' 3"  (1.6  m)  Wt 160 lb (72.576 kg)  BMI 28.35 kg/m2  SpO2 95%  Breastfeeding? No Physical Exam  Constitutional: She is oriented to person, place, and time. She appears well-developed and well-nourished.  HENT:  Head: Normocephalic and atraumatic.  Bilateral TMs normal. Oral cavity is normal with good dentition. Posterior oral pharynx is widely patent.  Eyes: EOM are normal. Pupils are equal, round, and reactive to light. No scleral icterus.  Neck: Neck supple.  Cardiovascular: Normal rate, regular rhythm, normal heart sounds and intact distal pulses.   Pulmonary/Chest: Effort normal and breath sounds normal.  Abdominal: Soft. Bowel sounds are normal. She exhibits no distension. There is no tenderness.  Musculoskeletal: Normal range of motion. She exhibits no edema.  Neurological: She is alert and oriented to  person, place, and time. She has normal strength. No cranial nerve deficit. She exhibits normal muscle tone. Coordination normal. GCS eye subscore is 4. GCS verbal subscore is 5. GCS motor subscore is 6.  Normal finger-nose examination. 5 out of 5 extremity strength testing upper and lower.  Skin: Skin is warm, dry and intact.  Psychiatric: She has a normal mood and affect.    ED Course  Procedures (including critical care time) Labs Review Labs Reviewed  BASIC METABOLIC PANEL  CBC WITH DIFFERENTIAL/PLATELET    Imaging Review No results found. I have personally reviewed and evaluated these images and lab results as part of my medical decision-making.   EKG Interpretation None      MDM   Final diagnoses:  Headache   Patient has no prior history of migraines or headache disorder. She has had a constant and increasing headache for a months duration. This is consistently remained on the right side now with associated right sided visual blurring. She also identifies dizziness occurring. Patient has risk factors of family history of brain tumor and also possible coagulopathy. MRI\MRV\MRA pending for final management. Patient is nontoxic without sign of infectious etiology.    Charlesetta Shanks, MD 06/26/15 [phone]

## 2015-07-01 ENCOUNTER — Telehealth: Payer: Self-pay | Admitting: Hematology

## 2015-07-01 NOTE — Telephone Encounter (Signed)
pt cld left voicemail wantign to CX appt-cld pt back and she stated she has no insurance @ this time and she wants to r/s @ a later time

## 2015-07-03 ENCOUNTER — Other Ambulatory Visit: Payer: 59

## 2015-07-10 ENCOUNTER — Ambulatory Visit: Payer: 59 | Admitting: Hematology

## 2015-11-21 ENCOUNTER — Emergency Department (HOSPITAL_COMMUNITY): Payer: Managed Care, Other (non HMO)

## 2015-11-21 ENCOUNTER — Emergency Department (HOSPITAL_COMMUNITY)
Admission: EM | Admit: 2015-11-21 | Discharge: 2015-11-21 | Disposition: A | Discharge status: Home / Self Care | Payer: Managed Care, Other (non HMO) | Attending: Emergency Medicine | Admitting: Emergency Medicine

## 2015-11-21 ENCOUNTER — Encounter (HOSPITAL_COMMUNITY): Payer: Self-pay | Admitting: Emergency Medicine

## 2015-11-21 DIAGNOSIS — Z86711 Personal history of pulmonary embolism: Secondary | ICD-10-CM | POA: Diagnosis not present

## 2015-11-21 DIAGNOSIS — R0602 Shortness of breath: Secondary | ICD-10-CM | POA: Diagnosis not present

## 2015-11-21 DIAGNOSIS — Z862 Personal history of diseases of the blood and blood-forming organs and certain disorders involving the immune mechanism: Secondary | ICD-10-CM | POA: Diagnosis not present

## 2015-11-21 DIAGNOSIS — Z8719 Personal history of other diseases of the digestive system: Secondary | ICD-10-CM | POA: Diagnosis not present

## 2015-11-21 DIAGNOSIS — R079 Chest pain, unspecified: Secondary | ICD-10-CM | POA: Insufficient documentation

## 2015-11-21 LAB — BASIC METABOLIC PANEL
Anion gap: 9 (ref 5–15)
BUN: 10 mg/dL (ref 6–20)
CALCIUM: 9.4 mg/dL (ref 8.9–10.3)
CO2: 21 mmol/L — ABNORMAL LOW (ref 22–32)
Chloride: 108 mmol/L (ref 101–111)
Creatinine, Ser: 0.96 mg/dL (ref 0.44–1.00)
GFR calc Af Amer: >60 mL/min (ref >60)
GLUCOSE: 103 mg/dL — AB (ref 65–99)
POTASSIUM: 3.9 mmol/L (ref 3.5–5.1)
SODIUM: 138 mmol/L (ref 135–145)

## 2015-11-21 LAB — CBC
HCT: 39.5 % (ref 36.0–46.0)
Hemoglobin: 13.1 g/dL (ref 12.0–15.0)
MCH: 29.8 pg (ref 26.0–34.0)
MCHC: 33.2 g/dL (ref 30.0–36.0)
MCV: 89.8 fL (ref 78.0–100.0)
PLATELETS: 282 10*3/uL (ref 150–400)
RBC: 4.4 MIL/uL (ref 3.87–5.11)
RDW: 13.1 % (ref 11.5–15.5)
WBC: 6.4 10*3/uL (ref 4.0–10.5)

## 2015-11-21 LAB — I-STAT TROPONIN, ED: TROPONIN I, POC: 0 ng/mL (ref 0.00–0.08)

## 2015-11-21 LAB — D-DIMER, QUANTITATIVE (NOT AT ARMC)

## 2015-11-21 NOTE — ED Provider Notes (Signed)
CSN: [id number]     Arrival date & time 11/21/15  2043 History   First MD Initiated Contact with Patient 11/21/15 2217     Chief Complaint  Patient presents with  . Chest Pain     Patient is a 35 y.o. female presenting with chest pain. The history is provided by the patient. No language interpreter was used.  Chest Pain  Leslie Owen is a 35 y.o. female who presents to the Emergency Department complaining of chest pain.  She reports intermittent chest pain and shortness of breath for the last 3-4 days. Pain is worse when she lays flat. Pain is described as chest discomfort. She also reports mild swelling to the left leg split last 4 days. She has a dry cough. No fevers, nausea, diaphoresis, abdominal pain, vomiting, hemoptysis. She has a history of prior PE 1 year ago and she was on 6 months of blood thinners. No additional blood thinners at this time.  Past Medical History  Diagnosis Date  . Ulcer   . Encounter for diagnostic endoscopy   . Uterine fibroids affecting pregnancy   . Gall bladder stones   . Anemia   . Pulmonary embolus [facility])    Past Surgical History  Procedure Laterality Date  . Cesarean section    . Myomectomy    . Laporoscopy    . Ovarian cysts    . Wisdom tooth extraction    . Cholecystectomy    . Abdominal hysterectomy     Family History  Problem Relation Age of Onset  . Arthritis Mother   . Hypertension Mother   . Heart disease Father   . Cancer Maternal Aunt     leukemia   . Cancer Maternal Grandfather 60    lung cancer   . Cancer Maternal Aunt     breast ca  . Cancer Maternal Aunt     lung ca   Social History  Substance Use Topics  . Smoking status: Never Smoker   . Smokeless tobacco: Never Used  . Alcohol Use: No   OB History    Gravida Para Term Preterm AB TAB SAB Ectopic Multiple Living   3 2 1 1      3      Review of Systems  Cardiovascular: Positive for chest pain.  All other systems reviewed and are negative.     Allergies   Review of patient's allergies indicates no known allergies.  Home Medications   Prior to Admission medications   Medication Sig Start Date End Date Taking? Authorizing Provider  acetaminophen (TYLENOL) 500 MG tablet Take 1,000 mg by mouth every 6 (six) hours as needed for moderate pain.   Yes Historical Provider, MD  HYDROcodone-acetaminophen (NORCO/VICODIN) 5-325 MG tablet Take 2 tablets by mouth every 4 (four) hours as needed. Patient not taking: Reported on 11/21/2015 06/26/15   Tanna Furry, MD  methocarbamol (ROBAXIN) 500 MG tablet Take 1 tablet (500 mg total) by mouth 3 (three) times daily between meals as needed. Patient not taking: Reported on 11/21/2015 06/26/15   Tanna Furry, MD  naproxen (NAPROSYN) 500 MG tablet Take 1 tablet (500 mg total) by mouth 2 (two) times daily. Patient not taking: Reported on 11/21/2015 06/26/15   Tanna Furry, MD  rivaroxaban (XARELTO) 20 MG TABS tablet Take 1 tablet (20 mg total) by mouth daily with supper. Patient not taking: Reported on 11/21/2015 02/13/15   Truitt Merle, MD   BP 98/64 mmHg  Pulse 74  Temp(Src) 98.1 F (36.7 C) (  Oral)  Resp 16  Ht 5\' 3"  (1.6 m)  Wt 170 lb (77.111 kg)  BMI 30.12 kg/m2  SpO2 99% Physical Exam  Constitutional: She is oriented to person, place, and time. She appears well-developed and well-nourished.  HENT:  Head: Normocephalic and atraumatic.  Cardiovascular: Normal rate and regular rhythm.   No murmur heard. Pulmonary/Chest: Effort normal and breath sounds normal. No respiratory distress.  Abdominal: Soft. There is no tenderness. There is no rebound and no guarding.  Musculoskeletal: She exhibits no edema or tenderness.  Neurological: She is alert and oriented to person, place, and time.  Skin: Skin is warm and dry.  Psychiatric: She has a normal mood and affect. Her behavior is normal.  Nursing note and vitals reviewed.   ED Course  Procedures (including critical care time) Labs Review Labs Reviewed  BASIC  METABOLIC PANEL - Abnormal; Notable for the following:    CO2 21 (*)    Glucose, Bld 103 (*)    All other components within normal limits  CBC  D-DIMER, QUANTITATIVE (NOT AT [facility])  Leslie Owen, ED    Imaging Review Dg Chest 2 View  11/21/2015  CLINICAL DATA:  Centralized chest pain and shortness of breath beginning Monday. History of pulmonary embolus 1 year ago. EXAM: CHEST  2 VIEW COMPARISON:  Chest x-ray dated 04/22/2015. FINDINGS: Heart size is normal. Lungs are clear. Lung volumes are normal. No pleural effusion or pneumothorax seen. Osseous and soft tissue structures about the chest are unremarkable. There is a stable scoliosis of the thoracolumbar spine. IMPRESSION: Lungs are clear and there is no evidence of acute cardiopulmonary abnormality. Electronically Signed   By: Franki Cabot M.D.   On: 11/21/2015 21:39   I have personally reviewed and evaluated these images and lab results as part of my medical decision-making.   EKG Interpretation   Date/Time:  Friday November 21 2015 20:53:35 EDT Ventricular Rate:  79 PR Interval:  143 QRS Duration: 77 QT Interval:  379 QTC Calculation: 434 R Axis:   78 Text Interpretation:  Sinus rhythm Low voltage, precordial leads Baseline  wander in lead(s) II III aVR aVF Confirmed by Hazle Coca [phone]) on  11/21/2015 10:34:57 PM      MDM   Final diagnoses:  Chest pain, unspecified chest pain type    Patient here for evaluation of chest pain and shortness of breath. She is in no acute distress in the emergency department. History and presentation is not consistent with ACS, pneumonia, CHF. D-dimer is negative, presentation is not consistent with acute PE. Discussed home care, outpatient follow-up, return precautions.    Leslie Reichert, MD 11/21/15 [phone]

## 2015-11-21 NOTE — ED Notes (Signed)
Pt presents to ED with complaints of centralized chest pain and shortness of breath beginning Monday.  Vital signs WNL at this time.  Pt reports history of pulmonary embolism.  Mother at side.

## 2015-11-21 NOTE — Discharge Instructions (Signed)
Nonspecific Chest Pain  °Chest pain can be caused by many different conditions. There is always a chance that your pain could be related to something serious, such as a heart attack or a blood clot in your lungs. Chest pain can also be caused by conditions that are not life-threatening. If you have chest pain, it is very important to follow up with your health care provider. °CAUSES  °Chest pain can be caused by: °· Heartburn. °· Pneumonia or bronchitis. °· Anxiety or stress. °· Inflammation around your heart (pericarditis) or lung (pleuritis or pleurisy). °· A blood clot in your lung. °· A collapsed lung (pneumothorax). It can develop suddenly on its own (spontaneous pneumothorax) or from trauma to the chest. °· Shingles infection (varicella-zoster virus). °· Heart attack. °· Damage to the bones, muscles, and cartilage that make up your chest wall. This can include: °¨ Bruised bones due to injury. °¨ Strained muscles or cartilage due to frequent or repeated coughing or overwork. °¨ Fracture to one or more ribs. °¨ Sore cartilage due to inflammation (costochondritis). °RISK FACTORS  °Risk factors for chest pain may include: °· Activities that increase your risk for trauma or injury to your chest. °· Respiratory infections or conditions that cause frequent coughing. °· Medical conditions or overeating that can cause heartburn. °· Heart disease or family history of heart disease. °· Conditions or health behaviors that increase your risk of developing a blood clot. °· Having had chicken pox (varicella zoster). °SIGNS AND SYMPTOMS °Chest pain can feel like: °· Burning or tingling on the surface of your chest or deep in your chest. °· Crushing, pressure, aching, or squeezing pain. °· Dull or sharp pain that is worse when you move, cough, or take a deep breath. °· Pain that is also felt in your back, neck, shoulder, or arm, or pain that spreads to any of these areas. °Your chest pain may come and go, or it may stay  constant. °DIAGNOSIS °Lab tests or other studies may be needed to find the cause of your pain. Your health care provider may have you take a test called an ambulatory ECG (electrocardiogram). An ECG records your heartbeat patterns at the time the test is performed. You may also have other tests, such as: °· Transthoracic echocardiogram (TTE). During echocardiography, sound waves are used to create a picture of all of the heart structures and to look at how blood flows through your heart. °· Transesophageal echocardiogram (TEE). This is a more advanced imaging test that obtains images from inside your body. It allows your health care provider to see your heart in finer detail. °· Cardiac monitoring. This allows your health care provider to monitor your heart rate and rhythm in real time. °· Holter monitor. This is a portable device that records your heartbeat and can help to diagnose abnormal heartbeats. It allows your health care provider to track your heart activity for several days, if needed. °· Stress tests. These can be done through exercise or by taking medicine that makes your heart beat more quickly. °· Blood tests. °· Imaging tests. °TREATMENT  °Your treatment depends on what is causing your chest pain. Treatment may include: °· Medicines. These may include: °¨ Acid blockers for heartburn. °¨ Anti-inflammatory medicine. °¨ Pain medicine for inflammatory conditions. °¨ Antibiotic medicine, if an infection is present. °¨ Medicines to dissolve blood clots. °¨ Medicines to treat coronary artery disease. °· Supportive care for conditions that do not require medicines. This may include: °¨ Resting. °¨ Applying heat   or cold packs to injured areas. °¨ Limiting activities until pain decreases. °HOME CARE INSTRUCTIONS °· If you were prescribed an antibiotic medicine, finish it all even if you start to feel better. °· Avoid any activities that bring on chest pain. °· Do not use any tobacco products, including  cigarettes, chewing tobacco, or electronic cigarettes. If you need help quitting, ask your health care provider. °· Do not drink alcohol. °· Take medicines only as directed by your health care provider. °· Keep all follow-up visits as directed by your health care provider. This is important. This includes any further testing if your chest pain does not go away. °· If heartburn is the cause for your chest pain, you may be told to keep your head raised (elevated) while sleeping. This reduces the chance that acid will go from your stomach into your esophagus. °· Make lifestyle changes as directed by your health care provider. These may include: °¨ Getting regular exercise. Ask your health care provider to suggest some activities that are safe for you. °¨ Eating a heart-healthy diet. A registered dietitian can help you to learn healthy eating options. °¨ Maintaining a healthy weight. °¨ Managing diabetes, if necessary. °¨ Reducing stress. °SEEK MEDICAL CARE IF: °· Your chest pain does not go away after treatment. °· You have a rash with blisters on your chest. °· You have a fever. °SEEK IMMEDIATE MEDICAL CARE IF:  °· Your chest pain is worse. °· You have an increasing cough, or you cough up blood. °· You have severe abdominal pain. °· You have severe weakness. °· You faint. °· You have chills. °· You have sudden, unexplained chest discomfort. °· You have sudden, unexplained discomfort in your arms, back, neck, or jaw. °· You have shortness of breath at any time. °· You suddenly start to sweat, or your skin gets clammy. °· You feel nauseous or you vomit. °· You suddenly feel light-headed or dizzy. °· Your heart begins to beat quickly, or it feels like it is skipping beats. °These symptoms may represent a serious problem that is an emergency. Do not wait to see if the symptoms will go away. Get medical help right away. Call your local emergency services (911 in the [place] Do not drive yourself to the hospital. °  °This  information is not intended to replace advice given to you by your health care provider. Make sure you discuss any questions you have with your health care provider. °  °Document Released: 04/21/2005 Document Revised: 08/02/2014 Document Reviewed: 02/15/2014 °Elsevier Interactive Patient Education ©2016 [employer] ° °

## 2015-12-02 ENCOUNTER — Encounter (HOSPITAL_COMMUNITY): Payer: Self-pay | Admitting: Emergency Medicine

## 2015-12-02 ENCOUNTER — Emergency Department (HOSPITAL_COMMUNITY)
Admission: EM | Admit: 2015-12-02 | Discharge: 2015-12-02 | Disposition: A | Discharge status: Home / Self Care | Payer: Managed Care, Other (non HMO) | Attending: Emergency Medicine | Admitting: Emergency Medicine

## 2015-12-02 ENCOUNTER — Emergency Department (HOSPITAL_COMMUNITY): Payer: Managed Care, Other (non HMO)

## 2015-12-02 DIAGNOSIS — R1031 Right lower quadrant pain: Secondary | ICD-10-CM | POA: Insufficient documentation

## 2015-12-02 DIAGNOSIS — Z86018 Personal history of other benign neoplasm: Secondary | ICD-10-CM | POA: Diagnosis not present

## 2015-12-02 DIAGNOSIS — Z862 Personal history of diseases of the blood and blood-forming organs and certain disorders involving the immune mechanism: Secondary | ICD-10-CM | POA: Insufficient documentation

## 2015-12-02 DIAGNOSIS — R1033 Periumbilical pain: Secondary | ICD-10-CM | POA: Insufficient documentation

## 2015-12-02 DIAGNOSIS — Z9049 Acquired absence of other specified parts of digestive tract: Secondary | ICD-10-CM | POA: Diagnosis not present

## 2015-12-02 DIAGNOSIS — Z86711 Personal history of pulmonary embolism: Secondary | ICD-10-CM | POA: Insufficient documentation

## 2015-12-02 DIAGNOSIS — Z90710 Acquired absence of both cervix and uterus: Secondary | ICD-10-CM | POA: Insufficient documentation

## 2015-12-02 DIAGNOSIS — Z8719 Personal history of other diseases of the digestive system: Secondary | ICD-10-CM | POA: Insufficient documentation

## 2015-12-02 DIAGNOSIS — Z872 Personal history of diseases of the skin and subcutaneous tissue: Secondary | ICD-10-CM | POA: Diagnosis not present

## 2015-12-02 LAB — CBC
HEMATOCRIT: 39.2 % (ref 36.0–46.0)
Hemoglobin: 13.2 g/dL (ref 12.0–15.0)
MCH: 30.4 pg (ref 26.0–34.0)
MCHC: 33.7 g/dL (ref 30.0–36.0)
MCV: 90.3 fL (ref 78.0–100.0)
Platelets: 285 10*3/uL (ref 150–400)
RBC: 4.34 MIL/uL (ref 3.87–5.11)
RDW: 13.2 % (ref 11.5–15.5)
WBC: 4.4 10*3/uL (ref 4.0–10.5)

## 2015-12-02 LAB — URINALYSIS, ROUTINE W REFLEX MICROSCOPIC
Bilirubin Urine: NEGATIVE
GLUCOSE, UA: NEGATIVE mg/dL
Hgb urine dipstick: NEGATIVE
Ketones, ur: NEGATIVE mg/dL
LEUKOCYTES UA: NEGATIVE
Nitrite: NEGATIVE
PH: 6.5 (ref 5.0–8.0)
Protein, ur: NEGATIVE mg/dL
Specific Gravity, Urine: 1.023 (ref 1.005–1.030)

## 2015-12-02 LAB — COMPREHENSIVE METABOLIC PANEL
ALT: 13 U/L — ABNORMAL LOW (ref 14–54)
AST: 22 U/L (ref 15–41)
Albumin: 4 g/dL (ref 3.5–5.0)
Alkaline Phosphatase: 46 U/L (ref 38–126)
Anion gap: 8 (ref 5–15)
BUN: 10 mg/dL (ref 6–20)
CHLORIDE: 109 mmol/L (ref 101–111)
CO2: 22 mmol/L (ref 22–32)
Calcium: 9.2 mg/dL (ref 8.9–10.3)
Creatinine, Ser: 0.88 mg/dL (ref 0.44–1.00)
Glucose, Bld: 109 mg/dL — ABNORMAL HIGH (ref 65–99)
POTASSIUM: 4.3 mmol/L (ref 3.5–5.1)
SODIUM: 139 mmol/L (ref 135–145)
Total Bilirubin: 1.1 mg/dL (ref 0.3–1.2)
Total Protein: 6.9 g/dL (ref 6.5–8.1)

## 2015-12-02 LAB — LIPASE, BLOOD: LIPASE: 29 U/L (ref 11–51)

## 2015-12-02 MED ORDER — IOPAMIDOL (ISOVUE-300) INJECTION 61%
100.0000 mL | Freq: Once | INTRAVENOUS | Status: AC | PRN
  Administered 2015-12-02: 100 mL via INTRAVENOUS

## 2015-12-02 MED ORDER — SODIUM CHLORIDE 0.9 % IV BOLUS (SEPSIS)
1000.0000 mL | Freq: Once | INTRAVENOUS | Status: AC
  Administered 2015-12-02: 1000 mL via INTRAVENOUS

## 2015-12-02 MED ORDER — ACETAMINOPHEN-CODEINE #3 300-30 MG PO TABS: 1.0000 | ORAL_TABLET | Freq: Four times a day (QID) | ORAL | Status: DC | PRN

## 2015-12-02 MED ORDER — DIATRIZOATE MEGLUMINE & SODIUM 66-10 % PO SOLN
30.0000 mL | Freq: Once | ORAL | Status: AC
  Administered 2015-12-02: 30 mL via ORAL

## 2015-12-02 MED ORDER — MORPHINE SULFATE (PF) 4 MG/ML IV SOLN
4.0000 mg | Freq: Once | INTRAVENOUS | Status: AC
  Administered 2015-12-02: 4 mg via INTRAVENOUS
  Filled 2015-12-02: qty 1

## 2015-12-02 MED ORDER — ONDANSETRON HCL 4 MG/2ML IJ SOLN
4.0000 mg | Freq: Once | INTRAMUSCULAR | Status: AC
Start: 2015-12-02 — End: 2015-12-02
  Administered 2015-12-02: 4 mg via INTRAVENOUS
  Filled 2015-12-02: qty 2

## 2015-12-02 NOTE — ED Notes (Signed)
Pt states that she has been having periumbilical pain since Friday.  Constant pain.  Denies any NVD.

## 2015-12-02 NOTE — ED Notes (Signed)
Patient was educated not to drive, operate heavy machinery, or drink alcohol while taking narcotic medication.  

## 2015-12-02 NOTE — ED Notes (Signed)
Pt returned from CT °

## 2015-12-02 NOTE — Discharge Instructions (Signed)
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  Take tylenol #3 as needed for pain and follow up with your doctor for further care.    Abdominal Pain, Adult Many things can cause abdominal pain. Usually, abdominal pain is not caused by a disease and will improve without treatment. It can often be observed and treated at home. Your health care provider will do a physical exam and possibly order blood tests and X-rays to help determine the seriousness of your pain. However, in many cases, more time must pass before a clear cause of the pain can be found. Before that point, your health care provider may not know if you need more testing or further treatment. HOME CARE INSTRUCTIONS Monitor your abdominal pain for any changes. The following actions may help to alleviate any discomfort you are experiencing:  Only take over-the-counter or prescription medicines as directed by your health care provider.  Do not take laxatives unless directed to do so by your health care provider.  Try a clear liquid diet (broth, tea, or water) as directed by your health care provider. Slowly move to a bland diet as tolerated. SEEK MEDICAL CARE IF:  You have unexplained abdominal pain.  You have abdominal pain associated with nausea or diarrhea.  You have pain when you urinate or have a bowel movement.  You experience abdominal pain that wakes you in the night.  You have abdominal pain that is worsened or improved by eating food.  You have abdominal pain that is worsened with eating fatty foods.  You have a fever. SEEK IMMEDIATE MEDICAL CARE IF:  Your pain does not go away within 2 hours.  You keep throwing up (vomiting).  Your pain is felt only in portions of the abdomen, such as the right side or the left lower portion of the abdomen.  You pass bloody or black tarry stools. MAKE SURE YOU:  Understand these instructions.  Will watch your condition.  Will get help right away if  you are not doing well or get worse.   This information is not intended to replace advice given to you by your health care provider. Make sure you discuss any questions you have with your health care provider.   Document Released: 04/21/2005 Document Revised: 04/02/2015 Document Reviewed: 03/21/2013 Elsevier Interactive Patient Education [employer].

## 2015-12-02 NOTE — ED Provider Notes (Signed)
CSN: [id number]     Arrival date & time 12/02/15  0825 History   First MD Initiated Contact with Patient 12/02/15 1143     Chief Complaint  Patient presents with  . Abdominal Pain     (Consider location/radiation/quality/duration/timing/severity/associated sxs/prior Treatment) HPI   35 year old female with history of uterine fibroid s/p hysterectomy, cholelithiasis s/p cholecystectomy, anemia presenting with complaints of abdominal pain. Patient report for the past 2 days she has had persistent periumbilical abdominal pain. She described the pain is an achy sensation with occasional sharp pain that radiates to her back. Pain is currently 6 out of 10. She endorse decreased appetite, and had not had a bowel movement in the past 2 days, she usually has bowel movement daily. She is able to pass flatus. No complaint of fever, chills, productive cough, chest pain, shortness of breath, dysuria, hematuria, vaginal vaginal discharge, hematochezia or melena. She denies any recent abdominal injury. No recent change in sexual partner. No prior history of STD. Patient still has intact appendix. She denies any prior history of small bowel obstruction.  Past Medical History  Diagnosis Date  . Ulcer   . Encounter for diagnostic endoscopy   . Uterine fibroids affecting pregnancy   . Gall bladder stones   . Anemia   . Pulmonary embolus [facility])    Past Surgical History  Procedure Laterality Date  . Cesarean section    . Myomectomy    . Laporoscopy    . Ovarian cysts    . Wisdom tooth extraction    . Cholecystectomy    . Abdominal hysterectomy     Family History  Problem Relation Age of Onset  . Arthritis Mother   . Hypertension Mother   . Heart disease Father   . Cancer Maternal Aunt     leukemia   . Cancer Maternal Grandfather 60    lung cancer   . Cancer Maternal Aunt     breast ca  . Cancer Maternal Aunt     lung ca   Social History  Substance Use Topics  . Smoking status: Never Smoker    . Smokeless tobacco: Never Used  . Alcohol Use: No   OB History    Gravida Para Term Preterm AB TAB SAB Ectopic Multiple Living   3 2 1 1      3      Review of Systems  All other systems reviewed and are negative.     Allergies  Review of patient's allergies indicates no known allergies.  Home Medications   Prior to Admission medications   Medication Sig Start Date End Date Taking? Authorizing Provider  acetaminophen (TYLENOL) 500 MG tablet Take 1,000 mg by mouth every 6 (six) hours as needed for moderate pain.   Yes Historical Provider, MD  HYDROcodone-acetaminophen (NORCO/VICODIN) 5-325 MG tablet Take 2 tablets by mouth every 4 (four) hours as needed. Patient not taking: Reported on 11/21/2015 06/26/15   Tanna Furry, MD  methocarbamol (ROBAXIN) 500 MG tablet Take 1 tablet (500 mg total) by mouth 3 (three) times daily between meals as needed. Patient not taking: Reported on 11/21/2015 06/26/15   Tanna Furry, MD  naproxen (NAPROSYN) 500 MG tablet Take 1 tablet (500 mg total) by mouth 2 (two) times daily. Patient not taking: Reported on 11/21/2015 06/26/15   Tanna Furry, MD  rivaroxaban (XARELTO) 20 MG TABS tablet Take 1 tablet (20 mg total) by mouth daily with supper. Patient not taking: Reported on 11/21/2015 02/13/15   Truitt Merle, MD  BP 121/85 mmHg  Pulse 87  Temp(Src) 98.9 F (37.2 C) (Oral)  Resp 16  SpO2 100% Physical Exam  Constitutional: She appears well-developed and well-nourished. No distress.  African-American female laying in bed in no acute discomfort, nontoxic  HENT:  Head: Atraumatic.  Eyes: Conjunctivae are normal.  Neck: Neck supple.  Cardiovascular: Normal rate and regular rhythm.   Pulmonary/Chest: Effort normal and breath sounds normal.  Abdominal: Soft. Bowel sounds are normal. She exhibits no distension. There is tenderness (Periumbilical, right lower abdomen, and suprapubic tenderness on palpation without guarding or rebound tenderness. Abdomen is soft,  bowel sounds present.). There is no rebound and no guarding.  Neurological: She is alert.  Skin: No rash noted.  Psychiatric: She has a normal mood and affect.  Nursing note and vitals reviewed.   ED Course  Procedures (including critical care time) Labs Review Labs Reviewed  COMPREHENSIVE METABOLIC PANEL - Abnormal; Notable for the following:    Glucose, Bld 109 (*)    ALT 13 (*)    All other components within normal limits  URINALYSIS, ROUTINE W REFLEX MICROSCOPIC (NOT AT [facility]) - Abnormal; Notable for the following:    APPearance CLOUDY (*)    All other components within normal limits  LIPASE, BLOOD  CBC    Imaging Review Ct Abdomen Pelvis W Contrast  12/02/2015  CLINICAL DATA:  Abdominal pain. EXAM: CT ABDOMEN AND PELVIS WITH CONTRAST TECHNIQUE: Multidetector CT imaging of the abdomen and pelvis was performed using the standard protocol following bolus administration of intravenous contrast. CONTRAST:  181mL ISOVUE-300 IOPAMIDOL (ISOVUE-300) INJECTION 61% COMPARISON:  02/25/2015 FINDINGS: Lower chest and abdominal wall: Essentially stable spiculated 16 mm nodule in the subcutaneous lower periumbilical region. This could be a laparoscopic port site. Hepatobiliary: No focal liver abnormality.Cholecystectomy which accounts for mild prominence of the biliary tree. Stable 37 mm splenic mass, with no significant enlargement since 2008, compatible with benign process Pancreas: Unremarkable. Spleen: Unremarkable. Adrenals/Urinary Tract: Negative adrenals. No hydronephrosis or stone. Unremarkable bladder. Reproductive:Hysterectomy with negative adnexa. Stomach/Bowel:  No obstruction. No appendicitis. Vascular/Lymphatic: No acute vascular abnormality. No mass or adenopathy. Peritoneal: No ascites or pneumoperitoneum. Musculoskeletal: No acute abnormalities. 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 IMPRESSION: 1. No acute finding. 2. Stable nodular scarring below the  umbilicus. Scar endometriosis is considered given appearance and patient history, correlate for cyclical pain. 3. Stable benign splenic mass. Electronically Signed   By: Monte Fantasia M.D.   On: 12/02/2015 13:58   I have personally reviewed and evaluated these images and lab results as part of my medical decision-making.   EKG Interpretation None      MDM   Final diagnoses:  Periumbilical abdominal pain    BP 100/71 mmHg  Pulse 84  Temp(Src) 98.9 F (37.2 C) (Oral)  Resp 16  SpO2 98%   12:32 PM Patient here with periumbilical abdominal pain with decrease in appetite and decreased bowel movement. She denies feeling constipated, cyst does not have the urge to have a bowel movement. She is able to pass flatus. Given her prior history of abdominal surgery and her presenting complaint, plan to obtain abdominal and pelvis CT scan for further evaluation to rule out appendicitis or SBO. Pain medication given. I will monitor patient closely.  2:54 PM No leukocytosis, normal electrolytes panel, UA without sign of urinary tract infection, an abdominal and pelvis CT scan showing no acute finding. 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 This is stable benign  splenic mass. Results were discussed with patient, patient felt reassured. She will follow-up with her PCP for further evaluation. Patient is stable for discharge. Return precaution discussed.  Domenic Moras, PA-C 12/02/15 [place], MD 12/02/15 [phone]

## 2015-12-29 ENCOUNTER — Other Ambulatory Visit: Payer: Self-pay | Admitting: Gynecology

## 2015-12-29 DIAGNOSIS — Z1231 Encounter for screening mammogram for malignant neoplasm of breast: Secondary | ICD-10-CM

## 2016-01-02 ENCOUNTER — Encounter: Payer: Self-pay | Admitting: Gynecology

## 2016-01-02 ENCOUNTER — Ambulatory Visit (INDEPENDENT_AMBULATORY_CARE_PROVIDER_SITE_OTHER): Payer: Managed Care, Other (non HMO) | Admitting: Gynecology

## 2016-01-02 VITALS — BP 124/80

## 2016-01-02 DIAGNOSIS — R1033 Periumbilical pain: Secondary | ICD-10-CM

## 2016-01-02 DIAGNOSIS — Z8742 Personal history of other diseases of the female genital tract: Secondary | ICD-10-CM | POA: Diagnosis not present

## 2016-01-02 MED ORDER — HYDROCODONE-ACETAMINOPHEN 5-325 MG PO TABS: 1.0000 | ORAL_TABLET | Freq: Four times a day (QID) | ORAL | Status: DC | PRN

## 2016-01-02 NOTE — Patient Instructions (Signed)
Acetaminophen; Hydrocodone tablets or capsules What is this medicine? ACETAMINOPHEN; HYDROCODONE (a set a MEE noe fen; hye droe KOE done) is a pain reliever. It is used to treat moderate to severe pain. This medicine may be used for other purposes; ask your health care provider or pharmacist if you have questions. What should I tell my health care provider before I take this medicine? They need to know if you have any of these conditions: -brain tumor -Crohn's disease, inflammatory bowel disease, or ulcerative colitis -drug abuse or addiction -head injury -heart or circulation problems -if you often drink alcohol -kidney disease or problems going to the bathroom -liver disease -lung disease, asthma, or breathing problems -an unusual or allergic reaction to acetaminophen, hydrocodone, other opioid analgesics, other medicines, foods, dyes, or preservatives -pregnant or trying to get pregnant -breast-feeding How should I use this medicine? Take this medicine by mouth. Swallow it with a full glass of water. Follow the directions on the prescription label. If the medicine upsets your stomach, take the medicine with food or milk. Do not take more than you are told to take. Talk to your pediatrician regarding the use of this medicine in children. This medicine is not approved for use in children. Patients over 65 years may have a stronger reaction and need a smaller dose. Overdosage: If you think you have taken too much of this medicine contact a poison control center or emergency room at once. NOTE: This medicine is only for you. Do not share this medicine with others. What if I miss a dose? If you miss a dose, take it as soon as you can. If it is almost time for your next dose, take only that dose. Do not take double or extra doses. What may interact with this medicine? -alcohol -antihistamines -isoniazid -medicines for depression, anxiety, or psychotic disturbances -medicines for  sleep -muscle relaxants -naltrexone -narcotic medicines (opiates) for pain -phenobarbital -ritonavir -tramadol This list may not describe all possible interactions. Give your health care provider a list of all the medicines, herbs, non-prescription drugs, or dietary supplements you use. Also tell them if you smoke, drink alcohol, or use illegal drugs. Some items may interact with your medicine. What should I watch for while using this medicine? Tell your doctor or health care professional if your pain does not go away, if it gets worse, or if you have new or a different type of pain. You may develop tolerance to the medicine. Tolerance means that you will need a higher dose of the medicine for pain relief. Tolerance is normal and is expected if you take the medicine for a long time. Do not suddenly stop taking your medicine because you may develop a severe reaction. Your body becomes used to the medicine. This does NOT mean you are addicted. Addiction is a behavior related to getting and using a drug for a non-medical reason. If you have pain, you have a medical reason to take pain medicine. Your doctor will tell you how much medicine to take. If your doctor wants you to stop the medicine, the dose will be slowly lowered over time to avoid any side effects. You may get drowsy or dizzy when you first start taking the medicine or change doses. Do not drive, use machinery, or do anything that may be dangerous until you know how the medicine affects you. Stand or sit up slowly. There are different types of narcotic medicines (opiates) for pain. If you take more than one type at the  same time, you may have more side effects. Give your health care provider a list of all medicines you use. Your doctor will tell you how much medicine to take. Do not take more medicine than directed. Call emergency for help if you have problems breathing. The medicine will cause constipation. Try to have a bowel movement at  least every 2 to 3 days. If you do not have a bowel movement for 3 days, call your doctor or health care professional. Too much acetaminophen can be very dangerous. Do not take Tylenol (acetaminophen) or medicines that contain acetaminophen with this medicine. Many non-prescription medicines contain acetaminophen. Always read the labels carefully. What side effects may I notice from receiving this medicine? Side effects that you should report to your doctor or health care professional as soon as possible: -allergic reactions like skin rash, itching or hives, swelling of the face, lips, or tongue -breathing problems -confusion -feeling faint or lightheaded, falls -stomach pain -yellowing of the eyes or skin Side effects that usually do not require medical attention (report to your doctor or health care professional if they continue or are bothersome): -nausea, vomiting -stomach upset This list may not describe all possible side effects. Call your doctor for medical advice about side effects. You may report side effects to FDA at 1-800-FDA-1088. Where should I keep my medicine? Keep out of the reach of children. This medicine can be abused. Keep your medicine in a safe place to protect it from theft. Do not share this medicine with anyone. Selling or giving away this medicine is dangerous and against the law. This medicine may cause accidental overdose and death if it taken by other adults, children, or pets. Mix any unused medicine with a substance like cat litter or coffee grounds. Then throw the medicine away in a sealed container like a sealed bag or a coffee can with a lid. Do not use the medicine after the expiration date. Store at room temperature between 15 and 30 degrees C (59 and 86 degrees F). NOTE: This sheet is a summary. It may not cover all possible information. If you have questions about this medicine, talk to your doctor, pharmacist, or health care provider.    2016,  Elsevier/Gold Standard. (2014-06-12 15:29:20) Endometriosis Endometriosis is a condition in which the tissue that lines the uterus (endometrium) grows outside of its normal location. The tissue may grow in many locations close to the uterus, but it commonly grows on the ovaries, fallopian tubes, vagina, or bowel. Because the uterus expels, or sheds, its lining every menstrual cycle, there is bleeding wherever the endometrial tissue is located. This can cause pain because blood is irritating to tissues not normally exposed to it.  CAUSES  The cause of endometriosis is not known.  SIGNS AND SYMPTOMS  Often, there are no symptoms. When symptoms are present, they can vary with the location of the displaced tissue. Various symptoms can occur at different times. Although symptoms occur mainly during a woman's menstrual period, they can also occur midcycle and usually stop with menopause. Some people may go months with no symptoms at all. Symptoms may include:   Back or abdominal pain.   Heavier bleeding during periods.   Pain during intercourse.   Painful bowel movements.   Infertility. DIAGNOSIS  Your health care provider will do a physical exam and ask about your symptoms. Various tests may be done, such as:   Blood tests and urine tests. These are done to help rule out other  problems.   Ultrasound. This test is done to look for abnormal tissue.   An X-ray of the lower bowel (barium enema).  Laparoscopy. In this procedure, a thin, lighted tube with a tiny camera on the end (laparoscope) is inserted into your abdomen. This helps your health care provider look for abnormal tissue to confirm the diagnosis. The health care provider may also remove a small piece of tissue (biopsy) from any abnormal tissue found. This tissue sample can then be sent to a lab so it can be looked at under a microscope. TREATMENT  Treatment will vary and may include:   Medicines to relieve pain. Nonsteroidal  anti-inflammatory drugs (NSAIDs) are a type of pain medicine that can help to relieve the pain caused by endometriosis.  Hormonal therapy. When using hormonal therapy, periods are eliminated. This eliminates the monthly exposure to blood by the displaced endometrial tissue.   Surgery. Surgery may sometimes be done to remove the abnormal endometrial tissue. In severe cases, surgery may be done to remove the fallopian tubes, uterus, and ovaries (hysterectomy). HOME CARE INSTRUCTIONS   Take all medicines as directed by your health care provider. Do not take aspirin because it may increase bleeding when you are not on hormonal therapy.   Avoid activities that produce pain, including sexual activity. SEEK MEDICAL CARE IF:  You have pelvic pain before, after, or during your periods.  You have pelvic pain between periods that gets worse during your period.  You have pelvic pain during or after sex.  You have pelvic pain with bowel movements or urination, especially during your period.  You have problems getting pregnant.  You have a fever. SEEK IMMEDIATE MEDICAL CARE IF:   Your pain is severe and is not responding to pain medicine.   You have severe nausea and vomiting, or you cannot keep foods down.   You have pain that is limited to the right lower part of your abdomen.   You have swelling or increasing pain in your abdomen.   You see blood in your stool.  MAKE SURE YOU:   Understand these instructions.  Will watch your condition.  Will get help right away if you are not doing well or get worse.   This information is not intended to replace advice given to you by your health care provider. Make sure you discuss any questions you have with your health care provider.   Document Released: 07/09/2000 Document Revised: 08/02/2014 Document Reviewed: 03/09/2013 Elsevier Interactive Patient Education [employer].

## 2016-01-02 NOTE — Progress Notes (Signed)
   HPI: Patient is a 35 year old presented to the office stating that the past month and half she's been complaining of periorbital like her pain on a daily basis. Also she's had some dyspareunia. Her past medical history and surgical history as follows:  Abdominal myomectomy Laparoscopy 2 for endometriosis Both of these operations were in [facility]  Patient then had endometrial ablation in [place] followed by robotic hysterectomy for fibroid uterus and endometriosis in 2015  Patient with prior history of cholecystectomy.  Patient with history of pulmonary embolism in 2016 had been on Coumadin now on no blood thinner.  Patient was seen in the emergency room on May 9 with severe umbilicus pain. And a CT scan was done which was compared with 1 several years ago and the following was reported:  IMPRESSION: 1. No acute finding. 2. Stable nodular scarring below the umbilicus. Scar endometriosis is considered given appearance and patient history, correlate for cyclical pain. 3. Stable benign splenic mass.  That ED visit her comprehensive metabolic panel and CBC were normal   ROS: A ROS was performed and pertinent positives and negatives are included in the history.  GENERAL: No fevers or chills. HEENT: No change in vision, no earache, sore throat or sinus congestion. NECK: No pain or stiffness. CARDIOVASCULAR: No chest pain or pressure. No palpitations. PULMONARY: No shortness of breath, cough or wheeze. GASTROINTESTINAL: Umbilical pain, nausea, vomiting or diarrhea, melena or bright red blood per rectum. GENITOURINARY: No urinary frequency, urgency, hesitancy or dysuria. MUSCULOSKELETAL: No joint or muscle pain, no back pain, no recent trauma. DERMATOLOGIC: No rash, no itching, no lesions. ENDOCRINE: No polyuria, polydipsia, no heat or cold intolerance. No recent change in weight. HEMATOLOGICAL: No anemia or easy bruising or bleeding. NEUROLOGIC: No headache, seizures,  numbness, tingling or weakness. PSYCHIATRIC: No depression, no loss of interest in normal activity or change in sleep pattern.   PE: Blood pressure 124/80 Both of her ears well nourished female in no acute distress Abdomen: Soft nontender no rebound or guarding Umbilicus: Tender slight small nodular area but cannot reproduce any signs of hernia but tender to attach Pelvic: Bartholin urethra Skene was within normal limits Vagina: No lesions or discharge Vaginal cuff intact Bimanual exam no palpable masses or tenderness Rectal vaginal exam unremarkable  Assessment Plan: 35 year old patient with multiple laparoscopic procedures with nodular area and noted the umbilicus at the area of the previous scar possibly endometriosis. Going to refer her to general surgeon for excision of this area suspicious for endometriosis. Patient with prior history of DVT currently on no anticoagulation. Many years ago when they first diagnosed her with endometriosis she had been on on Lupron. We discussed that could be one treatment option but this symptoms would returned and this could be just short lived and definitive surgery as removing this nodularity with endometriosis will improve her quality or if and this is the reason for referral.    Greater than 50% of time was spent in counseling and coordinating care of this patient.   Time of consultation: 15   Minutes.

## 2016-01-05 ENCOUNTER — Telehealth: Payer: Self-pay | Admitting: *Deleted

## 2016-01-05 NOTE — Telephone Encounter (Signed)
Appointment on 01/13/16 @ 3:10pm with Dr.Ramirez, pt aware.

## 2016-01-05 NOTE — Telephone Encounter (Signed)
Referral faxed to [facility] [place] [facility] they will fax me back with time and date. 

## 2016-01-05 NOTE — Telephone Encounter (Signed)
-----   Message from Terrance Mass, MD sent at 01/02/2016 12:17 PM EDT ----- Please make an appointment for this patient with a general surgeon for umbilical nodularity with history of endometriosis

## 2016-01-29 ENCOUNTER — Ambulatory Visit
Admission: RE | Admit: 2016-01-29 | Discharge: 2016-01-29 | Disposition: A | Discharge status: Home / Self Care | Payer: Managed Care, Other (non HMO) | Source: Ambulatory Visit | Attending: Gynecology | Admitting: Gynecology

## 2016-01-29 ENCOUNTER — Other Ambulatory Visit: Payer: Self-pay | Admitting: Gynecology

## 2016-01-29 DIAGNOSIS — N644 Mastodynia: Secondary | ICD-10-CM

## 2016-01-29 DIAGNOSIS — Z1231 Encounter for screening mammogram for malignant neoplasm of breast: Secondary | ICD-10-CM

## 2016-01-30 ENCOUNTER — Ambulatory Visit (INDEPENDENT_AMBULATORY_CARE_PROVIDER_SITE_OTHER): Payer: Managed Care, Other (non HMO) | Admitting: Gynecology

## 2016-01-30 ENCOUNTER — Encounter: Payer: Self-pay | Admitting: Gynecology

## 2016-01-30 VITALS — BP 120/84 | Ht 63.0 in | Wt 167.0 lb

## 2016-01-30 DIAGNOSIS — Z8742 Personal history of other diseases of the female genital tract: Secondary | ICD-10-CM

## 2016-01-30 DIAGNOSIS — R1033 Periumbilical pain: Secondary | ICD-10-CM | POA: Diagnosis not present

## 2016-01-30 DIAGNOSIS — Z01419 Encounter for gynecological examination (general) (routine) without abnormal findings: Secondary | ICD-10-CM | POA: Diagnosis not present

## 2016-01-30 NOTE — Addendum Note (Signed)
Addended by: Thurnell Garbe A on: 01/30/2016 02:38 PM   Modules accepted: Orders, SmartSet

## 2016-01-30 NOTE — Progress Notes (Signed)
Leslie Owen 03-06-1981 [id number]   History:    35 y.o.  for annual gyn exam with history of endometriosis is scheduled to undergo surgery with Dr. Nelva Bush at the end of the month as a result of nodular area on the umbilicus highly suspicious for endometriosis contribute to her pain. Her history as follows:  Abdominal myomectomy Laparoscopy 2 for endometriosis Both of these operations were in [facility]  Patient then had endometrial ablation in [place] followed by robotic hysterectomy for fibroid uterus and endometriosis in 2015  Patient with prior history of cholecystectomy.  Patient with history of pulmonary embolism in 2016 had been on Coumadin now on no blood thinner.  Patient was seen in the emergency room on May 9 with severe umbilicus pain. And a CT scan was done which was compared with 1 several years ago and the following was reported:  IMPRESSION: 1. No acute finding. 2. Stable nodular scarring below the umbilicus. Scar endometriosis is considered given appearance and patient history, correlate for cyclical pain. 3. Stable benign splenic mass.  That ED visit her comprehensive metabolic panel and CBC were normal Patient does not recall ever having had received the HPV virus vaccine   Past medical history,surgical history, family history and social history were all reviewed and documented in the EPIC chart.  Gynecologic History No LMP recorded. Patient has had a hysterectomy. Contraception: status post hysterectomy Last Pap: Several years ago . Results were: normal Last mammogram: Not indicated . Results were: Not indicated   Obstetric History OB History  Gravida Para Term Preterm AB SAB TAB Ectopic Multiple Living  3 2 1 1      3     # Outcome Date GA Lbr Len/2nd Weight Sex Delivery Anes PTL Lv  3 Gravida           2 Preterm           1 Term                ROS: A ROS was performed and pertinent positives and negatives are included in the  history.  GENERAL: No fevers or chills. HEENT: No change in vision, no earache, sore throat or sinus congestion. NECK: No pain or stiffness. CARDIOVASCULAR: No chest pain or pressure. No palpitations. PULMONARY: No shortness of breath, cough or wheeze. GASTROINTESTINAL: No abdominal pain, nausea, vomiting or diarrhea, melena or bright red blood per rectum. GENITOURINARY: No urinary frequency, urgency, hesitancy or dysuria. MUSCULOSKELETAL: No joint or muscle pain, no back pain, no recent trauma. DERMATOLOGIC: No rash, no itching, no lesions. ENDOCRINE: No polyuria, polydipsia, no heat or cold intolerance. No recent change in weight. HEMATOLOGICAL: No anemia or easy bruising or bleeding. NEUROLOGIC: No headache, seizures, numbness, tingling or weakness. PSYCHIATRIC: No depression, no loss of interest in normal activity or change in sleep pattern.     Exam: chaperone present  BP 120/84 mmHg  Ht 5\' 3"  (1.6 m)  Wt 167 lb (75.751 kg)  BMI 29.59 kg/m2  Body mass index is 29.59 kg/(m^2).  General appearance : Well developed well nourished female. No acute distress HEENT: Eyes: no retinal hemorrhage or exudates,  Neck supple, trachea midline, no carotid bruits, no thyroidmegaly Lungs: Clear to auscultation, no rhonchi or wheezes, or rib retractions  Heart: Regular rate and rhythm, no murmurs or gallops Breast:Examined in sitting and supine position were symmetrical in appearance, no palpable masses or tenderness,  no skin retraction, no nipple inversion, no nipple discharge, no skin discoloration,  no axillary or supraclavicular lymphadenopathy Abdomen:  tender nodularity umbilicus: no edema or skin discoloration or tenderness  Pelvic:  Bartholin, Urethra, Skene Glands: Within normal limits             Vagina: No gross lesions or discharge  CervixAbsent   Uterus absent   Adnexa  Without masses or tenderness  Anus and perineum  normal   Rectovaginal  normal sphincter tone without palpated masses or  tenderness             Hemoccult not indicated    Assessment/Plan:  35 y.o. female for annual exam with history of endometriosis and possible recurrence at the area of the umbilicus scheduled to excise nodular endometrioma of the umbilicus at the end of the month. She'll return back to the office next week in a fasting state for the following screening blood work: CBC, comprehensive metabolic panel, fasting lipid profile, TSH, and urinalysis. Pap smear was done today so that we can have records and then we will adhere to the guidelines.   Terrance Mass MD, 12:59 PM 01/30/2016

## 2016-01-31 LAB — URINALYSIS W MICROSCOPIC + REFLEX CULTURE
BACTERIA UA: NONE SEEN [HPF]
BILIRUBIN URINE: NEGATIVE
CRYSTALS: NONE SEEN [HPF]
Casts: NONE SEEN [LPF]
GLUCOSE, UA: NEGATIVE
Ketones, ur: NEGATIVE
Leukocytes, UA: NEGATIVE
Nitrite: NEGATIVE
PROTEIN: NEGATIVE
Specific Gravity, Urine: 1.022 (ref 1.001–1.035)
WBC UA: NONE SEEN WBC/HPF (ref <=5)
YEAST: NONE SEEN [HPF]
pH: 6 (ref 5.0–8.0)

## 2016-02-01 LAB — URINE CULTURE

## 2016-02-01 IMAGING — CR DG CHEST 2V
2 series · 2 of 2 positions shown · non-contrast
Comparison: 12/09/2009 and earlier.

CLINICAL DATA: 33-year-old female with pain beneath the left breast
since last night. Chest pain. Initial encounter.

EXAM:
CHEST  2 VIEW

[w chest pa]
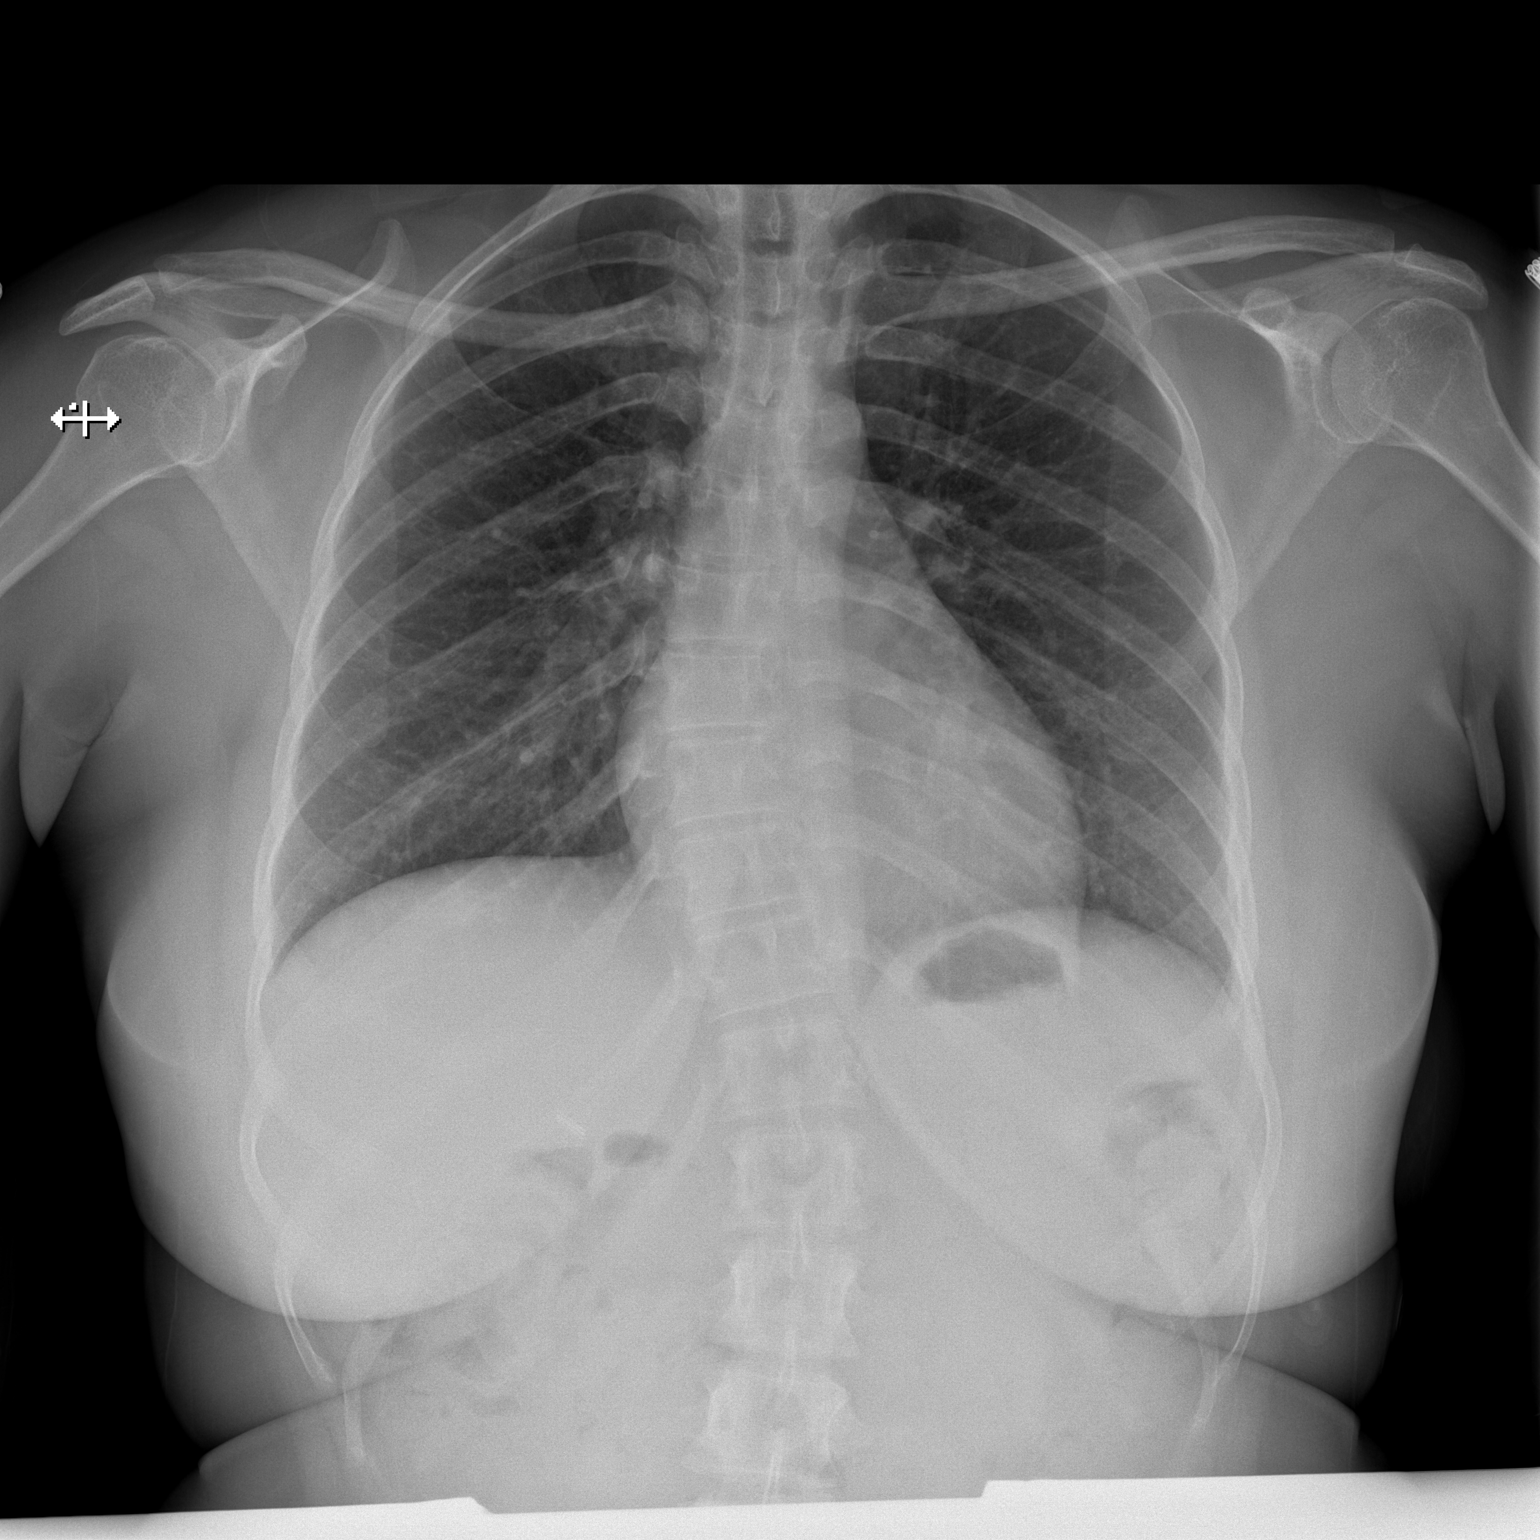

[w chest lat]
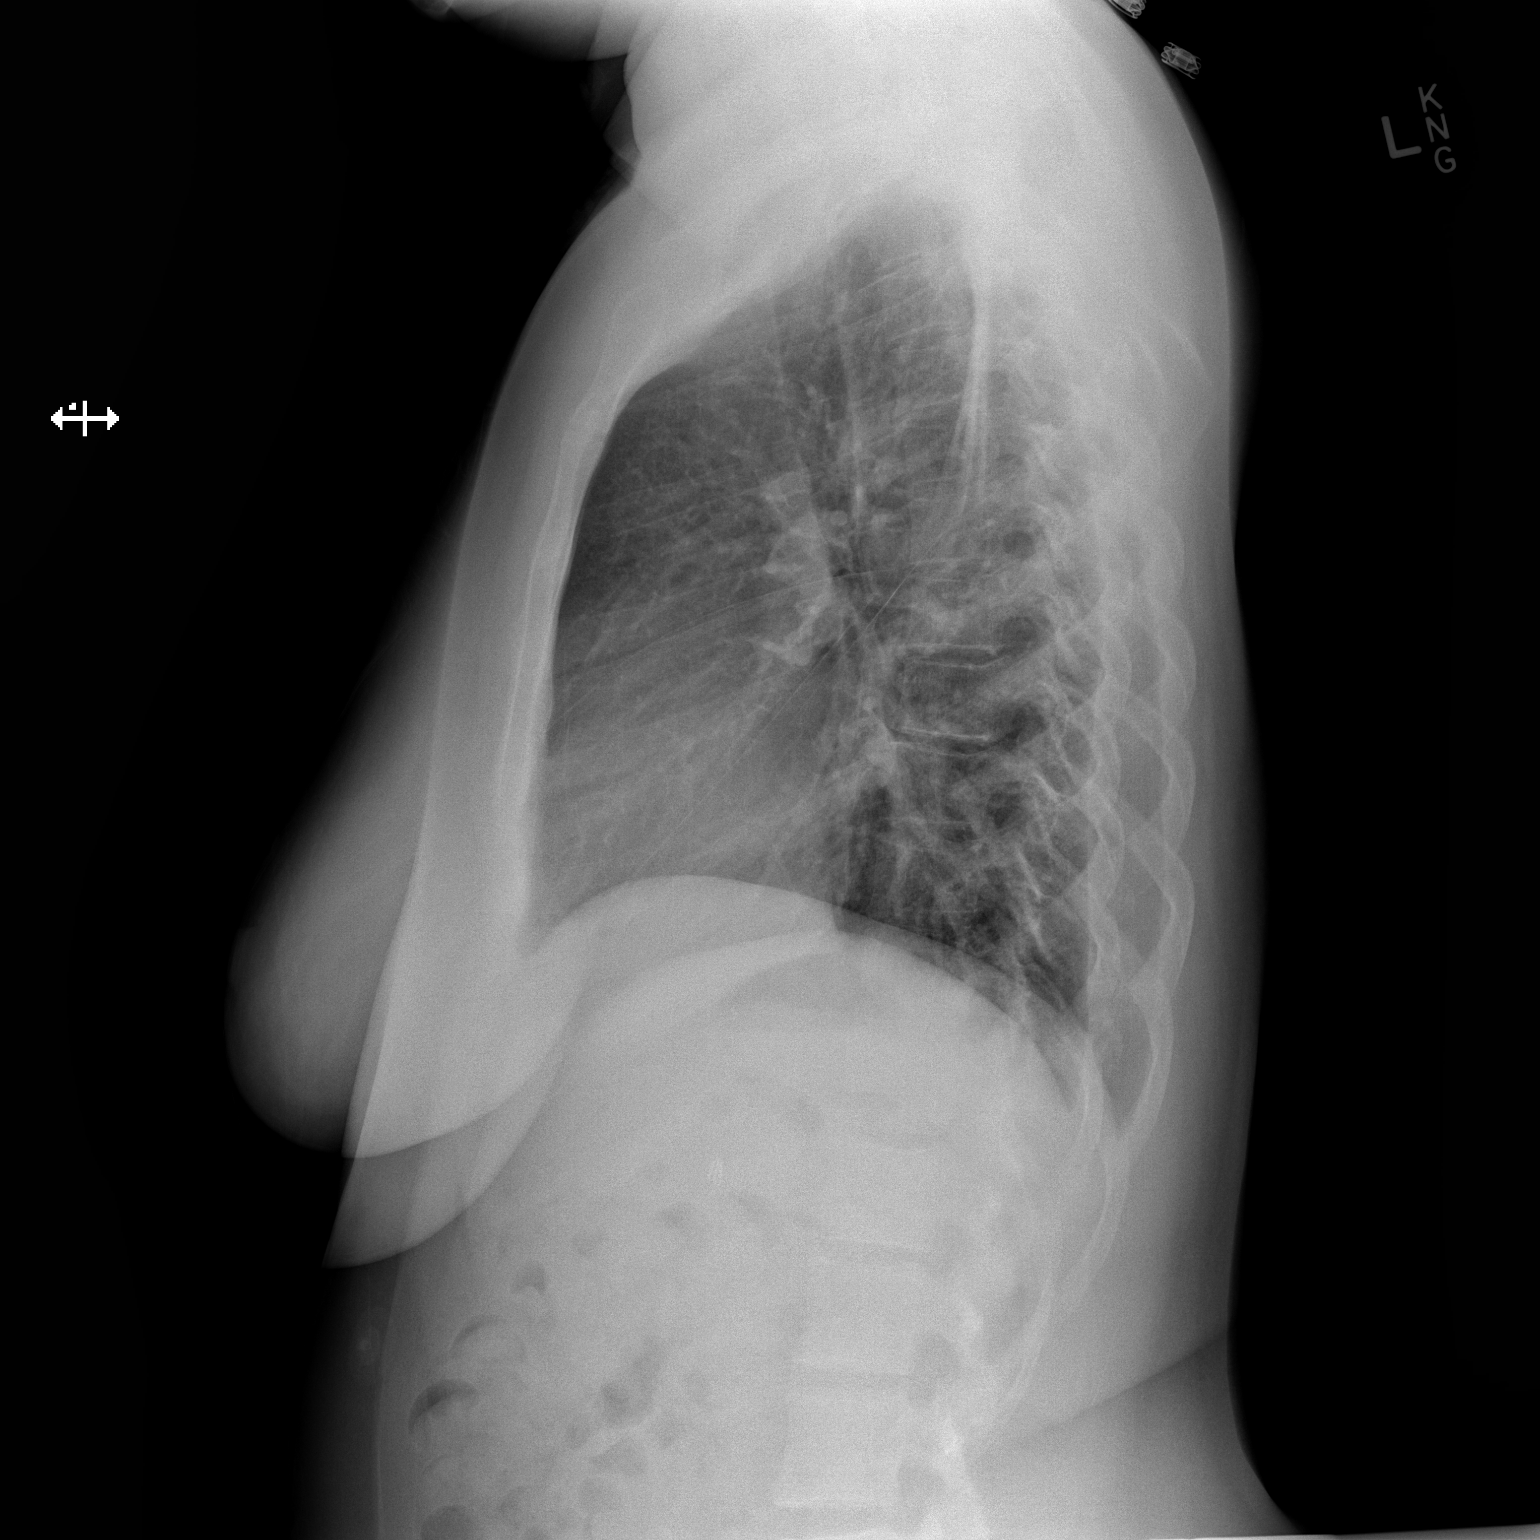

[2 of 2 positions shown; findings below may reference images not displayed]

FINDINGS: Stable scoliosis. Normal cardiac size and mediastinal contours.
Visualized tracheal air column is within normal limits. Low normal
lung volumes. The lungs remain clear. No pneumothorax or effusion.
Stable cholecystectomy clips.
IMPRESSION: No acute cardiopulmonary abnormality.

## 2016-02-02 ENCOUNTER — Other Ambulatory Visit: Payer: Managed Care, Other (non HMO)

## 2016-02-02 LAB — CBC WITH DIFFERENTIAL/PLATELET
Basophils Absolute: 0 cells/uL (ref 0–200)
Basophils Relative: 0 %
Eosinophils Absolute: 78 cells/uL (ref 15–500)
Eosinophils Relative: 2 %
HEMATOCRIT: 38.8 % (ref 35.0–45.0)
HEMOGLOBIN: 12.8 g/dL (ref 11.7–15.5)
LYMPHS ABS: 1638 {cells}/uL (ref 850–3900)
Lymphocytes Relative: 42 %
MCH: 29.7 pg (ref 27.0–33.0)
MCHC: 33 g/dL (ref 32.0–36.0)
MCV: 90 fL (ref 80.0–100.0)
MPV: 9.5 fL (ref 7.5–12.5)
Monocytes Absolute: 351 cells/uL (ref 200–950)
Monocytes Relative: 9 %
NEUTROS ABS: 1833 {cells}/uL (ref 1500–7800)
NEUTROS PCT: 47 %
Platelets: 300 10*3/uL (ref 140–400)
RBC: 4.31 MIL/uL (ref 3.80–5.10)
RDW: 13.3 % (ref 11.0–15.0)
WBC: 3.9 10*3/uL (ref 3.8–10.8)

## 2016-02-02 LAB — LIPID PANEL
CHOLESTEROL: 150 mg/dL (ref 125–200)
HDL: 56 mg/dL (ref >=46)
LDL Cholesterol: 79 mg/dL (ref <130)
TRIGLYCERIDES: 75 mg/dL (ref <150)
Total CHOL/HDL Ratio: 2.7 Ratio (ref <=5.0)
VLDL: 15 mg/dL (ref <30)

## 2016-02-02 LAB — COMPREHENSIVE METABOLIC PANEL
ALBUMIN: 4 g/dL (ref 3.6–5.1)
ALK PHOS: 39 U/L (ref 33–115)
ALT: 10 U/L (ref 6–29)
AST: 15 U/L (ref 10–30)
BILIRUBIN TOTAL: 0.6 mg/dL (ref 0.2–1.2)
BUN: 7 mg/dL (ref 7–25)
CALCIUM: 9.2 mg/dL (ref 8.6–10.2)
CO2: 24 mmol/L (ref 20–31)
CREATININE: 0.86 mg/dL (ref 0.50–1.10)
Chloride: 106 mmol/L (ref 98–110)
Glucose, Bld: 84 mg/dL (ref 65–99)
Potassium: 4.1 mmol/L (ref 3.5–5.3)
Sodium: 138 mmol/L (ref 135–146)
TOTAL PROTEIN: 6.1 g/dL (ref 6.1–8.1)

## 2016-02-02 LAB — TSH: TSH: 1.47 mIU/L

## 2016-02-03 LAB — PAP IG W/ RFLX HPV ASCU

## 2016-02-05 IMAGING — CR DG CHEST 1V PORT
1 series · 1 of 1 positions shown · non-contrast
Comparison: 09/10/2014

CLINICAL DATA: Short of breath

EXAM:
PORTABLE CHEST - 1 VIEW

[AP]
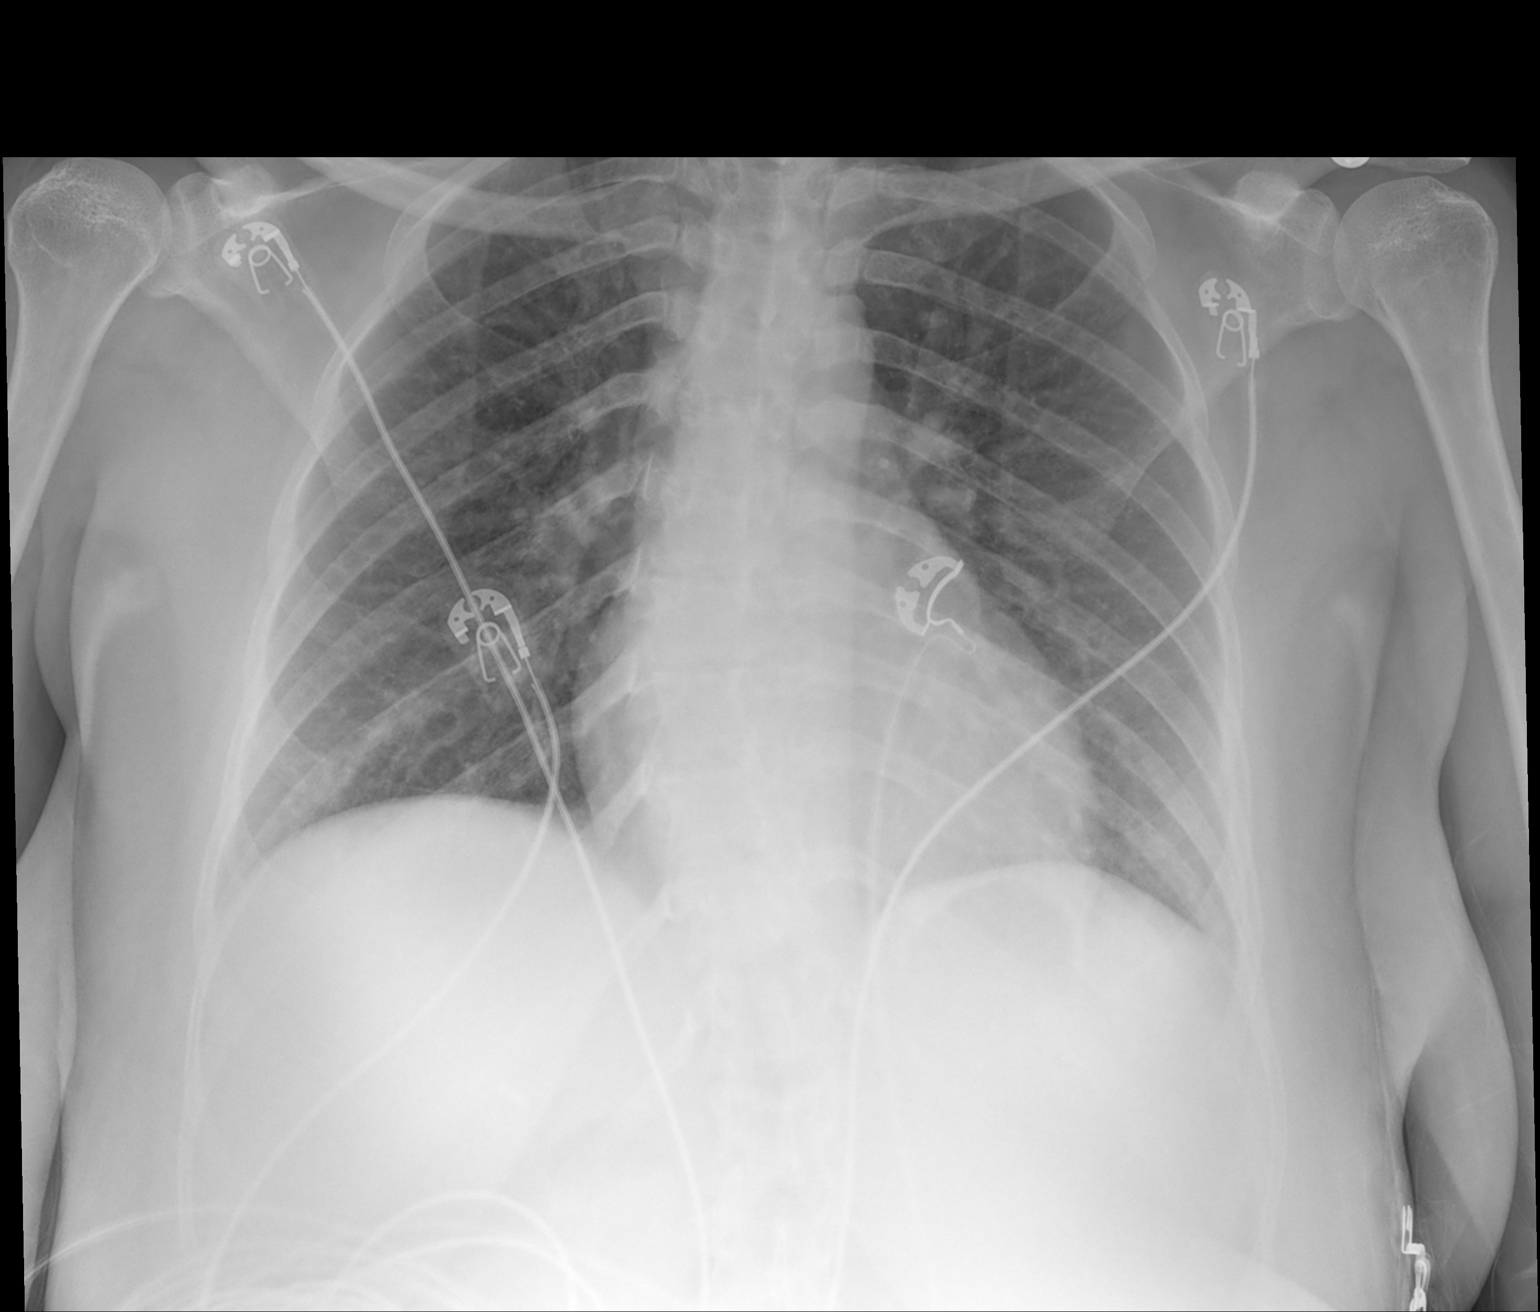

[1 of 1 positions shown; findings below may reference images not displayed]

FINDINGS: The heart size and mediastinal contours are within normal limits.
Both lungs are clear. The visualized skeletal structures are
unremarkable.
IMPRESSION: No active disease.

## 2016-02-13 ENCOUNTER — Other Ambulatory Visit: Payer: Self-pay | Admitting: General Surgery

## 2016-03-05 ENCOUNTER — Other Ambulatory Visit: Payer: Self-pay | Admitting: Gynecology

## 2016-03-05 ENCOUNTER — Ambulatory Visit
Admission: RE | Admit: 2016-03-05 | Discharge: 2016-03-05 | Disposition: A | Discharge status: Home / Self Care | Payer: Managed Care, Other (non HMO) | Source: Ambulatory Visit | Attending: Gynecology | Admitting: Gynecology

## 2016-03-05 DIAGNOSIS — N644 Mastodynia: Secondary | ICD-10-CM

## 2016-05-10 IMAGING — CR DG ABDOMEN ACUTE W/ 1V CHEST
3 series · 3 of 3 positions shown · non-contrast
Comparison: Chest x-ray 10/22/2014 as well as acute abdominal
series 10/09/2015

CLINICAL DATA: Mid abdominal pain radiating to mid back with some
nausea beginning this morning.

EXAM:
DG ABDOMEN ACUTE W/ 1V CHEST

[w chest pa]
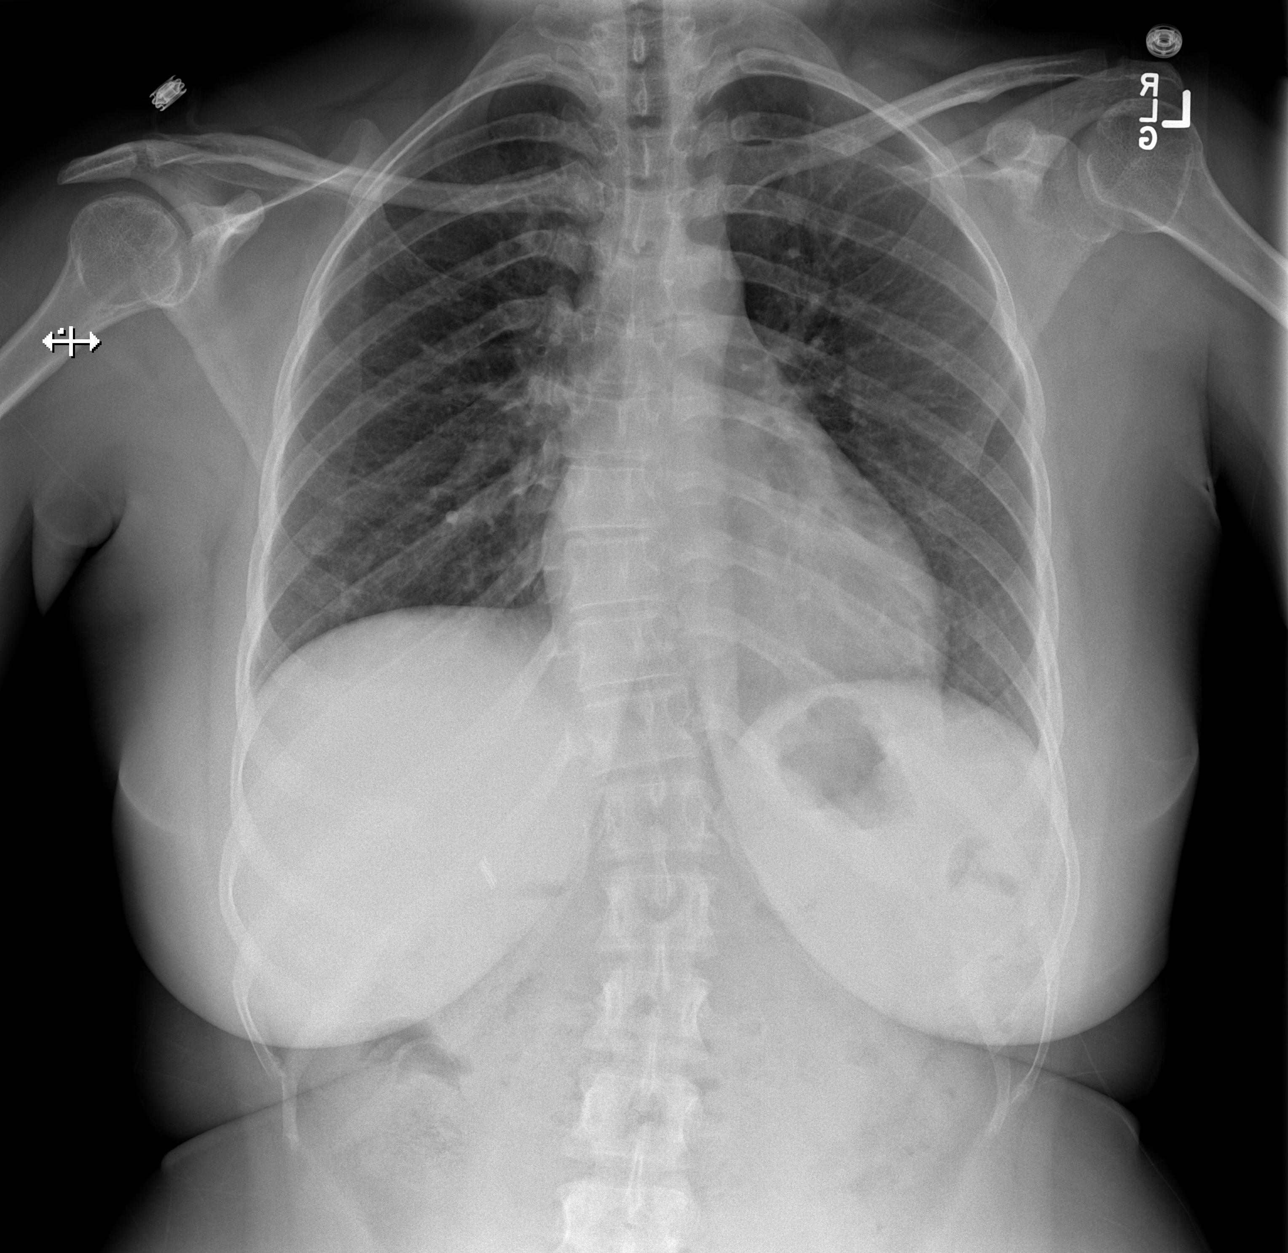

[w abdomen upright]
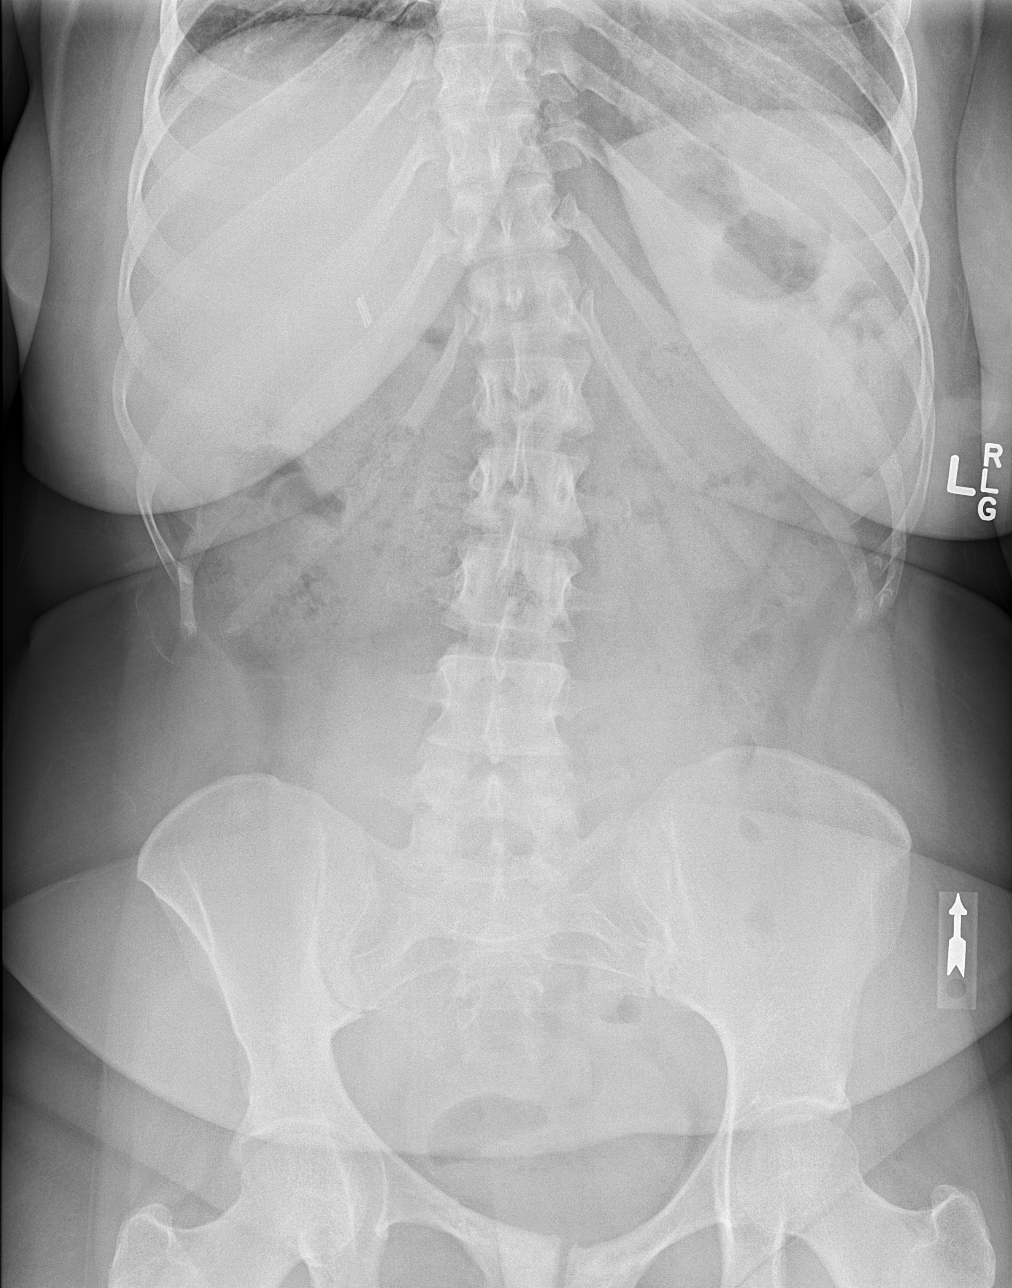

[t abdomen supine]
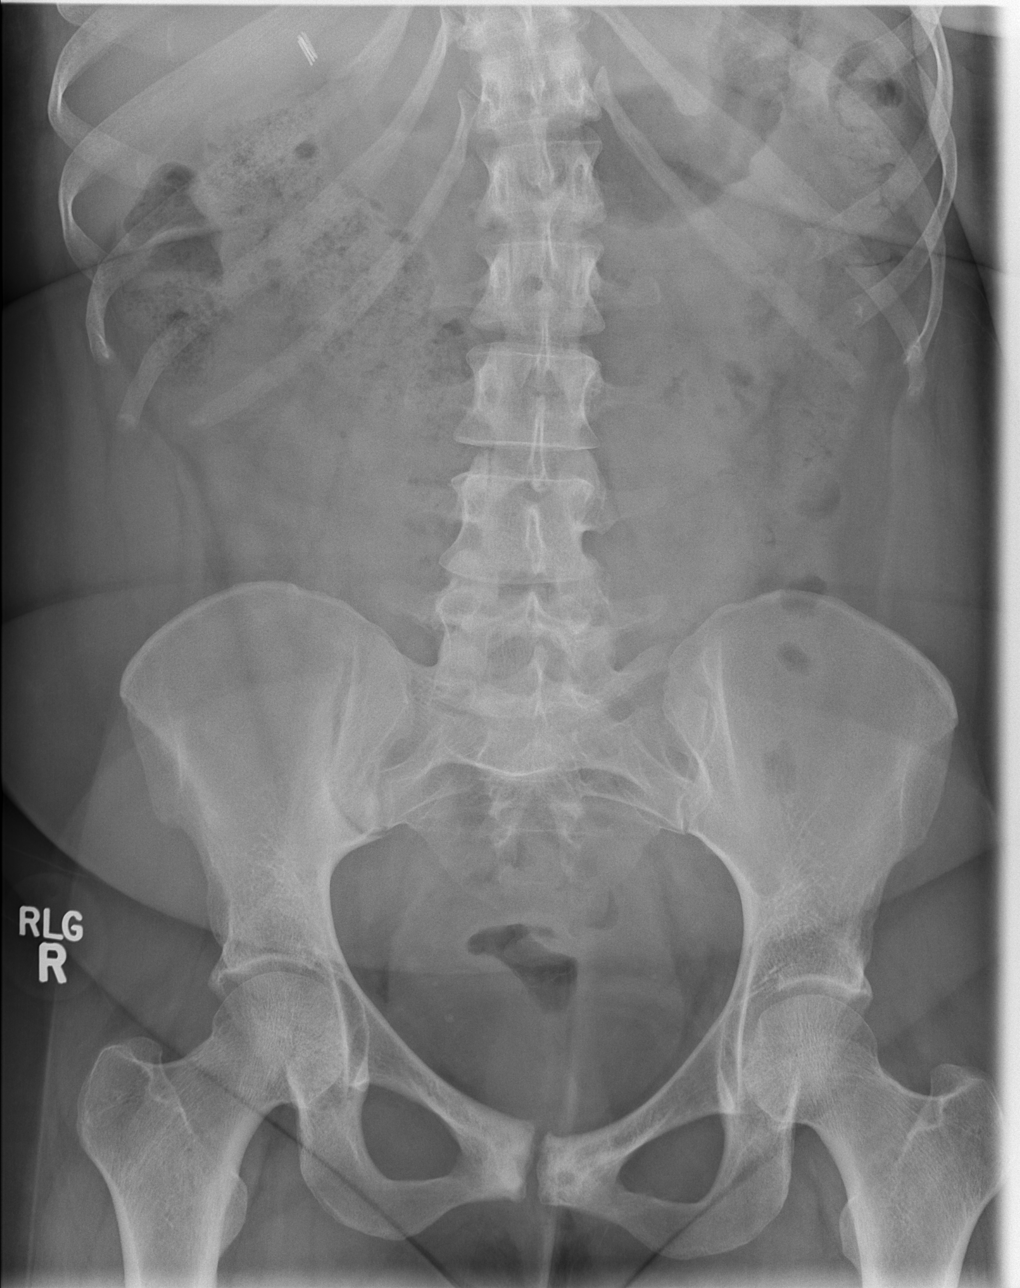

[3 of 3 positions shown; findings below may reference images not displayed]

FINDINGS: Lungs are adequately inflated without consolidation or effusion.
Cardiomediastinal silhouette and remainder of the chest is
unchanged.

Abdominal pelvic images demonstrate a nonobstructive bowel gas
pattern with minimal fecal retention over the hepatic hepatic and
splenic flexures. No free peritoneal air. Surgical clips over the
gallbladder fossa. No evidence of mass or mass effect. There is mild
biphasic curvature of the thoracolumbar spine unchanged. Mild
sclerosis over the symphysis pubis joint unchanged.
IMPRESSION: Nonobstructive bowel gas pattern.

No acute cardiopulmonary disease.

## 2016-06-21 ENCOUNTER — Emergency Department (HOSPITAL_COMMUNITY): Payer: BLUE CROSS/BLUE SHIELD

## 2016-06-21 ENCOUNTER — Emergency Department (HOSPITAL_COMMUNITY)
Admission: EM | Admit: 2016-06-21 | Discharge: 2016-06-21 | Disposition: A | Discharge status: Home / Self Care | Payer: BLUE CROSS/BLUE SHIELD | Attending: Emergency Medicine | Admitting: Emergency Medicine

## 2016-06-21 ENCOUNTER — Encounter (HOSPITAL_COMMUNITY): Payer: Self-pay | Admitting: Emergency Medicine

## 2016-06-21 DIAGNOSIS — S9032XA Contusion of left foot, initial encounter: Secondary | ICD-10-CM | POA: Diagnosis not present

## 2016-06-21 DIAGNOSIS — W208XXA Other cause of strike by thrown, projected or falling object, initial encounter: Secondary | ICD-10-CM | POA: Diagnosis not present

## 2016-06-21 DIAGNOSIS — Z79899 Other long term (current) drug therapy: Secondary | ICD-10-CM | POA: Diagnosis not present

## 2016-06-21 DIAGNOSIS — Y929 Unspecified place or not applicable: Secondary | ICD-10-CM | POA: Insufficient documentation

## 2016-06-21 DIAGNOSIS — Y939 Activity, unspecified: Secondary | ICD-10-CM | POA: Diagnosis not present

## 2016-06-21 DIAGNOSIS — Y999 Unspecified external cause status: Secondary | ICD-10-CM | POA: Diagnosis not present

## 2016-06-21 DIAGNOSIS — Z7902 Long term (current) use of antithrombotics/antiplatelets: Secondary | ICD-10-CM | POA: Diagnosis not present

## 2016-06-21 DIAGNOSIS — S99922A Unspecified injury of left foot, initial encounter: Secondary | ICD-10-CM | POA: Diagnosis present

## 2016-06-21 MED ORDER — IBUPROFEN 800 MG PO TABS
800.0000 mg | ORAL_TABLET | Freq: Once | ORAL | Status: AC
  Administered 2016-06-21: 800 mg via ORAL
  Filled 2016-06-21: qty 1

## 2016-06-21 MED ORDER — IBUPROFEN 800 MG PO TABS: 800.0000 mg | ORAL_TABLET | Freq: Once | ORAL | Status: DC | PRN

## 2016-06-21 MED ORDER — NAPROXEN 500 MG PO TABS: 500.0000 mg | ORAL_TABLET | Freq: Two times a day (BID) | ORAL | 0 refills | Status: DC

## 2016-06-21 NOTE — ED Provider Notes (Signed)
[facility] DEPT Provider Note   CSN: [id number] Arrival date & time: 06/21/16  1015  By signing my name below, I, Irene Pap, attest that this documentation has been prepared under the direction and in the presence of Domenic Moras, PA-C. Electronically Signed: Irene Pap, ED Scribe. 06/21/16. 12:31 PM.  History   Chief Complaint Chief Complaint  Patient presents with  . Foot Pain   The history is provided by the patient. No language interpreter was used.  HPI Comments: Leslie Owen is a 35 y.o. female who presents to the Emergency Department complaining of left foot pain onset 3 days ago. She reports associated swelling and bruising. Pt says that she dropped a full suitcase on her foot on 06/18/16. She was able to ambulate the next day, but has not been able to recently due to increased pain. Pt has worsening pain with weightbearing and movement. She says that the pain will shoot up her left leg. She has been taking OTC medication for the pain to no relief. She denies wound, numbness, or weakness.   Past Medical History:  Diagnosis Date  . Anemia   . Encounter for diagnostic endoscopy   . Gall bladder stones   . Pulmonary embolus ([place])   . Ulcer ([place])   . Uterine fibroids affecting pregnancy     Patient Active Problem List   Diagnosis Date Noted  . Ovarian cyst, left 02/17/2015  . Breast pain, left 01/22/2015  . Female pelvic pain 01/22/2015  . Pulmonary embolism ([place]) 10/02/2014  . FIBROIDS, UTERUS 05/30/2007  . ENDOMETRIOSIS 05/30/2007    Past Surgical History:  Procedure Laterality Date  . ABDOMINAL HYSTERECTOMY    . CESAREAN SECTION    . CHOLECYSTECTOMY    . laporoscopy    . MYOMECTOMY    . ovarian cysts    . WISDOM TOOTH EXTRACTION      OB History    Gravida Para Term Preterm AB Living   3 2 1 1   3    SAB TAB Ectopic Multiple Live Births                   Home Medications    Prior to Admission medications   Medication Sig Start Date End  Date Taking? Authorizing Provider  acetaminophen (TYLENOL) 500 MG tablet Take 1,000 mg by mouth every 6 (six) hours as needed for moderate pain. Reported on 01/30/2016    Historical Provider, MD  acetaminophen-codeine (TYLENOL #3) 300-30 MG tablet Take 1-2 tablets by mouth every 6 (six) hours as needed for moderate pain. Patient not taking: Reported on 01/02/2016 12/02/15   Domenic Moras, PA-C  HYDROcodone-acetaminophen (NORCO/VICODIN) 5-325 MG tablet Take 2 tablets by mouth every 4 (four) hours as needed. Patient not taking: Reported on 11/21/2015 06/26/15   Tanna Furry, MD  HYDROcodone-acetaminophen (NORCO/VICODIN) 5-325 MG tablet Take 1 tablet by mouth every 6 (six) hours as needed for moderate pain. 01/02/16   Terrance Mass, MD  methocarbamol (ROBAXIN) 500 MG tablet Take 1 tablet (500 mg total) by mouth 3 (three) times daily between meals as needed. Patient not taking: Reported on 11/21/2015 06/26/15   Tanna Furry, MD  rivaroxaban (XARELTO) 20 MG TABS tablet Take 1 tablet (20 mg total) by mouth daily with supper. Patient not taking: Reported on 11/21/2015 02/13/15   Truitt Merle, MD    Family History Family History  Problem Relation Age of Onset  . Heart disease Father   . Arthritis Mother   . Hypertension Mother   .  Cancer Maternal Aunt     leukemia   . Cancer Maternal Grandfather 60    lung cancer   . Cancer Maternal Aunt     breast ca  . Cancer Maternal Aunt     lung ca    Social History Social History  Substance Use Topics  . Smoking status: Never Smoker  . Smokeless tobacco: Never Used  . Alcohol use No     Allergies   Patient has no known allergies.   Review of Systems Review of Systems  Musculoskeletal: Positive for arthralgias and joint swelling.  Skin: Positive for color change. Negative for wound.  Neurological: Negative for weakness and numbness.     Physical Exam Updated Vital Signs BP 116/79 (BP Location: Right Arm)   Pulse 79   Temp 98.4 F (36.9 C) (Oral)   Resp  16   SpO2 98%   Physical Exam  Constitutional: She is oriented to person, place, and time. She appears well-developed and well-nourished. No distress.  HENT:  Head: Normocephalic and atraumatic.  Eyes: Conjunctivae are normal.  Neck: Normal range of motion. Neck supple.  Musculoskeletal: Normal range of motion.  Left foot: tenderness noted to dorsum of the midfoot with faint ecchymosis but no gross deformity noted. DP pulses palpable; brisk cap refill. Decreased dorsi- and plantarflexion secondary to pain. Normal inversion and eversion of foot.   Neurological: She is alert and oriented to person, place, and time.  Skin: Skin is warm and dry.  Psychiatric: She has a normal mood and affect. Her behavior is normal.  Nursing note and vitals reviewed.  ED Treatments / Results  DIAGNOSTIC STUDIES: Oxygen Saturation is 98% on RA, normal by my interpretation.    COORDINATION OF CARE: 12:22 PM-Discussed treatment plan which includes x-ray with pt at bedside and pt agreed to plan.    Labs (all labs ordered are listed, but only abnormal results are displayed) Labs Reviewed - No data to display  EKG  EKG Interpretation None       Radiology Dg Foot Complete Left  Result Date: 06/21/2016 CLINICAL DATA:  Injury EXAM: LEFT FOOT - COMPLETE 3+ VIEW COMPARISON:  None. FINDINGS: No acute fracture. No dislocation.  Unremarkable soft tissues. IMPRESSION: No acute bony pathology. Electronically Signed   By: Marybelle Killings M.D.   On: 06/21/2016 11:04    Procedures Procedures (including critical care time)  Medications Ordered in ED Medications  ibuprofen (ADVIL,MOTRIN) tablet 800 mg (not administered)  ibuprofen (ADVIL,MOTRIN) tablet 800 mg (800 mg Oral Given 06/21/16 1152)     Initial Impression / Assessment and Plan / ED Course  I have reviewed the triage vital signs and the nursing notes.  Pertinent labs & imaging results that were available during my care of the patient were reviewed  by me and considered in my medical decision making (see chart for details).  Clinical Course    BP 116/79 (BP Location: Right Arm)   Pulse 79   Temp 98.4 F (36.9 C) (Oral)   Resp 16   SpO2 98%   Patient X-Ray negative for obvious fracture or dislocation. Pain managed in ED. Pt advised to follow up with orthopedics if symptoms persist. Patient given brace while in ED, conservative therapy recommended and discussed. Patient will be dc home & is agreeable with above plan.  Final Clinical Impressions(s) / ED Diagnoses   Final diagnoses:  Contusion of left foot, initial encounter   I personally performed the services described in this documentation, which was  scribed in my presence. The recorded information has been reviewed and is accurate.     New Prescriptions New Prescriptions   NAPROXEN (NAPROSYN) 500 MG TABLET    Take 1 tablet (500 mg total) by mouth 2 (two) times daily.     Domenic Moras, PA-C 06/21/16 [place], MD 06/22/16 [phone]

## 2016-06-21 NOTE — ED Notes (Signed)
Bed: WTR6 Expected date:  Expected time:  Means of arrival:  Comments: 

## 2016-06-21 NOTE — ED Triage Notes (Signed)
Pt c/o L foot pain, swelling, and bruising after dropping a full suitcase on her foot on Friday night. Pain worse with weightbearing and movement. Pt sts pain shoots up her L leg. A&Ox4. Pt has full ROM with pain.

## 2016-08-04 ENCOUNTER — Other Ambulatory Visit: Payer: Self-pay | Admitting: Gynecology

## 2016-08-04 DIAGNOSIS — N63 Unspecified lump in unspecified breast: Secondary | ICD-10-CM

## 2016-09-01 ENCOUNTER — Ambulatory Visit
Admission: RE | Admit: 2016-09-01 | Discharge: 2016-09-01 | Disposition: A | Discharge status: Home / Self Care | Payer: BLUE CROSS/BLUE SHIELD | Source: Ambulatory Visit | Attending: Gynecology | Admitting: Gynecology

## 2016-09-01 DIAGNOSIS — N63 Unspecified lump in unspecified breast: Secondary | ICD-10-CM

## 2016-12-08 ENCOUNTER — Encounter: Payer: Self-pay | Admitting: Gynecology

## 2017-02-12 ENCOUNTER — Encounter (HOSPITAL_COMMUNITY): Payer: Self-pay

## 2017-02-12 ENCOUNTER — Emergency Department (HOSPITAL_COMMUNITY)
Admission: EM | Admit: 2017-02-12 | Discharge: 2017-02-12 | Disposition: A | Discharge status: Home / Self Care | Payer: BLUE CROSS/BLUE SHIELD | Attending: Emergency Medicine | Admitting: Emergency Medicine

## 2017-02-12 ENCOUNTER — Emergency Department (HOSPITAL_BASED_OUTPATIENT_CLINIC_OR_DEPARTMENT_OTHER)
Admit: 2017-02-12 | Discharge: 2017-02-12 | Disposition: A | Discharge status: Home / Self Care | Payer: BLUE CROSS/BLUE SHIELD

## 2017-02-12 DIAGNOSIS — M79609 Pain in unspecified limb: Secondary | ICD-10-CM

## 2017-02-12 DIAGNOSIS — G5602 Carpal tunnel syndrome, left upper limb: Secondary | ICD-10-CM | POA: Diagnosis not present

## 2017-02-12 DIAGNOSIS — M79602 Pain in left arm: Secondary | ICD-10-CM

## 2017-02-12 DIAGNOSIS — M25532 Pain in left wrist: Secondary | ICD-10-CM | POA: Diagnosis present

## 2017-02-12 NOTE — ED Provider Notes (Signed)
[facility] DEPT Provider Note   CSN: [id number] Arrival date & time: 02/12/17  1744     History   Chief Complaint Chief Complaint  Patient presents with  . Arm Pain    HPI Leslie Owen is a 36 y.o. female presenting with sudden onset left arm pain with associated swelling and a small lump proximally which started Thursday at work. She reports having to do "thumbs up" at work and felt a strange shooting pain up her forearm and burning sensation. Yesterday she tried to pick up her radio and it felt too heavy and she was unable to hold it due to pain and it dropped out of her hand. She has a history of PE of unknown origin for which she was on anticoagulants for 6 months a year ago. She denies anticoagulant use at this time. Denies trauma or injury, prolonged immobilizations, recent surgeries, malignancy, estrogen use, hemoptysis, C/P, SOB or other symptoms.  HPI  Past Medical History:  Diagnosis Date  . Anemia   . Encounter for diagnostic endoscopy   . Gall bladder stones   . Pulmonary embolus ([place])   . Ulcer   . Uterine fibroids affecting pregnancy     Patient Active Problem List   Diagnosis Date Noted  . Ovarian cyst, left 02/17/2015  . Breast pain, left 01/22/2015  . Female pelvic pain 01/22/2015  . Pulmonary embolism ([place]) 10/02/2014  . FIBROIDS, UTERUS 05/30/2007  . ENDOMETRIOSIS 05/30/2007    Past Surgical History:  Procedure Laterality Date  . ABDOMINAL HYSTERECTOMY    . CESAREAN SECTION    . CHOLECYSTECTOMY    . laporoscopy    . MYOMECTOMY    . ovarian cysts    . WISDOM TOOTH EXTRACTION      OB History    Gravida Para Term Preterm AB Living   3 2 1 1   3    SAB TAB Ectopic Multiple Live Births                   Home Medications    Prior to Admission medications   Medication Sig Start Date End Date Taking? Authorizing Provider  naproxen (NAPROSYN) 500 MG tablet Take 1 tablet (500 mg total) by mouth 2 (two) times daily. Patient not taking:  Reported on 02/12/2017 06/21/16   Domenic Moras, PA-C    Family History Family History  Problem Relation Age of Onset  . Heart disease Father   . Arthritis Mother   . Hypertension Mother   . Cancer Maternal Aunt        leukemia   . Cancer Maternal Grandfather 60       lung cancer   . Cancer Maternal Aunt        breast ca  . Cancer Maternal Aunt        lung ca    Social History Social History  Substance Use Topics  . Smoking status: Never Smoker  . Smokeless tobacco: Never Used  . Alcohol use No     Allergies   Patient has no known allergies.   Review of Systems Review of Systems  Constitutional: Negative for chills and fever.  HENT: Negative for congestion, ear pain and sore throat.   Eyes: Negative for pain and visual disturbance.  Respiratory: Negative for cough, choking, chest tightness, shortness of breath, wheezing and stridor.   Cardiovascular: Negative for chest pain, palpitations and leg swelling.  Gastrointestinal: Negative for abdominal distention, abdominal pain, nausea and vomiting.  Genitourinary: Negative for dysuria  and hematuria.  Musculoskeletal: Positive for myalgias. Negative for arthralgias, back pain and joint swelling.  Skin: Negative for color change, pallor, rash and wound.  Neurological: Positive for weakness. Negative for dizziness, seizures, syncope, light-headedness and numbness.     Physical Exam Updated Vital Signs BP 99/69 (BP Location: Right Arm)   Pulse 80   Temp 98.1 F (36.7 C) (Oral)   Resp 18   SpO2 99%   Physical Exam  Constitutional: She appears well-developed and well-nourished. No distress.  Afebrile, non-toxic appearing, sitting comfortably in chair in no acute distress.  HENT:  Head: Normocephalic and atraumatic.  Eyes: Conjunctivae are normal.  Neck: Neck supple.  Cardiovascular: Normal rate, regular rhythm, normal heart sounds and intact distal pulses.   No murmur heard. Strong radial pulses  Pulmonary/Chest:  Effort normal and breath sounds normal. No respiratory distress. She has no wheezes. She has no rales. She exhibits no tenderness.  Abdominal: Soft. There is no tenderness.  Musculoskeletal: Normal range of motion. She exhibits edema and tenderness. She exhibits no deformity.  TTP over entire left forearm and dorsum of the hand especially thumb and index finger. Small mass appreciated on dorsal aspect of left forarm just proximal to swelling and pain. No lesions, wound, erythema or warmth. Full rom and 5/5 strength in digits and grips  Neurological: She is alert. No sensory deficit.  Skin: Skin is warm and dry. No rash noted. She is not diaphoretic. No erythema. No pallor.  Psychiatric: She has a normal mood and affect.  Nursing note and vitals reviewed.    ED Treatments / Results  Labs (all labs ordered are listed, but only abnormal results are displayed) Labs Reviewed - No data to display  EKG  EKG Interpretation None       Radiology No results found.  Procedures Procedures (including critical care time)  Medications Ordered in ED Medications - No data to display   Initial Impression / Assessment and Plan / ED Course  I have reviewed the triage vital signs and the nursing notes.  Pertinent labs & imaging results that were available during my care of the patient were reviewed by me and considered in my medical decision making (see chart for details).    Patient presents with Left forearm swelling and pain with hx of PE.  Ordered DVT US  Patient otherwise well-appearing and no complaints.  [place] negative for DVT. Patient had positive tinel and phalen test. Provided with wrist brace. Some mild swelling proximal to the left wrist without erythema. I do not suspect cellulitis at this time. Advised patient to monitor closely for erythema or any worsening and return. Patient is afebrile, non-toxic and well-appearing.  Will discharge home with symptomatic relief for carpal  tunnel syndrome and close follow up with PCP.   Discussed strict return precautions and advised to return to the emergency department if experiencing any new or worsening symptoms. Instructions were understood and patient agreed with discharge plan.  Final Clinical Impressions(s) / ED Diagnoses   Final diagnoses:  Left arm pain  Carpal tunnel syndrome of left wrist    New Prescriptions Discharge Medication List as of 02/12/2017 11:05 PM       Emeline General, PA-C 02/13/17 1324    Virgel Manifold, MD 02/20/17 1624

## 2017-02-12 NOTE — Discharge Instructions (Signed)
As discussed, wear your wrist guard at night and at work. Ibuprofen for pain and swelling.  Follow up with your primary care provider as needed. Return to be seen if worsening, redness, swelling, pain, fever or other new concerning symptoms in the meantime.

## 2017-02-12 NOTE — ED Triage Notes (Signed)
Per pt, started having pain in left posterior forearm and wrist.  Swelling noted.  No injury. Started 2 days ago.

## 2017-02-12 NOTE — ED Notes (Signed)
Vascular paged for room 8 at 18:49.

## 2017-02-12 NOTE — Progress Notes (Signed)
*  PRELIMINARY RESULTS* Vascular Ultrasound Left upper extremity venous duplex has been completed.  Preliminary findings: No evidence of DVT or superficial thrombosis.  Landry Mellow, RDMS, RVT  02/12/2017, 7:28 PM

## 2017-02-12 NOTE — ED Notes (Signed)
Vas called @ 18:48 and will be here asap coming from [facility].

## 2017-02-14 ENCOUNTER — Other Ambulatory Visit: Payer: Self-pay | Admitting: Gynecology

## 2017-02-14 DIAGNOSIS — N6310 Unspecified lump in the right breast, unspecified quadrant: Secondary | ICD-10-CM

## 2017-03-14 ENCOUNTER — Other Ambulatory Visit: Payer: Self-pay | Admitting: Gynecology

## 2017-03-14 DIAGNOSIS — N6310 Unspecified lump in the right breast, unspecified quadrant: Secondary | ICD-10-CM

## 2017-03-16 ENCOUNTER — Ambulatory Visit
Admission: RE | Admit: 2017-03-16 | Discharge: 2017-03-16 | Disposition: A | Discharge status: Home / Self Care | Payer: BLUE CROSS/BLUE SHIELD | Source: Ambulatory Visit | Attending: Gynecology | Admitting: Gynecology

## 2017-03-16 DIAGNOSIS — N6310 Unspecified lump in the right breast, unspecified quadrant: Secondary | ICD-10-CM

## 2018-03-10 ENCOUNTER — Other Ambulatory Visit: Payer: Self-pay | Admitting: Gynecology

## 2018-03-10 DIAGNOSIS — Z1231 Encounter for screening mammogram for malignant neoplasm of breast: Secondary | ICD-10-CM

## 2018-03-14 ENCOUNTER — Other Ambulatory Visit: Payer: Self-pay | Admitting: Gynecology

## 2018-03-14 DIAGNOSIS — N6310 Unspecified lump in the right breast, unspecified quadrant: Secondary | ICD-10-CM

## 2018-03-14 DIAGNOSIS — N63 Unspecified lump in unspecified breast: Secondary | ICD-10-CM

## 2018-03-16 ENCOUNTER — Ambulatory Visit
Admission: RE | Admit: 2018-03-16 | Discharge: 2018-03-16 | Disposition: A | Discharge status: Home / Self Care | Payer: 59 | Source: Ambulatory Visit | Attending: Gynecology | Admitting: Gynecology

## 2018-03-16 ENCOUNTER — Ambulatory Visit
Admission: RE | Admit: 2018-03-16 | Discharge: 2018-03-16 | Disposition: A | Discharge status: Home / Self Care | Payer: BLUE CROSS/BLUE SHIELD | Source: Ambulatory Visit | Attending: Gynecology | Admitting: Gynecology

## 2018-03-16 DIAGNOSIS — N6310 Unspecified lump in the right breast, unspecified quadrant: Secondary | ICD-10-CM

## 2018-03-16 DIAGNOSIS — N63 Unspecified lump in unspecified breast: Secondary | ICD-10-CM

## 2019-04-17 ENCOUNTER — Encounter: Payer: Self-pay | Admitting: Gynecology

## 2019-05-01 ENCOUNTER — Other Ambulatory Visit: Payer: Self-pay

## 2019-05-01 DIAGNOSIS — Z20822 Contact with and (suspected) exposure to covid-19: Secondary | ICD-10-CM

## 2019-05-03 LAB — NOVEL CORONAVIRUS, NAA: SARS-CoV-2, NAA: NOT DETECTED

## 2019-07-06 ENCOUNTER — Other Ambulatory Visit: Payer: Self-pay

## 2019-07-06 DIAGNOSIS — Z20822 Contact with and (suspected) exposure to covid-19: Secondary | ICD-10-CM

## 2019-07-07 LAB — NOVEL CORONAVIRUS, NAA: SARS-CoV-2, NAA: NOT DETECTED

## 2020-04-11 ENCOUNTER — Emergency Department (INDEPENDENT_AMBULATORY_CARE_PROVIDER_SITE_OTHER)
Admission: EM | Admit: 2020-04-11 | Discharge: 2020-04-11 | Disposition: A | Discharge status: Home / Self Care | Payer: BC Managed Care – PPO | Source: Home / Self Care | Attending: Family Medicine | Admitting: Family Medicine

## 2020-04-11 ENCOUNTER — Emergency Department: Payer: BC Managed Care – PPO

## 2020-04-11 ENCOUNTER — Encounter: Payer: Self-pay | Admitting: Emergency Medicine

## 2020-04-11 ENCOUNTER — Other Ambulatory Visit: Payer: Self-pay

## 2020-04-11 DIAGNOSIS — M79605 Pain in left leg: Secondary | ICD-10-CM

## 2020-04-11 HISTORY — DX: Other pulmonary embolism without acute cor pulmonale: I26.99

## 2020-04-11 MED ORDER — PREDNISONE 20 MG PO TABS: ORAL_TABLET | ORAL | 0 refills | Status: DC

## 2020-04-11 NOTE — ED Provider Notes (Signed)
[facility]    CSN: [id number] Arrival date & time: 04/11/20  1045      History   Chief Complaint Chief Complaint  Patient presents with  . Leg Pain  . Leg Swelling    HPI Leslie Owen is a 39 y.o. female.   Patient states that she awoke yesterday with pain and a sensation of numbness in the anterior aspect of her entire left leg from her foot to her left upper thigh.  She believes that she has had mild swelling also.  Her pain is exacerbated by standing/walking.  She denies pain/swelling in her right leg.  She denies chest pain or shortness of breath.  She recalls no injury to her leg and no increase in physical/athletic activities.  She feels well otherwise. She has a past history of PE about four years ago, but she recalls that she had significant shortness of breath at that time.  She presently does not take aspirin or an anticoagulant.  The history is provided by the patient.  Leg Pain Location:  Leg Time since incident:  1 day Injury: no   Leg location:  L upper leg and L lower leg Pain details:    Quality: numbness.   Radiates to:  Does not radiate   Severity:  Moderate   Onset quality:  Sudden   Duration:  1 day   Timing:  Constant   Progression:  Unchanged Chronicity:  New Prior injury to area:  No Relieved by:  None tried Worsened by:  Bearing weight and activity Ineffective treatments:  None tried Associated symptoms: decreased ROM, numbness, stiffness and swelling   Associated symptoms: no back pain, no fatigue, no fever, no muscle weakness and no tingling     Past Medical History:  Diagnosis Date  . Pulmonary emboli (HCC)     There are no problems to display for this patient.   Past Surgical History:  Procedure Laterality Date  . ABDOMINAL HYSTERECTOMY    . ABLATION    . CHOLECYSTECTOMY    . DIAGNOSTIC LAPAROSCOPY    . MYOMECTOMY      OB History   No obstetric history on file.      Home Medications    Prior to  Admission medications   Medication Sig Start Date End Date Taking? Authorizing Provider  predniSONE (DELTASONE) 20 MG tablet Take one tab by mouth twice daily for 4 days, then one daily for 3 days. Take with food. 04/11/20   Kandra Nicolas, MD    Family History Family History  Problem Relation Age of Onset  . Hypertension Mother   . Thyroid disease Mother   . Cancer Father   . Healthy Brother     Social History Social History   Tobacco Use  . Smoking status: Never Smoker  . Smokeless tobacco: Never Used  Vaping Use  . Vaping Use: Never used  Substance Use Topics  . Alcohol use: Not Currently  . Drug use: Never     Allergies   Patient has no known allergies.   Review of Systems Review of Systems  Constitutional: Negative for chills, diaphoresis, fatigue and fever.  Respiratory: Negative for cough, chest tightness, shortness of breath, wheezing and stridor.   Cardiovascular: Positive for leg swelling. Negative for chest pain and palpitations.  Musculoskeletal: Positive for stiffness. Negative for back pain.  Skin: Negative for color change and rash.  All other systems reviewed and are negative.    Physical Exam Triage Vital Signs  ED Triage Vitals  Enc Vitals Group     BP 04/11/20 1155 115/80     Pulse Rate 04/11/20 1155 76     Resp 04/11/20 1155 17     Temp 04/11/20 1155 98.8 F (37.1 C)     Temp Source 04/11/20 1155 Oral     SpO2 04/11/20 1155 98 %     Weight 04/11/20 1157 170 lb (77.1 kg)     Height 04/11/20 1157 5\' 3"  (1.6 m)     Head Circumference --      Peak Flow --      Pain Score 04/11/20 1157 6     Pain Loc --      Pain Edu? --      Excl. in [place]? --    No data found.  Updated Vital Signs BP 115/80 (BP Location: Left Arm)   Pulse 76   Temp 98.8 F (37.1 C) (Oral)   Resp 17   Ht 5\' 3"  (1.6 m)   Wt 77.1 kg   SpO2 98%   BMI 30.11 kg/m   Visual Acuity Right Eye Distance:   Left Eye Distance:   Bilateral Distance:    Right Eye Near:     Left Eye Near:    Bilateral Near:     Physical Exam Vitals and nursing note reviewed.  Constitutional:      General: She is not in acute distress. HENT:     Head: Normocephalic.     Right Ear: External ear normal.     Left Ear: External ear normal.     Nose: Nose normal.     Mouth/Throat:     Pharynx: Oropharynx is clear.  Eyes:     Conjunctiva/sclera: Conjunctivae normal.     Pupils: Pupils are equal, round, and reactive to light.  Cardiovascular:     Rate and Rhythm: Normal rate.     Pulses: Normal pulses.     Heart sounds: Normal heart sounds.  Pulmonary:     Effort: No respiratory distress.     Breath sounds: Normal breath sounds.  Abdominal:     Palpations: Abdomen is soft.     Tenderness: There is no abdominal tenderness.  Musculoskeletal:     Cervical back: Neck supple.     Right lower leg: No edema.     Left lower leg: No edema.       Legs:     Comments: Left leg has vague mild tenderness to palpation over anterior thigh and over anterior compartments of lower leg.  There is no warmth or erythema. No tenderness to palpation of left calf or posterior thigh  Lymphadenopathy:     Cervical: No cervical adenopathy.  Skin:    General: Skin is warm and dry.     Findings: No rash.  Neurological:     General: No focal deficit present.     Mental Status: She is alert and oriented to person, place, and time.      UC Treatments / Results  Labs (all labs ordered are listed, but only abnormal results are displayed) Labs Reviewed  CK  CBC WITH DIFFERENTIAL/PLATELET  COMPLETE METABOLIC PANEL WITH GFR    EKG   Radiology US Venous Img Lower Unilateral Left  Result Date: 04/11/2020 CLINICAL DATA:  Left leg pain.  History of pulmonary embolism. EXAM: Left LOWER EXTREMITY VENOUS DOPPLER ULTRASOUND TECHNIQUE: Gray-scale sonography with compression, as well as color and duplex ultrasound, were performed to evaluate the deep venous system(s) from the  level of the  common femoral vein through the popliteal and proximal calf veins. COMPARISON:  None. FINDINGS: VENOUS Normal compressibility of the common femoral, superficial femoral, and popliteal veins, as well as the visualized calf veins. Visualized portions of profunda femoral vein and great saphenous vein unremarkable. No filling defects to suggest DVT on grayscale or color Doppler imaging. Doppler waveforms show normal direction of venous flow, normal respiratory plasticity and response to augmentation. Limited views of the contralateral common femoral vein are unremarkable. OTHER None. Limitations: none IMPRESSION: Negative. Electronically Signed   By: Franchot Gallo M.D.   On: 04/11/2020 14:56    Procedures Procedures (including critical care time)  Medications Ordered in UC Medications - No data to display  Initial Impression / Assessment and Plan / UC Course  I have reviewed the triage vital signs and the nursing notes.  Pertinent labs & imaging results that were available during my care of the patient were reviewed by me and considered in my medical decision making (see chart for details).    Negative venous US for DVT left leg reassuring. Left leg muscle pain/tenderness ?etiology.  Check CK, CBC, CMP.  Begin prednisone burst/taper. Followup with Family Doctor as soon as possible.     Final Clinical Impressions(s) / UC Diagnoses   Final diagnoses:  Left leg pain     Discharge Instructions     May take Tylenol as needed for pain.  Increase fluid intake.  Minimize activity.  If symptoms become significantly worse during the night or over the weekend, proceed to the local emergency room.     ED Prescriptions    Medication Sig Dispense Auth. Provider   predniSONE (DELTASONE) 20 MG tablet Take one tab by mouth twice daily for 4 days, then one daily for 3 days. Take with food. 11 tablet Kandra Nicolas, MD        Kandra Nicolas, MD 04/12/20 [phone]

## 2020-04-11 NOTE — Discharge Instructions (Signed)
May take Tylenol as needed for pain.  Increase fluid intake.  Minimize activity.  If symptoms become significantly worse during the night or over the weekend, proceed to the local emergency room.

## 2020-04-11 NOTE — ED Triage Notes (Addendum)
Trace edema noted to ankles- pt c/o shooting pain to left lower extremity w/ swelling to left knee, started in foot and goes up to knee Pain increases w/ standing or ambulating Denies pain to Right leg Pain started yesterday morning Hx of PE approx 4 years ago- SOB at that time, none currently No OTC meds for leg pain  No blood thinners or ASA COVID vaccine [employer]

## 2020-04-12 LAB — CBC WITH DIFFERENTIAL/PLATELET
Absolute Monocytes: 314 cells/uL (ref 200–950)
Basophils Absolute: 20 cells/uL (ref 0–200)
Basophils Relative: 0.4 %
Eosinophils Absolute: 59 cells/uL (ref 15–500)
Eosinophils Relative: 1.2 %
HCT: 41.1 % (ref 35.0–45.0)
Hemoglobin: 13.7 g/dL (ref 11.7–15.5)
Lymphs Abs: 1970 cells/uL (ref 850–3900)
MCH: 30.6 pg (ref 27.0–33.0)
MCHC: 33.3 g/dL (ref 32.0–36.0)
MCV: 91.9 fL (ref 80.0–100.0)
MPV: 10.3 fL (ref 7.5–12.5)
Monocytes Relative: 6.4 %
Neutro Abs: 2538 cells/uL (ref 1500–7800)
Neutrophils Relative %: 51.8 %
Platelets: 300 10*3/uL (ref 140–400)
RBC: 4.47 10*6/uL (ref 3.80–5.10)
RDW: 12.4 % (ref 11.0–15.0)
Total Lymphocyte: 40.2 %
WBC: 4.9 10*3/uL (ref 3.8–10.8)

## 2020-04-12 LAB — COMPLETE METABOLIC PANEL WITH GFR
AG Ratio: 1.7 (calc) (ref 1.0–2.5)
ALT: 13 U/L (ref 6–29)
AST: 17 U/L (ref 10–30)
Albumin: 4.4 g/dL (ref 3.6–5.1)
Alkaline phosphatase (APISO): 47 U/L (ref 31–125)
BUN: 9 mg/dL (ref 7–25)
CO2: 23 mmol/L (ref 20–32)
Calcium: 9.8 mg/dL (ref 8.6–10.2)
Chloride: 104 mmol/L (ref 98–110)
Creat: 0.95 mg/dL (ref 0.50–1.10)
GFR, Est African American: 89 mL/min/{1.73_m2} (ref >=60)
GFR, Est Non African American: 77 mL/min/{1.73_m2} (ref >=60)
Globulin: 2.6 g/dL (calc) (ref 1.9–3.7)
Glucose, Bld: 120 mg/dL — ABNORMAL HIGH (ref 65–99)
Potassium: 3.9 mmol/L (ref 3.5–5.3)
Sodium: 138 mmol/L (ref 135–146)
Total Bilirubin: 0.7 mg/dL (ref 0.2–1.2)
Total Protein: 7 g/dL (ref 6.1–8.1)

## 2020-04-12 LAB — CK: Total CK: 105 U/L (ref 29–143)

## 2020-04-20 NOTE — Progress Notes (Signed)
New Patient Office Visit  Subjective:  Patient ID: Leslie Owen, female    DOB: 06/30/1981  Age: 39 y.o. MRN: [id number]  CC:  Chief Complaint  Patient presents with  . Establish Care  . Follow-up    [facility] on 04/11/2020  . Leg Pain    left leg    HPI Leslie Owen presents to establish care.  Recently seen at [facility] for left upper and lower leg numbness/swelling and tenderness to the anterior aspects. Negative [place]. Labs normal. Given prednisone taper.  She has since completed the prednisone taper and notes that it did help her pain somewhat and reduced her left leg swelling.  She continues to have tingling/numbness to the left leg from the inner foot extending up to the left hip along the anterior surfaces.  She also has subjective weakness in the left leg as well as muscle tenderness along the medial calf and medial/anterior/lateral thigh.  Notes that standing and ambulation makes her numbness, tingling, and pain worse.  If she sits for a long period of time she has to stand up and stretch.  Notes that her hip does pop more frequently but is painless.  She has to take frequent rest breaks to get relief, usually needing approximately 15 minutes of rest for her symptoms to improve to tolerable.  She has had bilateral pedal edema and notes that her feet do stay cool to touch.  Has not been taking any medications at home to treat her discomfort after finishing the prednisone.  Past Medical History:  Diagnosis Date  . Anemia   . Encounter for diagnostic endoscopy   . Gall bladder stones   . Pulmonary emboli ([place])   . Pulmonary embolus ([place])   . Ulcer   . Uterine fibroids affecting pregnancy     Past Surgical History:  Procedure Laterality Date  . ABDOMINAL HYSTERECTOMY    . ABLATION    . ABLATION    . BLADDER SUSPENSION    . CESAREAN SECTION    . CHOLECYSTECTOMY    . DIAGNOSTIC LAPAROSCOPY    . laporoscopy    . MYOMECTOMY    . ovarian cysts     . PELVIC LAPAROSCOPY    . TUBAL LIGATION    . WISDOM TOOTH EXTRACTION      Family History  Problem Relation Age of Onset  . Hypertension Mother   . Thyroid disease Mother   . Arthritis Mother   . Diabetes Mother   . Cancer Father   . Heart disease Father   . Heart attack Father   . Arthritis Father   . Healthy Brother   . Cancer Maternal Aunt        leukemia   . Lupus Maternal Aunt   . Fibromyalgia Maternal Aunt   . Cancer Maternal Grandfather 60       lung cancer   . Cancer Maternal Aunt        breast ca  . Lupus Maternal Aunt   . Fibromyalgia Maternal Aunt   . Cancer Maternal Aunt        lung ca  . Breast cancer Neg Hx     Social History   Socioeconomic History  . Marital status: Married    Spouse name: Not on file  . Number of children: Not on file  . Years of education: Not on file  . Highest education level: Not on file  Occupational History  . Not on file  Tobacco Use  . Smoking status: Never Smoker  . Smokeless tobacco: Never Used  Vaping Use  . Vaping Use: Never used  Substance and Sexual Activity  . Alcohol use: Yes    Comment: rarely  . Drug use: Never  . Sexual activity: Yes    Birth control/protection: Surgical  Other Topics Concern  . Not on file  Social History Narrative   ** Merged History Encounter **       Social Determinants of Health   Financial Resource Strain:   . Difficulty of Paying Living Expenses: Not on file  Food Insecurity:   . Worried About Charity fundraiser in the Last Year: Not on file  . Ran Out of Food in the Last Year: Not on file  Transportation Needs:   . Lack of Transportation (Medical): Not on file  . Lack of Transportation (Non-Medical): Not on file  Physical Activity:   . Days of Exercise per Week: Not on file  . Minutes of Exercise per Session: Not on file  Stress:   . Feeling of Stress : Not on file  Social Connections:   . Frequency of Communication with Friends and Family: Not on file  .  Frequency of Social Gatherings with Friends and Family: Not on file  . Attends Religious Services: Not on file  . Active Member of Clubs or Organizations: Not on file  . Attends Archivist Meetings: Not on file  . Marital Status: Not on file  Intimate Partner Violence:   . Fear of Current or Ex-Partner: Not on file  . Emotionally Abused: Not on file  . Physically Abused: Not on file  . Sexually Abused: Not on file    ROS Review of Systems  Constitutional: Negative for chills, fatigue, fever and unexpected weight change.  Eyes: Negative for visual disturbance.  Respiratory: Negative for cough, chest tightness, shortness of breath and wheezing.   Cardiovascular: Positive for leg swelling. Negative for chest pain and palpitations.  Gastrointestinal: Negative for abdominal pain, constipation, diarrhea, nausea and vomiting.  Genitourinary: Negative for dysuria, frequency and urgency.  Musculoskeletal: Positive for myalgias.  Neurological: Positive for weakness and numbness. Negative for dizziness, light-headedness and headaches.  Psychiatric/Behavioral: Negative for dysphoric mood, self-injury, sleep disturbance and suicidal ideas. The patient is not nervous/anxious.     Objective:   Today's Vitals: BP 115/80   Pulse 68   Temp 98.2 F (36.8 C) (Oral)   Ht '5\' 3"'  (1.6 m)   Wt 168 lb 6.4 oz (76.4 kg)   SpO2 99%   BMI 29.83 kg/m   Physical Exam Vitals reviewed.  Constitutional:      General: She is not in acute distress.    Appearance: Normal appearance.  HENT:     Head: Normocephalic and atraumatic.  Cardiovascular:     Rate and Rhythm: Normal rate and regular rhythm.     Pulses: Normal pulses.     Heart sounds: Normal heart sounds. No murmur heard.  No friction rub. No gallop.   Pulmonary:     Effort: Pulmonary effort is normal. No respiratory distress.     Breath sounds: Normal breath sounds. No wheezing.  Skin:    General: Skin is warm and dry.   Neurological:     Mental Status: She is alert and oriented to person, place, and time.  Psychiatric:        Mood and Affect: Mood normal.        Behavior: Behavior normal.  Thought Content: Thought content normal.        Judgment: Judgment normal.     Assessment & Plan:   1. Encounter to establish care Reviewed available information and discussed health care concerns with patient.  She does have a quite a history of medical concerns and surgical procedures.  Overall up-to-date on preventative care.  2. Left leg pain/paresthesias Normal white blood cells, electrolytes, and CK at urgent care.  Checking CRP and ESR today.  We will go ahead and get an ABI to rule out peripheral vascular/arterial disease.  Also checking a nerve conduction test for the lower extremities to evaluate for possible cause of numbness/tingling. - Sed Rate (ESR) - High sensitivity CRP - VAS [place] ABI WITH/WO TBI; Future - Nerve conduction test; Future  3. Need for Tdap vaccination Tdap given in office today. - Tdap vaccine greater than or equal to 7yo IM  4. Encounter for screening for HIV Discussed screening recommendations.  Patient is agreeable to this so we will add to blood work today. - HIV Antibody (routine testing w rflx)  5. Need for hepatitis C screening test Discussed screening recommendations.  Patient is agreeable so we will add this to blood work today as well. - Hepatitis C antibody   Outpatient Encounter Medications as of 04/21/2020  Medication Sig  . [DISCONTINUED] naproxen (NAPROSYN) 500 MG tablet Take 1 tablet (500 mg total) by mouth 2 (two) times daily. (Patient not taking: Reported on 02/12/2017)  . [DISCONTINUED] predniSONE (DELTASONE) 20 MG tablet Take one tab by mouth twice daily for 4 days, then one daily for 3 days. Take with food. (Patient not taking: Reported on 04/21/2020)   No facility-administered encounter medications on file as of 04/21/2020.    Follow-up: Return if  symptoms worsen or fail to improve.  Further follow-up pending lab and test results.  Clearnce Sorrel, DNP, APRN, FNP-BC [facility]

## 2020-04-21 ENCOUNTER — Encounter: Payer: Self-pay | Admitting: Medical-Surgical

## 2020-04-21 ENCOUNTER — Ambulatory Visit (INDEPENDENT_AMBULATORY_CARE_PROVIDER_SITE_OTHER): Payer: BC Managed Care – PPO | Admitting: Medical-Surgical

## 2020-04-21 ENCOUNTER — Other Ambulatory Visit: Payer: Self-pay

## 2020-04-21 VITALS — BP 115/80 | HR 68 | Temp 98.2°F | Ht 63.0 in | Wt 168.4 lb

## 2020-04-21 DIAGNOSIS — Z7689 Persons encountering health services in other specified circumstances: Secondary | ICD-10-CM

## 2020-04-21 DIAGNOSIS — Z1159 Encounter for screening for other viral diseases: Secondary | ICD-10-CM

## 2020-04-21 DIAGNOSIS — R202 Paresthesia of skin: Secondary | ICD-10-CM | POA: Diagnosis not present

## 2020-04-21 DIAGNOSIS — M79605 Pain in left leg: Secondary | ICD-10-CM | POA: Diagnosis not present

## 2020-04-21 DIAGNOSIS — Z114 Encounter for screening for human immunodeficiency virus [HIV]: Secondary | ICD-10-CM

## 2020-04-21 DIAGNOSIS — Z23 Encounter for immunization: Secondary | ICD-10-CM | POA: Diagnosis not present

## 2020-04-21 NOTE — Patient Instructions (Addendum)

## 2020-04-22 LAB — HIGH SENSITIVITY CRP: hs-CRP: <0.3 mg/L

## 2020-04-22 LAB — HIV ANTIBODY (ROUTINE TESTING W REFLEX): HIV 1&2 Ab, 4th Generation: NONREACTIVE

## 2020-04-22 LAB — HEPATITIS C ANTIBODY
Hepatitis C Ab: NONREACTIVE
SIGNAL TO CUT-OFF: 0.01 (ref <1.00)

## 2020-04-22 LAB — SEDIMENTATION RATE: Sed Rate: 2 mm/h (ref 0–20)

## 2020-06-09 ENCOUNTER — Other Ambulatory Visit: Payer: Self-pay

## 2020-06-09 ENCOUNTER — Ambulatory Visit (HOSPITAL_BASED_OUTPATIENT_CLINIC_OR_DEPARTMENT_OTHER)
Admission: RE | Admit: 2020-06-09 | Discharge: 2020-06-09 | Disposition: A | Discharge status: Home / Self Care | Payer: BC Managed Care – PPO | Source: Ambulatory Visit | Attending: Medical-Surgical | Admitting: Medical-Surgical

## 2020-06-09 DIAGNOSIS — M79605 Pain in left leg: Secondary | ICD-10-CM | POA: Insufficient documentation

## 2020-06-09 DIAGNOSIS — R202 Paresthesia of skin: Secondary | ICD-10-CM | POA: Insufficient documentation

## 2020-07-15 NOTE — Progress Notes (Addendum)
[facility]    Provider:  Dr Lucia GaskinsAhern Requesting Provider: Christen ButterJessup, Joy, NP Primary Care Provider:  Christen ButterJessup, Joy, NP  CC:  Pain, numbness and weakness in both legs from the hips down  HPI:  Leslie Owen is a 39 y.o. female here as requested by Christen ButterJessup, Joy, NP for consideration of EMG nerve conduction study on the lower extremities for left leg pain and left leg paresthesias.  She has a past medical history of leg pain and pulmonary embolus.      She is here alone, every started acutely in September. She should up and fell to the floor couldn't put any pressure on the left foot with swelling of the left leg to the hips. No rash, no redness, it was so swollen she couldn't put her foot in her shoe. She couldn't walk on it. They went to the ED. Also numbness with the swelling of the whole leg. Right leg was fine. No triggers, the night before was like any other, no illnesses, standing would hurt, nothing made it better so went to the urgent care (see summary below). She had no other symptoms, no right leg symptoms, arms were fine, she felt her leg was weak and painful. Noticed tenderness to touch in the urgent care. They sent her out. Since then she still hjas pain, spread to the right leg, she is able to walk, they both bother her, when standing she feels her joints hurt, residual tingling in the feet on the bottom, strength is improved, never spread to the hands. Left leg is worse. No muscle wasting. She can walk a little slower and is fine. NOt related to vaccine. The steroids improved the swelling and numbness/tingling and about a week later the right leg started to get tingly without swelling.     I reviewed patient's chart, she was seen in September of this year in the emergency room for left leg pain and swelling, patient reported that she awoke the prior day with pain and a sensation of numbness in the anterior aspect of her entire left leg from her foot to her upper thigh,  she believes that she had mild swelling also, pain was exacerbated by standing walking, right leg was fine, she did not recall any injury to her leg, otherwise she was well, severity moderate.  She denied back pain, fatigue, fever, muscle weakness.  She was given prednisone treatment.  Examination was unremarkable except for vague mild tenderness to palpation over the anterior thigh over the anterior compartments of the lower leg, no warmth or erythema, no tenderness to palpation of left calf or posterior thigh.  CRP and sed rate were normal, HIV was nonreactive, CMP was unremarkable with BUN 9 and creatinine 0.95, CBC was normal, CK was normal. She has back pain and groin numbness.   Reviewed notes, labs and imaging from outside physicians, which showed:  I reviewed report ultrasound Doppler of the lower extremity venous, no significant findings, negative examination.  I also reviewed MRI brain images from December 2016 [facility] with unexplained headaches on the right side.  She had chronic microvascular ischemic changes otherwise normal MRI appearance of the brain.  I reviewed the report which showed negative intracranial MRA and MRV.  There was no acute intracranial abnormality.  Review of Systems: Patient complains of symptoms per HPI as well as the following symptoms: burning/numbness. Pertinent negatives and positives per HPI. All others negative.   Social History  Socioeconomic History  . Marital status: Married    Spouse name: Not on file  . Number of children: Not on file  . Years of education: Not on file  . Highest education level: Not on file  Occupational History  . Not on file  Tobacco Use  . Smoking status: Never Smoker  . Smokeless tobacco: Never Used  Vaping Use  . Vaping Use: Never used  Substance and Sexual Activity  . Alcohol use: Yes    Comment: rarely  . Drug use: Never  . Sexual activity: Yes    Birth control/protection:  Surgical  Other Topics Concern  . Not on file  Social History Narrative   ** Merged History Encounter **    Live with husband and 3 children   R handed   3-4 cups of caffeine daily    Social Determinants of Health   Financial Resource Strain: Not on file  Food Insecurity: Not on file  Transportation Needs: Not on file  Physical Activity: Not on file  Stress: Not on file  Social Connections: Not on file  Intimate Partner Violence: Not on file    Family History  Problem Relation Age of Onset  . Hypertension Mother   . Thyroid disease Mother   . Arthritis Mother   . Diabetes Mother   . Cancer Father   . Heart disease Father   . Heart attack Father   . Arthritis Father   . Healthy Brother   . Cancer Maternal Aunt        leukemia   . Lupus Maternal Aunt   . Fibromyalgia Maternal Aunt   . Cancer Maternal Grandfather 60       lung cancer   . Cancer Maternal Aunt        breast ca  . Lupus Maternal Aunt   . Fibromyalgia Maternal Aunt   . Cancer Maternal Aunt        lung ca  . Breast cancer Neg Hx     Past Medical History:  Diagnosis Date  . Anemia   . Encounter for diagnostic endoscopy   . Gall bladder stones   . Migraine   . Pulmonary emboli ([place])   . Pulmonary embolus ([place])   . Ulcer   . Uterine fibroids affecting pregnancy     Patient Active Problem List   Diagnosis Date Noted  . Ovarian cyst, left 02/17/2015  . Breast pain, left 01/22/2015  . Female pelvic pain 01/22/2015  . Pulmonary embolism ([place]) 10/02/2014  . FIBROIDS, UTERUS 05/30/2007  . ENDOMETRIOSIS 05/30/2007    Past Surgical History:  Procedure Laterality Date  . ABDOMINAL HYSTERECTOMY    . ABLATION    . ABLATION    . BLADDER SUSPENSION    . CESAREAN SECTION    . CHOLECYSTECTOMY    . DIAGNOSTIC LAPAROSCOPY    . laporoscopy    . MYOMECTOMY    . ovarian cysts    . PELVIC LAPAROSCOPY    . TUBAL LIGATION    . UPPER GI ENDOSCOPY    . WISDOM TOOTH EXTRACTION      No current  outpatient medications on file.   No current facility-administered medications for this visit.    Allergies as of 07/16/2020 - Review Complete 04/21/2020  Allergen Reaction Noted  . Levofloxacin Hives 09/13/2016    Vitals: BP 108/82   Pulse 79   Ht 5\' 3"  (1.6 m)   Wt 166 lb 9.6 oz (75.6 kg)   SpO2 97%  BMI 29.51 kg/m  Last Weight:  Wt Readings from Last 1 Encounters:  07/16/20 166 lb 9.6 oz (75.6 kg)   Last Height:   Ht Readings from Last 1 Encounters:  07/16/20 5\' 3"  (1.6 m)     Physical exam: Exam: Gen: NAD, conversant, well nourised, overweigt, well groomed                     CV: RRR, no MRG. No Carotid Bruits. No peripheral edema, warm, nontender Eyes: Conjunctivae clear without exudates or hemorrhage  Neuro: Detailed Neurologic Exam  Speech:    Speech is normal; fluent and spontaneous with normal comprehension.  Cognition:    The patient is oriented to person, place, and time;     recent and remote memory intact;     language fluent;     normal attention, concentration,     fund of knowledge Cranial Nerves:    The pupils are equal, round, and reactive to light. The fundi are normal and spontaneous venous pulsations are present. Visual fields are full to finger confrontation. Extraocular movements are intact. Trigeminal sensation is intact and the muscles of mastication are normal. The face is symmetric. The palate elevates in the midline. Hearing intact. Voice is normal. Shoulder shrug is normal. The tongue has normal motion without fasciculations.   Coordination:    Normal finger to nose    Gait:    Heel-toe and tandem gait are normal.   Motor Observation:    No asymmetry, no atrophy, and no involuntary movements noted. Tone:    Normal muscle tone.    Posture:    Posture is normal. normal erect    Strength: Bilateral leg flexion slight weakness 5-/5. Otherwise strength is V/V in the upper and lower limbs.      Sensation: intact to LT      Reflex Exam:  DTR's: 1+ AJs, trace patellars, 2+ biceps.    Deep tendon reflexes in the upper and lower extremities are symmetrical bilaterally.   Toes:    The toes are downgoing bilaterally.   Clonus:    Clonus is absent.    Assessment/Plan:  Patient with acute left leg weakness and paresthesias follewed to the right leg, residual paresthesias in the feet, weakness on exam, current saddle anesthesia, need MRI lumbar spine to rule out cauda equina.   MRI Lumbar spine Blood work If symptoms worsen, patient advised to contact us and we will schedule an emg/ncs  Orders Placed This Encounter  Procedures  . MR LUMBAR SPINE WO CONTRAST  . B12 and Folate Panel  . Methylmalonic acid, serum  . Vitamin B6  . Hemoglobin A1c  . TSH  . RPR  . Heavy metals, blood  . Vitamin B1   No orders of the defined types were placed in this encounter.   Cc: Samuel Bouche, NP,  Samuel Bouche, NP  Sarina Ill, MD  [facility] [address] [place], [place] [address]  Phone [phone] Fax [phone]

## 2020-07-16 ENCOUNTER — Encounter: Payer: Self-pay | Admitting: Neurology

## 2020-07-16 ENCOUNTER — Ambulatory Visit (INDEPENDENT_AMBULATORY_CARE_PROVIDER_SITE_OTHER): Payer: BC Managed Care – PPO | Admitting: Neurology

## 2020-07-16 VITALS — BP 108/82 | HR 79 | Ht 63.0 in | Wt 166.6 lb

## 2020-07-16 DIAGNOSIS — M5442 Lumbago with sciatica, left side: Secondary | ICD-10-CM

## 2020-07-16 DIAGNOSIS — M4807 Spinal stenosis, lumbosacral region: Secondary | ICD-10-CM

## 2020-07-16 DIAGNOSIS — M5416 Radiculopathy, lumbar region: Secondary | ICD-10-CM

## 2020-07-16 DIAGNOSIS — G629 Polyneuropathy, unspecified: Secondary | ICD-10-CM

## 2020-07-16 DIAGNOSIS — R200 Anesthesia of skin: Secondary | ICD-10-CM

## 2020-07-16 DIAGNOSIS — R202 Paresthesia of skin: Secondary | ICD-10-CM

## 2020-07-16 DIAGNOSIS — R29898 Other symptoms and signs involving the musculoskeletal system: Secondary | ICD-10-CM | POA: Diagnosis not present

## 2020-07-16 DIAGNOSIS — G8929 Other chronic pain: Secondary | ICD-10-CM

## 2020-07-16 DIAGNOSIS — M5441 Lumbago with sciatica, right side: Secondary | ICD-10-CM

## 2020-07-16 DIAGNOSIS — G834 Cauda equina syndrome: Secondary | ICD-10-CM

## 2020-07-16 NOTE — Patient Instructions (Addendum)
MRI Lumbar spine Blood work  If symptoms worsen, we will schedule an emg/ncs   Electromyoneurogram Electromyoneurogram is a test to check how well your muscles and nerves are working. This procedure includes the combined use of electromyogram (EMG) and nerve conduction study (NCS). EMG is used to look for muscular disorders. NCS, which is also called electroneurogram, measures how well your nerves are controlling your muscles. The procedures are usually done together to check if your muscles and nerves are healthy. If the results of the tests are abnormal, this may indicate disease or injury, such as a neuromuscular disease or peripheral nerve damage. Tell a health care provider about:  Any allergies you have.  All medicines you are taking, including vitamins, herbs, eye drops, creams, and over-the-counter medicines.  Any problems you or family members have had with anesthetic medicines.  Any blood disorders you have.  Any surgeries you have had.  Any medical conditions you have.  If you have a pacemaker.  Whether you are pregnant or may be pregnant. What are the risks? Generally, this is a safe procedure. However, problems may occur, including:  Infection where the electrodes were inserted.  Bleeding. What happens before the procedure? Medicines Ask your health care provider about:  Changing or stopping your regular medicines. This is especially important if you are taking diabetes medicines or blood thinners.  Taking medicines such as aspirin and ibuprofen. These medicines can thin your blood. Do not take these medicines unless your health care provider tells you to take them.  Taking over-the-counter medicines, vitamins, herbs, and supplements. General instructions  Your health care provider may ask you to avoid: ? Beverages that have caffeine, such as coffee and tea. ? Any products that contain nicotine or tobacco. These products include cigarettes, e-cigarettes, and  chewing tobacco. If you need help quitting, ask your health care provider.  Do not use lotions or creams on the same day that you will be having the procedure. What happens during the procedure? For EMG   Your health care provider will ask you to stay in a position so that he or she can access the muscle that will be studied. You may be standing, sitting, or lying down.  You may be given a medicine that numbs the area (local anesthetic).  A very thin needle that has an electrode will be inserted into your muscle.  Another small electrode will be placed on your skin near the muscle.  Your health care provider will ask you to continue to remain still.  The electrodes will send a signal that tells about the electrical activity of your muscles. You may see this on a monitor or hear it in the room.  After your muscles have been studied at rest, your health care provider will ask you to contract or flex your muscles. The electrodes will send a signal that tells about the electrical activity of your muscles.  Your health care provider will remove the electrodes and the electrode needles when the procedure is finished. The procedure may vary among health care providers and hospitals. For NCS   An electrode that records your nerve activity (recording electrode) will be placed on your skin by the muscle that is being studied.  An electrode that is used as a reference (reference electrode) will be placed near the recording electrode.  A paste or gel will be applied to your skin between the recording electrode and the reference electrode.  Your nerve will be stimulated with a mild shock.  Your health care provider will measure how much time it takes for your muscle to react.  Your health care provider will remove the electrodes and the gel when the procedure is finished. The procedure may vary among health care providers and hospitals. What happens after the procedure?  It is up to you to  get the results of your procedure. Ask your health care provider, or the department that is doing the procedure, when your results will be ready.  Your health care provider may: ? Give you medicines for any pain. ? Monitor the insertion sites to make sure that bleeding stops. Summary  Electromyoneurogram is a test to check how well your muscles and nerves are working.  If the results of the tests are abnormal, this may indicate disease or injury.  This is a safe procedure. However, problems may occur, such as bleeding and infection.  Your health care provider will do two tests to complete this procedure. One checks your muscles (EMG) and another checks your nerves (NCS).  It is up to you to get the results of your procedure. Ask your health care provider, or the department that is doing the procedure, when your results will be ready. This information is not intended to replace advice given to you by your health care provider. Make sure you discuss any questions you have with your health care provider. Document Revised: 03/28/2018 Document Reviewed: 03/10/2018 Elsevier Patient Education  [place].

## 2020-07-17 ENCOUNTER — Telehealth: Payer: Self-pay | Admitting: Neurology

## 2020-07-17 NOTE — Telephone Encounter (Signed)
Called and spoke to [facility] 07/17/2020 MRI Aaron Edelman approved  [id number]  Auth # [id number] 07/16/2020 01/11/2021 .  I will call on 07/21/2020 and get Benefits and schedule Patient on Mobile. Jayme Cloud

## 2020-07-23 LAB — HEAVY METALS, BLOOD
Arsenic: 1 ug/L — ABNORMAL LOW (ref 2–23)
Lead, Blood: <1 ug/dL (ref 0–4)
Mercury: 1.1 ug/L (ref 0.0–14.9)

## 2020-07-23 LAB — VITAMIN B6: Vitamin B6: 7.6 ug/L (ref 2.0–32.8)

## 2020-07-23 LAB — B12 AND FOLATE PANEL
Folate: 8.6 ng/mL (ref >3.0)
Vitamin B-12: 439 pg/mL (ref 232–1245)

## 2020-07-23 LAB — HEMOGLOBIN A1C
Est. average glucose Bld gHb Est-mCnc: 117 mg/dL
Hgb A1c MFr Bld: 5.7 % — ABNORMAL HIGH (ref 4.8–5.6)

## 2020-07-23 LAB — RPR: RPR Ser Ql: NONREACTIVE

## 2020-07-23 LAB — METHYLMALONIC ACID, SERUM: Methylmalonic Acid: 153 nmol/L (ref 0–378)

## 2020-07-23 LAB — VITAMIN B1: Thiamine: 66.3 nmol/L — ABNORMAL LOW (ref 66.5–200.0)

## 2020-07-23 LAB — TSH: TSH: 1.03 u[IU]/mL (ref 0.450–4.500)

## 2020-07-24 ENCOUNTER — Telehealth: Payer: Self-pay | Admitting: *Deleted

## 2020-07-24 NOTE — Telephone Encounter (Signed)
-----   Message from Anson Fret, MD sent at 07/24/2020 10:14 AM EST ----- Let patient know that Thiamine is low I would have her buy a supplement over the counter and take it daily usually 100mg . Thimaine deficiency can cause a lot of problems including neuropathy/nerve pain.  thanks

## 2020-07-24 NOTE — Telephone Encounter (Signed)
Left patient a detailed message, with results and supplement recommendation, on voicemail (ok per DPR).  Provided our number to call back with any questions.

## 2020-08-12 ENCOUNTER — Other Ambulatory Visit: Payer: BC Managed Care – PPO

## 2020-09-18 ENCOUNTER — Encounter: Payer: Self-pay | Admitting: Neurology

## 2021-04-09 ENCOUNTER — Encounter: Payer: Self-pay | Admitting: Nurse Practitioner

## 2021-04-09 ENCOUNTER — Ambulatory Visit (INDEPENDENT_AMBULATORY_CARE_PROVIDER_SITE_OTHER): Payer: BC Managed Care – PPO | Admitting: Nurse Practitioner

## 2021-04-09 ENCOUNTER — Telehealth: Payer: Self-pay | Admitting: *Deleted

## 2021-04-09 ENCOUNTER — Other Ambulatory Visit: Payer: Self-pay

## 2021-04-09 ENCOUNTER — Other Ambulatory Visit: Payer: Self-pay | Admitting: Nurse Practitioner

## 2021-04-09 VITALS — BP 116/76 | Ht 62.0 in | Wt 162.0 lb

## 2021-04-09 DIAGNOSIS — N6315 Unspecified lump in the right breast, overlapping quadrants: Secondary | ICD-10-CM | POA: Diagnosis not present

## 2021-04-09 DIAGNOSIS — N644 Mastodynia: Secondary | ICD-10-CM | POA: Diagnosis not present

## 2021-04-09 NOTE — Progress Notes (Signed)
   Acute Office Visit  Subjective:    Patient ID: Leslie Owen, female    DOB: 21-Jul-1981, 40 y.o.   MRN: [id number]   HPI 40 y.o. presents today for right breast pain. Last mammogram 02/2018 - probable benign right breast mass at 6 o'clock has demonstrated 2 years of stability with recommendations for annual screening mammograms starting at age 71. This pain started 2 weeks ago, is progressively worsening, and is described as pinching. She is taking Tylenol and Ibuprofen with some improvement.    Review of Systems  Constitutional: Negative.   Right breast: Positive for pain. Negative for discharge, inverted nipple, swelling, or redness    Objective:    Physical Exam Constitutional:      Appearance: Normal appearance.  Chest:  Breasts:    Right: Mass and tenderness present. No swelling, inverted nipple, nipple discharge or skin change.     Left: Normal.    Lymphadenopathy:     Upper Body:     Right upper body: No supraclavicular or axillary adenopathy.    BP 116/76   Ht '5\' 2"'$  (1.575 m)   Wt 162 lb (73.5 kg)   BMI 29.63 kg/m  Wt Readings from Last 3 Encounters:  04/09/21 162 lb (73.5 kg)  07/16/20 166 lb 9.6 oz (75.6 kg)  04/21/20 168 lb 6.4 oz (76.4 kg)        Assessment & Plan:   Problem List Items Addressed This Visit   None Visit Diagnoses     Breast lump on right side at 3 o'clock position    -  Primary   Pain of right breast          Plan: Will send referral for right breast ultrasound. Continue Tylenol/Ibuprofen as needed, avoid vigorous activity and tight-fitting bras.      [place], 9:15 AM 04/09/2021

## 2021-04-09 NOTE — Telephone Encounter (Signed)
-----   Message from Tamela Gammon, NP sent at 04/09/2021  9:15 AM EDT ----- Please send referral for right breast ultrasound for breast pain and mass at 3 o'clock. Thank you.

## 2021-04-09 NOTE — Telephone Encounter (Signed)
Patient scheduled 05/14/21 @  8:20am at the breast center. Patient aware she can call daily/weekly to check for any cancellations.

## 2021-05-06 ENCOUNTER — Ambulatory Visit
Admission: RE | Admit: 2021-05-06 | Discharge: 2021-05-06 | Disposition: A | Discharge status: Home / Self Care | Payer: BC Managed Care – PPO | Source: Ambulatory Visit | Attending: Nurse Practitioner | Admitting: Nurse Practitioner

## 2021-05-06 ENCOUNTER — Other Ambulatory Visit: Payer: Self-pay

## 2021-05-06 DIAGNOSIS — N6315 Unspecified lump in the right breast, overlapping quadrants: Secondary | ICD-10-CM

## 2021-05-08 ENCOUNTER — Other Ambulatory Visit: Payer: Self-pay

## 2021-05-08 ENCOUNTER — Ambulatory Visit (INDEPENDENT_AMBULATORY_CARE_PROVIDER_SITE_OTHER): Payer: BC Managed Care – PPO | Admitting: Nurse Practitioner

## 2021-05-08 ENCOUNTER — Encounter: Payer: Self-pay | Admitting: Nurse Practitioner

## 2021-05-08 VITALS — BP 114/70 | Ht 63.0 in | Wt 162.0 lb

## 2021-05-08 DIAGNOSIS — Z01419 Encounter for gynecological examination (general) (routine) without abnormal findings: Secondary | ICD-10-CM

## 2021-05-08 NOTE — Progress Notes (Signed)
   Leslie Owen 03/12/1981 [id number]   History:  40 y.o. [id number] presents for annual exam. 2015 hysterectomy for fibroids and endometriosis. 05/06/2021 diagnostic mammogram for right breast pain, negative. Normal pap history. History of PE.  Gynecologic History No LMP recorded. Patient has had a hysterectomy.   Contraception/Family planning: status post hysterectomy Sexually active: Yes  Health Maintenance Last Pap: 01/30/2016. Results were: Normal Last mammogram: 05/06/2021. Results were: Normal Last colonoscopy: Not indicated Last Dexa: Not indicated  Past medical history, past surgical history, family history and social history were all reviewed and documented in the EPIC chart. Married. Works at [employer]. 3 children - 38 yo son, 34 and 22 to daughters.   ROS:  A ROS was performed and pertinent positives and negatives are included.  Exam:  Vitals:   05/08/21 0825  BP: 114/70  Weight: 162 lb (73.5 kg)  Height: 5\' 3"  (1.6 m)   Body mass index is 28.7 kg/m.  General appearance:  Normal Thyroid:  Symmetrical, normal in size, without palpable masses or nodularity. Respiratory  Auscultation:  Clear without wheezing or rhonchi Cardiovascular  Auscultation:  Regular rate, without rubs, murmurs or gallops  Edema/varicosities:  Not grossly evident Abdominal  Soft,nontender, without masses, guarding or rebound.  Liver/spleen:  No organomegaly noted  Hernia:  None appreciated  Skin  Inspection:  Grossly normal Breasts: Examined lying and sitting.   Right: Without masses, retractions, nipple discharge or axillary adenopathy.   Left: Without masses, retractions, nipple discharge or axillary adenopathy. Genitourinary   Inguinal/mons:  Normal without inguinal adenopathy  External genitalia:  Normal appearing vulva with no masses, tenderness, or lesions  BUS/Urethra/Skene's glands:  Normal  Vagina:  Normal appearing with normal color and discharge, no lesions  Cervix:   Absent  Uterus:  Absent  Adnexa/parametria:     Rt: Normal in size, without masses or tenderness.   Lt: Normal in size, without masses or tenderness.  Anus and perineum: Normal  Patient informed chaperone available to be present for breast and pelvic exam. Patient has requested no chaperone to be present. Patient has been advised what will be completed during breast and pelvic exam.   Assessment/Plan:  40 y.o. [id number] for annual exam.   Well female exam with routine gynecological exam - Plan: CBC with Differential/Platelet, Comprehensive metabolic panel, Lipid panel. Education provided on SBEs, importance of preventative screenings, current guidelines, high calcium diet, regular exercise, and multivitamin daily.   Screening for cervical cancer - Normal Pap history. No  longer screening per guidelines.   Return in 1 year for annual.   Tamela Gammon DNP, 8:36 AM 05/08/2021

## 2021-05-09 LAB — LIPID PANEL
Cholesterol: 149 mg/dL (ref <200)
HDL: 65 mg/dL (ref >=50)
LDL Cholesterol (Calc): 70 mg/dL (calc)
Non-HDL Cholesterol (Calc): 84 mg/dL (calc) (ref <130)
Total CHOL/HDL Ratio: 2.3 (calc) (ref <5.0)
Triglycerides: 63 mg/dL (ref <150)

## 2021-05-09 LAB — CBC WITH DIFFERENTIAL/PLATELET
Absolute Monocytes: 396 cells/uL (ref 200–950)
Basophils Absolute: 9 cells/uL (ref 0–200)
Basophils Relative: 0.2 %
Eosinophils Absolute: 82 cells/uL (ref 15–500)
Eosinophils Relative: 1.9 %
HCT: 40.2 % (ref 35.0–45.0)
Hemoglobin: 13.4 g/dL (ref 11.7–15.5)
Lymphs Abs: 1462 cells/uL (ref 850–3900)
MCH: 30.5 pg (ref 27.0–33.0)
MCHC: 33.3 g/dL (ref 32.0–36.0)
MCV: 91.4 fL (ref 80.0–100.0)
MPV: 10.1 fL (ref 7.5–12.5)
Monocytes Relative: 9.2 %
Neutro Abs: 2352 cells/uL (ref 1500–7800)
Neutrophils Relative %: 54.7 %
Platelets: 283 10*3/uL (ref 140–400)
RBC: 4.4 10*6/uL (ref 3.80–5.10)
RDW: 11.8 % (ref 11.0–15.0)
Total Lymphocyte: 34 %
WBC: 4.3 10*3/uL (ref 3.8–10.8)

## 2021-05-09 LAB — COMPREHENSIVE METABOLIC PANEL
AG Ratio: 1.8 (calc) (ref 1.0–2.5)
ALT: 7 U/L (ref 6–29)
AST: 13 U/L (ref 10–30)
Albumin: 4.3 g/dL (ref 3.6–5.1)
Alkaline phosphatase (APISO): 38 U/L (ref 31–125)
BUN: 9 mg/dL (ref 7–25)
CO2: 24 mmol/L (ref 20–32)
Calcium: 9.5 mg/dL (ref 8.6–10.2)
Chloride: 107 mmol/L (ref 98–110)
Creat: 0.91 mg/dL (ref 0.50–0.97)
Globulin: 2.4 g/dL (calc) (ref 1.9–3.7)
Glucose, Bld: 86 mg/dL (ref 65–99)
Potassium: 4.4 mmol/L (ref 3.5–5.3)
Sodium: 139 mmol/L (ref 135–146)
Total Bilirubin: 0.6 mg/dL (ref 0.2–1.2)
Total Protein: 6.7 g/dL (ref 6.1–8.1)

## 2021-05-14 ENCOUNTER — Other Ambulatory Visit: Payer: BC Managed Care – PPO

## 2021-12-24 ENCOUNTER — Encounter: Payer: Self-pay | Admitting: Medical-Surgical

## 2022-03-09 DIAGNOSIS — K5730 Diverticulosis of large intestine without perforation or abscess without bleeding: Secondary | ICD-10-CM | POA: Diagnosis not present

## 2022-03-09 DIAGNOSIS — R110 Nausea: Secondary | ICD-10-CM | POA: Diagnosis not present

## 2022-03-09 DIAGNOSIS — R1013 Epigastric pain: Secondary | ICD-10-CM | POA: Diagnosis not present

## 2022-03-09 DIAGNOSIS — N3289 Other specified disorders of bladder: Secondary | ICD-10-CM | POA: Diagnosis not present

## 2022-03-09 DIAGNOSIS — R109 Unspecified abdominal pain: Secondary | ICD-10-CM | POA: Diagnosis not present

## 2022-03-09 DIAGNOSIS — Z9049 Acquired absence of other specified parts of digestive tract: Secondary | ICD-10-CM | POA: Diagnosis not present

## 2022-03-09 DIAGNOSIS — K3189 Other diseases of stomach and duodenum: Secondary | ICD-10-CM | POA: Diagnosis not present

## 2022-03-10 ENCOUNTER — Telehealth: Payer: Self-pay | Admitting: General Practice

## 2022-03-10 NOTE — Telephone Encounter (Signed)
Transition Care Management Unsuccessful Follow-up Telephone Call  Date of discharge and from where:  03/09/22 from [facility]  Attempts:  1st Attempt  Reason for unsuccessful TCM follow-up call:  Left voice message

## 2022-03-11 DIAGNOSIS — Z8719 Personal history of other diseases of the digestive system: Secondary | ICD-10-CM | POA: Diagnosis not present

## 2022-03-11 DIAGNOSIS — R3121 Asymptomatic microscopic hematuria: Secondary | ICD-10-CM | POA: Diagnosis not present

## 2022-03-11 DIAGNOSIS — Z8711 Personal history of peptic ulcer disease: Secondary | ICD-10-CM | POA: Diagnosis not present

## 2022-03-11 DIAGNOSIS — R1013 Epigastric pain: Secondary | ICD-10-CM | POA: Diagnosis not present

## 2022-03-12 NOTE — Telephone Encounter (Signed)
Transition Care Management Unsuccessful Follow-up Telephone Call  Date of discharge and from where:  03/09/22 from [facility]  Attempts:  2nd Attempt  Reason for unsuccessful TCM follow-up call:  Left voice message

## 2022-03-15 NOTE — Progress Notes (Unsigned)
   Established Patient Office Visit  Subjective   Patient ID: ZAEDA MCFERRAN, female   DOB: 12-13-1980 Age: 41 y.o. MRN: [id number]   No chief complaint on file.   HPI Pleasant 41 year old female presenting today for hospital follow-up discharge after being seen in the emergency room for epigastric pain that radiated through to her back on 03/09/2022.  She was evaluated and thought to have gastritis versus PUD.  She was discharged with as needed Zofran, Protonix 20 mg daily, and Carafate 1 g 4 times daily.  She was also supplied with a small quantity of Norco for pain management.  She has followed up with gastroenterology since her emergency room visit.  They have scheduled her for an upper endoscopy for further evaluation.  She was instructed to continue Protonix with increased dosing to twice daily however they did want her to discontinue Carafate.   Objective:    There were no vitals filed for this visit.  Physical Exam   No results found for this or any previous visit (from the past 24 hour(s)).   {Labs (Optional):23779}  The ASCVD Risk score (Arnett DK, et al., 2019) failed to calculate for the following reasons:   The 2019 ASCVD risk score is only valid for ages 46 to 19   Assessment & Plan:   No problem-specific Assessment & Plan notes found for this encounter.   No follow-ups on file.  ___________________________________________ Clearnce Sorrel, DNP, APRN, FNP-BC [facility] [place]

## 2022-03-15 NOTE — Telephone Encounter (Signed)
Transition Care Management Unsuccessful Follow-up Telephone Call  Date of discharge and from where:  03/09/22 from [facility]  Attempts:  3rd Attempt  Reason for unsuccessful TCM follow-up call:  Left voice message patient has a PCP follow up scheduled for 03/16/22.

## 2022-03-16 ENCOUNTER — Ambulatory Visit (INDEPENDENT_AMBULATORY_CARE_PROVIDER_SITE_OTHER): Payer: BC Managed Care – PPO | Admitting: Medical-Surgical

## 2022-03-16 ENCOUNTER — Encounter: Payer: Self-pay | Admitting: Medical-Surgical

## 2022-03-16 VITALS — BP 107/69 | HR 80 | Resp 20 | Ht 63.0 in | Wt 172.6 lb

## 2022-03-16 DIAGNOSIS — Z09 Encounter for follow-up examination after completed treatment for conditions other than malignant neoplasm: Secondary | ICD-10-CM

## 2022-03-18 ENCOUNTER — Telehealth: Payer: Self-pay

## 2022-03-18 NOTE — Telephone Encounter (Signed)
Leslie Owen has completed the [facility] paperwork. I message the patient through [place].  2 copies were made and the original is placed up front.

## 2022-04-13 DIAGNOSIS — R1013 Epigastric pain: Secondary | ICD-10-CM | POA: Diagnosis not present

## 2022-04-15 DIAGNOSIS — R1013 Epigastric pain: Secondary | ICD-10-CM | POA: Diagnosis not present

## 2022-06-10 DIAGNOSIS — R8290 Unspecified abnormal findings in urine: Secondary | ICD-10-CM | POA: Diagnosis not present

## 2022-06-10 DIAGNOSIS — R7989 Other specified abnormal findings of blood chemistry: Secondary | ICD-10-CM | POA: Diagnosis not present

## 2022-06-10 DIAGNOSIS — R1013 Epigastric pain: Secondary | ICD-10-CM | POA: Diagnosis not present

## 2022-11-15 ENCOUNTER — Other Ambulatory Visit: Payer: Self-pay | Admitting: Nurse Practitioner

## 2022-11-15 DIAGNOSIS — Z1231 Encounter for screening mammogram for malignant neoplasm of breast: Secondary | ICD-10-CM

## 2022-11-17 ENCOUNTER — Ambulatory Visit
Admission: RE | Admit: 2022-11-17 | Discharge: 2022-11-17 | Disposition: A | Discharge status: Home / Self Care | Payer: BC Managed Care – PPO | Source: Ambulatory Visit | Attending: Nurse Practitioner | Admitting: Nurse Practitioner

## 2022-11-17 DIAGNOSIS — Z1231 Encounter for screening mammogram for malignant neoplasm of breast: Secondary | ICD-10-CM

## 2022-11-19 ENCOUNTER — Other Ambulatory Visit: Payer: Self-pay | Admitting: Nurse Practitioner

## 2022-11-19 DIAGNOSIS — R928 Other abnormal and inconclusive findings on diagnostic imaging of breast: Secondary | ICD-10-CM

## 2022-11-30 ENCOUNTER — Ambulatory Visit
Admission: RE | Admit: 2022-11-30 | Discharge: 2022-11-30 | Disposition: A | Discharge status: Home / Self Care | Payer: BC Managed Care – PPO | Source: Ambulatory Visit | Attending: Nurse Practitioner | Admitting: Nurse Practitioner

## 2022-11-30 DIAGNOSIS — N6325 Unspecified lump in the left breast, overlapping quadrants: Secondary | ICD-10-CM | POA: Diagnosis not present

## 2022-11-30 DIAGNOSIS — R928 Other abnormal and inconclusive findings on diagnostic imaging of breast: Secondary | ICD-10-CM | POA: Diagnosis not present

## 2022-12-21 ENCOUNTER — Ambulatory Visit (INDEPENDENT_AMBULATORY_CARE_PROVIDER_SITE_OTHER): Payer: BC Managed Care – PPO | Admitting: Radiology

## 2022-12-21 VITALS — BP 118/78

## 2022-12-21 DIAGNOSIS — N3946 Mixed incontinence: Secondary | ICD-10-CM

## 2022-12-21 DIAGNOSIS — R350 Frequency of micturition: Secondary | ICD-10-CM | POA: Diagnosis not present

## 2022-12-21 DIAGNOSIS — N760 Acute vaginitis: Secondary | ICD-10-CM

## 2022-12-21 DIAGNOSIS — B9689 Other specified bacterial agents as the cause of diseases classified elsewhere: Secondary | ICD-10-CM

## 2022-12-21 LAB — URINALYSIS, COMPLETE W/RFL CULTURE
Bilirubin Urine: NEGATIVE
Hyaline Cast: NONE SEEN /LPF
Ketones, ur: NEGATIVE
Nitrites, Initial: NEGATIVE
Specific Gravity, Urine: 1.005 (ref 1.001–1.035)
pH: 5.5 (ref 5.0–8.0)

## 2022-12-21 LAB — CULTURE INDICATED

## 2022-12-21 MED ORDER — METRONIDAZOLE 0.75 % VA GEL
1.0000 | Freq: Every day | VAGINAL | 0 refills | Status: AC
Start: 2022-12-21 — End: 2022-12-26

## 2022-12-21 NOTE — Progress Notes (Signed)
      SUBJECTIVE: Leslie Owen is a 42 y.o. female who complains of urinary frequency, urgency x 1 week without flank pain, fever, chills, or abnormal vaginal discharge or bleeding. Does report some labial redness. Wears a pad every day for urinary incontinence, mixed. Hx of bladder suspension 10 years ago, states she feels a vaginal pressure again as she did before repair.  Allergies  Allergen Reactions   Levofloxacin Hives    Current Outpatient Medications on File Prior to Visit  Medication Sig Dispense Refill   ELDERBERRY PO Take by mouth.     Multiple Vitamin (MULTIVITAMIN PO) Take by mouth.     No current facility-administered medications on file prior to visit.    Past Medical History:  Diagnosis Date   Anemia    Encounter for diagnostic endoscopy    Gall bladder stones    Migraine    Pulmonary emboli (HCC)    Pulmonary embolus (HCC)    Ulcer    Uterine fibroids affecting pregnancy      OBJECTIVE: Physical Exam Vitals and nursing note reviewed. Exam conducted with a chaperone present.  Constitutional:      Appearance: Normal appearance. She is normal weight.  Cardiovascular:     Rate and Rhythm: Normal rate.  Pulmonary:     Effort: Pulmonary effort is normal.  Genitourinary:    General: Normal vulva.     Adnexa: Right adnexa normal and left adnexa normal.     Comments: +cystocele grade 1 Skin:    General: Skin is warm and dry.  Neurological:     Mental Status: She is alert.  Psychiatric:        Mood and Affect: Mood normal.        Thought Content: Thought content normal.        Judgment: Judgment normal.      No CVA tenderness or inguinal adenopathy noted. Urine dipstick shows positive for RBC's.  Micro exam: 0-5 WBC's per HPF, few+ bacteria, and contaminated specimen with clue cells.    Blood pressure 118/78.    Chaperone offered and declined for exam.  ASSESSMENT/PLAN: 1. Urinary frequency - Urinalysis,Complete w/RFL Culture  2. BV  (bacterial vaginosis) - metroNIDAZOLE (METROGEL) 0.75 % vaginal gel; Place 1 Applicatorful vaginally at bedtime for 5 days.  Dispense: 70 g; Refill: 0  3. Mixed stress and urge urinary incontinence - Ambulatory referral to Physical Therapy    Will send urine culture and sensitivity.  Treatment per orders - also push fluids, avoid bladder irritants. Instructed she may use Pyridium OTC prn. Call or return to clinic prn if these symptoms worsen or fail to improve as anticipated. Pyelo precautions reviewed with patient.   Arlie Solomons B WHNP-BC 11:51 AM 12/21/2022

## 2022-12-22 ENCOUNTER — Other Ambulatory Visit: Payer: Self-pay

## 2022-12-22 ENCOUNTER — Ambulatory Visit: Payer: BC Managed Care – PPO | Attending: Radiology | Admitting: Physical Therapy

## 2022-12-22 DIAGNOSIS — M6281 Muscle weakness (generalized): Secondary | ICD-10-CM | POA: Diagnosis not present

## 2022-12-22 DIAGNOSIS — N3946 Mixed incontinence: Secondary | ICD-10-CM | POA: Diagnosis not present

## 2022-12-22 DIAGNOSIS — R293 Abnormal posture: Secondary | ICD-10-CM | POA: Diagnosis not present

## 2022-12-22 DIAGNOSIS — R279 Unspecified lack of coordination: Secondary | ICD-10-CM | POA: Diagnosis not present

## 2022-12-22 NOTE — Therapy (Signed)
OUTPATIENT PHYSICAL THERAPY FEMALE PELVIC EVALUATION   Patient Name: Leslie Owen MRN: [id number] DOB:[birth year], 42 y.o., female Today's Date: 12/22/2022  END OF SESSION:  PT End of Session - 12/22/22 0802     Visit Number 1    Date for PT Re-Evaluation 03/24/23    Authorization Type [employer]    PT Start Time 0801    PT Stop Time 0838    PT Time Calculation (min) 37 min    Activity Tolerance Patient tolerated treatment well    Behavior During Therapy WFL for tasks assessed/performed             Past Medical History:  Diagnosis Date   Anemia    Encounter for diagnostic endoscopy    Gall bladder stones    Migraine    Pulmonary emboli (HCC)    Pulmonary embolus (HCC)    Ulcer    Uterine fibroids affecting pregnancy    Past Surgical History:  Procedure Laterality Date   ABLATION     ABLATION     BLADDER SUSPENSION     CESAREAN SECTION     CHOLECYSTECTOMY     DIAGNOSTIC LAPAROSCOPY     laporoscopy     MYOMECTOMY     ovarian cysts     PELVIC LAPAROSCOPY     TOTAL ABDOMINAL HYSTERECTOMY     TUBAL LIGATION     UPPER GI ENDOSCOPY     WISDOM TOOTH EXTRACTION     Patient Active Problem List   Diagnosis Date Noted   Ovarian cyst, left 02/17/2015   Breast pain, left 01/22/2015   Female pelvic pain 01/22/2015   Pulmonary embolism (HCC) 10/02/2014   FIBROIDS, UTERUS 05/30/2007   ENDOMETRIOSIS 05/30/2007    PCP: Christen Butter, NP, PCP  REFERRING PROVIDER: Tanda Rockers, NP   REFERRING DIAG: N39.46 (ICD-10-CM) - Mixed stress and urge urinary incontinence  THERAPY DIAG:  Muscle weakness (generalized) - Plan: PT plan of care cert/re-cert  Abnormal posture - Plan: PT plan of care cert/re-cert  Unspecified lack of coordination - Plan: PT plan of care cert/re-cert  Rationale for Evaluation and Treatment: Rehabilitation  ONSET DATE: 1 month  SUBJECTIVE:                                                                                                                                                                                            SUBJECTIVE STATEMENT: Pt had prolapse repair surgery 10 years for she thinks, bladder. And partial hysterectomy 2014.   Fluid intake: Yes: water - 6 bottles day    PAIN:  Are you having pain? Yes "something's about to fall  out". NPRS scale: 5/10 Pain location: Vaginal and lower abdomen  Pain type: dull and heavy Pain description: intermittent   Aggravating factors: full bladder, straining, evenings after being on feet at work all day Relieving factors: emptying bladder sometimes helps, resting for awhile  PRECAUTIONS: None  WEIGHT BEARING RESTRICTIONS: No  FALLS:  Has patient fallen in last 6 months? No  LIVING ENVIRONMENT: Lives with: lives with their family Lives in: House/apartment   OCCUPATION: works at [employer]  PLOF: Independent  PATIENT GOALS: to have less leakage and stronger pelvic floor   PERTINENT HISTORY:  Anemia, Encounter for diagnostic endoscopy, Gall bladder stone,Migraine,Pulmonary emboli, Pulmonary embolus, Ulcer,Uterine fibroids affecting pregnancy   Sexual abuse: No  BOWEL MOVEMENT: Pain with bowel movement: No Type of bowel movement:Type (Bristol Stool Scale) 4, Frequency every two days, and Strain Yes but not consistently Fully empty rectum: Yes:   Leakage: No Pads: No Fiber supplement: No  URINATION: Pain with urination: No Fully empty bladder: Yes: sometimes feels like there is still some there Stream: Strong and Weak Urgency: Yes:   Frequency: sometimes every 20 mins  Leakage: Urge to void and squatting down to pick something up - daily Pads: Yes: sometimes  INTERCOURSE: Pain with intercourse:  not painful Ability to have vaginal penetration:  No Climax: not painful Marinoff Scale: 0/3  PREGNANCY: Vaginal deliveries 1 Tearing Yes: needed stitches  C-section deliveries 2 Currently pregnant No  PROLAPSE: Cystocele     OBJECTIVE:   DIAGNOSTIC  FINDINGS:    COGNITION: Overall cognitive status: Within functional limits for tasks assessed     SENSATION: Light touch: Appears intact Proprioception: Appears intact  MUSCLE LENGTH: Bil hamstrings and adductors limited by 25 %  LUMBAR SPECIAL TESTS:  Single leg stance test: hip drop slightly bil, no pain and able to hold 8s on Lt and 10s on Rt before trunk mobility started   POSTURE: rounded shoulders, forward head, and posterior pelvic tilt  PELVIC ALIGNMENT: WFL  LUMBARAROM/PROM:  A/PROM A/PROM  eval  Flexion WFL  Extension WFL  Right lateral flexion Limited by 25%  Left lateral flexion Limited by 25%  Right rotation WFL  Left rotation WFL   (Blank rows = not tested)  LOWER EXTREMITY ROM: WFL  LOWER EXTREMITY MMT:  Bil hip abduction 3+/5, all other hips grossly 4/5, knees 5/5  PALPATION:   General  scarring from previous c sections noted one transverse and one vertical, very restricted tissue mobility at transection of two scars with TTP, also TTP over bladder and in bil groin                External Perineal Exam deferred, pt awaiting possible BV treatment                             Internal Pelvic Floor  deferred, pt awaiting possible BV treatment  Patient confirms identification and approves PT to assess internal pelvic floor and treatment Yes - if (-) for BV or once treatment completed   PELVIC MMT:   MMT eval  Vaginal   Internal Anal Sphincter   External Anal Sphincter   Puborectalis   Diastasis Recti   (Blank rows = not tested)        TONE:  deferred, pt awaiting possible BV treatment  PROLAPSE:  deferred, pt awaiting possible BV treatment Per MD note stage one cystocele present at recent exam  TODAY'S TREATMENT:  DATE:   12/22/22 EVAL Examination completed, findings reviewed, pt educated on POC, prolapse relief  positions, voiding and breathing mechanics. Pt motivated to participate in PT and agreeable to attempt recommendations.     PATIENT EDUCATION:  Education details: prolapse relief positions, voiding and breathing mechanics Person educated: Patient Education method: Explanation, Demonstration, Tactile cues, Verbal cues, and Handouts Education comprehension: verbalized understanding and returned demonstration  HOME EXERCISE PROGRAM: To be given  ASSESSMENT:  CLINICAL IMPRESSION: Patient is a 42 y.o. female  who was seen today for physical therapy evaluation and treatment for urinary leakage, prolapse symptoms with previous prolapse repair 2014. Pt found to have decreased flexibility in spine and hips, decreased core and hip strength, poor tissue mobility at c-section scarring sites with TTP here and over bladder and bil groin. Pt did consent to internal assessment as able but awaiting on results for BV. Once cleared will do internal. Pt educated on voiding mechanics to decreased about of straining for urine and bowels, step stool use for voiding, breathing mechanics, and prolapse relief positions to decreased strain at pelvic floor. Pt would benefit from additional PT to further address deficits.    OBJECTIVE IMPAIRMENTS: decreased coordination, decreased endurance, decreased strength, increased fascial restrictions, increased muscle spasms, impaired flexibility, improper body mechanics, postural dysfunction, and pain.   ACTIVITY LIMITATIONS: carrying, lifting, standing, squatting, continence, and locomotion level  PARTICIPATION LIMITATIONS: community activity  PERSONAL FACTORS: Time since onset of injury/illness/exacerbation and 1 comorbidity: x3 pregnancies 1 vaginal and 2 c-sections  are also affecting patient's functional outcome.   REHAB POTENTIAL: Good  CLINICAL DECISION MAKING: Stable/uncomplicated  EVALUATION COMPLEXITY: Low   GOALS: Goals reviewed with patient? Yes  SHORT  TERM GOALS: Target date: 01/19/23  Pt to be I with HEP.  Baseline: Goal status: INITIAL  2.  Pt to demonstrate at least 2/5 pelvic floor strength for improved pelvic stability and decreased strain at pelvic floor/ decrease leakage.  Baseline:  Goal status: INITIAL  3.  Pt to be I with voiding and breathing mechanics to decreased strain at pelvic floor and prolapse. Baseline:  Goal status: INITIAL   LONG TERM GOALS: Target date: 03/24/23  Pt to be I with advanced HEP.  Baseline:  Goal status: INITIAL  2.  Pt to demonstrate at least 3/5 pelvic floor strength for improved pelvic stability and decreased strain at pelvic floor/ decrease leakage.  Baseline:  Goal status: INITIAL  3.  Pt will have 50% less urgency due to bladder retraining and strengthening  Baseline:  Goal status: INITIAL  4.  Pt will report her bladder voids are complete due to improved evacuation techniques.  Baseline:  Goal status: INITIAL  5.  Pt to demonstrate at least 5/5 bil hip strength for improved pelvic stability and functional squats without leakage.  Baseline:  Goal status: INITIAL  6.  Pt to demonstrate improved coordination of pelvic floor and breathing mechanics with 20# squat with appropriate synergistic patterns to decrease pain and leakage at least 75% of the time.    Baseline:  Goal status: INITIAL  PLAN:  PT FREQUENCY: 1x/week  PT DURATION:  8 sessions  PLANNED INTERVENTIONS: Therapeutic exercises, Therapeutic activity, Neuromuscular re-education, Patient/Family education, Self Care, Joint mobilization, Dry Needling, Electrical stimulation, Spinal mobilization, Cryotherapy, Moist heat, scar mobilization, Taping, Biofeedback, and Manual therapy  PLAN FOR NEXT SESSION: internal if able and pt consents, pelvic floor/core/hip strengthening, stretching core and hips and spine, coordination of breathing and pelvic floor with strengthening exercises, urge drill, knack,  Otelia Sergeant, PT,  DPT 05/29/248:55 AM

## 2022-12-22 NOTE — Patient Instructions (Signed)
   BALLOON BREATHING:

## 2022-12-23 LAB — URINALYSIS, COMPLETE W/RFL CULTURE
Casts: NONE SEEN /LPF
Crystals: NONE SEEN /HPF
Glucose, UA: NEGATIVE
Protein, ur: NEGATIVE
RBC / HPF: NONE SEEN /HPF (ref 0–2)
Yeast: NONE SEEN /HPF

## 2022-12-23 LAB — URINE CULTURE
MICRO NUMBER:: 15008042
Result:: NO GROWTH
SPECIMEN QUALITY:: ADEQUATE

## 2022-12-27 DIAGNOSIS — G43901 Migraine, unspecified, not intractable, with status migrainosus: Secondary | ICD-10-CM | POA: Diagnosis not present

## 2022-12-27 DIAGNOSIS — G43809 Other migraine, not intractable, without status migrainosus: Secondary | ICD-10-CM | POA: Diagnosis not present

## 2022-12-27 DIAGNOSIS — R202 Paresthesia of skin: Secondary | ICD-10-CM | POA: Diagnosis not present

## 2022-12-27 DIAGNOSIS — R519 Headache, unspecified: Secondary | ICD-10-CM | POA: Diagnosis not present

## 2022-12-27 DIAGNOSIS — R200 Anesthesia of skin: Secondary | ICD-10-CM | POA: Diagnosis not present

## 2022-12-27 DIAGNOSIS — Z79899 Other long term (current) drug therapy: Secondary | ICD-10-CM | POA: Diagnosis not present

## 2022-12-28 ENCOUNTER — Telehealth: Payer: Self-pay | Admitting: General Practice

## 2022-12-28 NOTE — Transitions of Care (Post Inpatient/ED Visit) (Signed)
   12/28/2022  Name: KESSLEY RUGAR MRN: [id number] DOB: [birth year]  Today's TOC FU Call Status: Today's TOC FU Call Status:: Unsuccessul Call (1st Attempt) Unsuccessful Call (1st Attempt) Date: 12/28/22  Attempted to reach the patient regarding the most recent Inpatient/ED visit.  Follow Up Plan: Additional outreach attempts will be made to reach the patient to complete the Transitions of Care (Post Inpatient/ED visit) call.   Signature [place]esto Charon, Control and instrumentation engineer

## 2022-12-29 NOTE — Transitions of Care (Post Inpatient/ED Visit) (Signed)
   12/29/2022  Name: Leslie Owen MRN: [id number] DOB: 02-Dec-1980  Today's TOC FU Call Status: Today's TOC FU Call Status:: Unsuccessful Call (2nd Attempt) Unsuccessful Call (1st Attempt) Date: 12/28/22 Unsuccessful Call (2nd Attempt) Date: 12/29/22  Attempted to reach the patient regarding the most recent Inpatient/ED visit.  Follow Up Plan: Additional outreach attempts will be made to reach the patient to complete the Transitions of Care (Post Inpatient/ED visit) call.   Signature [place] Charon, Control and instrumentation engineer

## 2023-01-03 ENCOUNTER — Ambulatory Visit: Payer: BC Managed Care – PPO | Attending: Radiology | Admitting: Physical Therapy

## 2023-01-03 DIAGNOSIS — M6281 Muscle weakness (generalized): Secondary | ICD-10-CM | POA: Insufficient documentation

## 2023-01-03 DIAGNOSIS — R293 Abnormal posture: Secondary | ICD-10-CM | POA: Diagnosis not present

## 2023-01-03 DIAGNOSIS — R279 Unspecified lack of coordination: Secondary | ICD-10-CM | POA: Diagnosis not present

## 2023-01-03 NOTE — Transitions of Care (Post Inpatient/ED Visit) (Signed)
   01/03/2023  Name: Leslie Owen MRN: [id number] DOB: 1981-06-15  Today's TOC FU Call Status: Today's TOC FU Call Status:: Unsuccessful Call (3rd Attempt) Unsuccessful Call (1st Attempt) Date: 12/28/22 Unsuccessful Call (2nd Attempt) Date: 12/29/22 Unsuccessful Call (3rd Attempt) Date: 01/03/23  Attempted to reach the patient regarding the most recent Inpatient/ED visit.  Follow Up Plan: No further outreach attempts will be made at this time. We have been unable to contact the patient.  Signature [place]esto Charon, Control and instrumentation engineer

## 2023-01-03 NOTE — Patient Instructions (Signed)
Urge Incontinence  Ideal urination frequency is every 2-4 wakeful hours, which equates to 5-8 times within a 24-hour period.   Urge incontinence is leakage that occurs when the bladder muscle contracts, creating a sudden need to go before getting to the bathroom.   Going too often when your bladder isn't actually full can disrupt the body's automatic signals to store and hold urine longer, which will increase urgency/frequency.  In this case, the bladder "is running the show" and strategies can be learned to retrain this pattern.   One should be able to control the first urge to urinate, at around 150mL.  The bladder can hold up to a "grande latte," or 400mL. To help you gain control, practice the Urge Drill below when urgency strikes.  This drill will help retrain your bladder signals and allow you to store and hold urine longer.  The overall goal is to stretch out your time between voids to reach a more manageable voiding schedule.    Practice your "quick flicks" often throughout the day (each waking hour) even when you don't need feel the urge to go.  This will help strengthen your pelvic floor muscles, making them more effective in controlling leakage.  Urge Drill  When you feel an urge to go, follow these steps to regain control: Stop what you are doing and be still Take one deep breath, directing your air into your abdomen Think an affirming thought, such as "I've got this." Do 5 quick flicks of your pelvic floor Walk with control to the bathroom to void, or delay voiding   Bladder Irritants  Certain foods and beverages can be irritating to the bladder.  Avoiding these irritants may decrease your symptoms of urinary urgency, frequency or bladder pain.  Even reducing your intake can help with your symptoms.  Not everyone is sensitive to all bladder irritants, so you may consider focusing on one irritant at a time, removing or reducing your intake of that irritant for 7-10 days to see if  this change helps your symptoms.  Water intake is also very important.  Below is a list of bladder irritants.  Drinks: alcohol, carbonated beverages, caffeinated beverages such as coffee and tea, drinks with artificial sweeteners, citrus juices, apple juice, tomato juice  Foods: tomatoes and tomato based foods, spicy food, sugar and artificial sweeteners, vinegar, chocolate, raw onion, apples, citrus fruits, pineapple, cranberries, tomatoes, strawberries, plums, peaches, cantaloupe  Other: acidic urine (too concentrated) - see water intake info below  Substitutes you can try that are NOT irritating to the bladder: cooked onion, pears, papayas, sun-brewed decaf teas, watermelons, non-citrus herbal teas, apricots, kava and low-acid instant drinks (Postum).    WATER INTAKE: Remember to drink lots of water (aim for fluid intake of half your body weight with 2/3 of fluids being water).  You may be limiting fluids due to fear of leakage, but this can actually worsen urgency symptoms due to highly concentrated urine.  Water helps balance the pH of your urine so it doesn't become too acidic - acidic urine is a bladder irritant!    

## 2023-01-03 NOTE — Therapy (Signed)
OUTPATIENT PHYSICAL THERAPY FEMALE PELVIC Treatment   Patient Name: ILONA PEPPERS MRN: [id number] DOB:07/24/1981, 42 y.o., female Today's Date: 01/03/2023  END OF SESSION:  PT End of Session - 01/03/23 0808     Visit Number 2    Date for PT Re-Evaluation 03/24/23    Authorization Type BCBS    PT Start Time 0807   arrival   PT Stop Time 0845    PT Time Calculation (min) 38 min    Activity Tolerance Patient tolerated treatment well    Behavior During Therapy WFL for tasks assessed/performed              Past Medical History:  Diagnosis Date   Anemia    Encounter for diagnostic endoscopy    Gall bladder stones    Migraine    Pulmonary emboli (HCC)    Pulmonary embolus (HCC)    Ulcer    Uterine fibroids affecting pregnancy    Past Surgical History:  Procedure Laterality Date   ABLATION     ABLATION     BLADDER SUSPENSION     CESAREAN SECTION     CHOLECYSTECTOMY     DIAGNOSTIC LAPAROSCOPY     laporoscopy     MYOMECTOMY     ovarian cysts     PELVIC LAPAROSCOPY     TOTAL ABDOMINAL HYSTERECTOMY     TUBAL LIGATION     UPPER GI ENDOSCOPY     WISDOM TOOTH EXTRACTION     Patient Active Problem List   Diagnosis Date Noted   Ovarian cyst, left 02/17/2015   Breast pain, left 01/22/2015   Female pelvic pain 01/22/2015   Pulmonary embolism (HCC) 10/02/2014   FIBROIDS, UTERUS 05/30/2007   ENDOMETRIOSIS 05/30/2007    PCP: Christen Butter, NP, PCP  REFERRING PROVIDER: Tanda Rockers, NP   REFERRING DIAG: N39.46 (ICD-10-CM) - Mixed stress and urge urinary incontinence  THERAPY DIAG:  Muscle weakness (generalized)  Abnormal posture  Unspecified lack of coordination  Rationale for Evaluation and Treatment: Rehabilitation  ONSET DATE: 1 month  SUBJECTIVE:                                                                                                                                                                                           SUBJECTIVE  STATEMENT: Pt had prolapse repair surgery 10 years for she thinks, bladder. And partial hysterectomy 2014.   Fluid intake: Yes: water - 6 bottles day    PAIN:  Are you having pain? Yes "something's about to fall out". NPRS scale: 5/10 Pain location: Vaginal and lower abdomen  Pain type: dull and heavy Pain description:  intermittent   Aggravating factors: full bladder, straining, evenings after being on feet at work all day Relieving factors: emptying bladder sometimes helps, resting for awhile  PRECAUTIONS: None  WEIGHT BEARING RESTRICTIONS: No  FALLS:  Has patient fallen in last 6 months? No  LIVING ENVIRONMENT: Lives with: lives with their family Lives in: House/apartment   OCCUPATION: works at [employer]  PLOF: Independent  PATIENT GOALS: to have less leakage and stronger pelvic floor   PERTINENT HISTORY:  Anemia, Encounter for diagnostic endoscopy, Gall bladder stone,Migraine,Pulmonary emboli, Pulmonary embolus, Ulcer,Uterine fibroids affecting pregnancy   Sexual abuse: No  BOWEL MOVEMENT: Pain with bowel movement: No Type of bowel movement:Type (Bristol Stool Scale) 4, Frequency every two days, and Strain Yes but not consistently Fully empty rectum: Yes:   Leakage: No Pads: No Fiber supplement: No  URINATION: Pain with urination: No Fully empty bladder: Yes: sometimes feels like there is still some there Stream: Strong and Weak Urgency: Yes:   Frequency: sometimes every 20 mins  Leakage: Urge to void and squatting down to pick something up - daily Pads: Yes: sometimes  INTERCOURSE: Pain with intercourse:  not painful Ability to have vaginal penetration:  No Climax: not painful Marinoff Scale: 0/3  PREGNANCY: Vaginal deliveries 1 Tearing Yes: needed stitches  C-section deliveries 2 Currently pregnant No  PROLAPSE: Cystocele     OBJECTIVE:   DIAGNOSTIC FINDINGS:    COGNITION: Overall cognitive status: Within functional limits for tasks  assessed     SENSATION: Light touch: Appears intact Proprioception: Appears intact  MUSCLE LENGTH: Bil hamstrings and adductors limited by 25 %  LUMBAR SPECIAL TESTS:  Single leg stance test: hip drop slightly bil, no pain and able to hold 8s on Lt and 10s on Rt before trunk mobility started   POSTURE: rounded shoulders, forward head, and posterior pelvic tilt  PELVIC ALIGNMENT: WFL  LUMBARAROM/PROM:  A/PROM A/PROM  eval  Flexion WFL  Extension WFL  Right lateral flexion Limited by 25%  Left lateral flexion Limited by 25%  Right rotation WFL  Left rotation WFL   (Blank rows = not tested)  LOWER EXTREMITY ROM: WFL  LOWER EXTREMITY MMT:  Bil hip abduction 3+/5, all other hips grossly 4/5, knees 5/5  PALPATION:   General  scarring from previous c sections noted one transverse and one vertical, very restricted tissue mobility at transection of two scars with TTP, also TTP over bladder and in bil groin                External Perineal Exam deferred, pt awaiting possible BV treatment                             Internal Pelvic Floor  deferred, pt awaiting possible BV treatment  Patient confirms identification and approves PT to assess internal pelvic floor and treatment Yes - if (-) for BV or once treatment completed   PELVIC MMT:   MMT eval  Vaginal   Internal Anal Sphincter   External Anal Sphincter   Puborectalis   Diastasis Recti   (Blank rows = not tested)        TONE:  deferred, pt awaiting possible BV treatment  PROLAPSE:  deferred, pt awaiting possible BV treatment Per MD note stage one cystocele present at recent exam  TODAY'S TREATMENT:  DATE:   01/03/23: NMRE:all exercises cued for breathing mechanics and core activation and pelvic floor activation as able coordinated with exercises for improved pelvic floor strength and  coordination for decreased leakage Hooklying ball squeezes 2x10 Bridges with ball squeezes 2x10  Opp hand and knee ball press 2x10 Mini squats 2x10 Palloff green band 2x10 each Dead lifts 2x10 10# each hand 2x10 bear planks   PATIENT EDUCATION:  Education details: prolapse relief positions, voiding and breathing mechanics  [id number] Person educated: Patient Education method: Explanation, Demonstration, Tactile cues, Verbal cues, and Handouts Education comprehension: verbalized understanding and returned demonstration  HOME EXERCISE PROGRAM: [id number]  ASSESSMENT:  CLINICAL IMPRESSION: Patient presents for treatment denies change in symptoms. Pt tolerated session well with focus being on coordination of pelvic floor and breathing with strengthening exercises to decreased leakage and strain at pelvic floor. Pt benefited from cues for technique and coordination throughout. Pt would benefit from additional PT to further address deficits.    OBJECTIVE IMPAIRMENTS: decreased coordination, decreased endurance, decreased strength, increased fascial restrictions, increased muscle spasms, impaired flexibility, improper body mechanics, postural dysfunction, and pain.   ACTIVITY LIMITATIONS: carrying, lifting, standing, squatting, continence, and locomotion level  PARTICIPATION LIMITATIONS: community activity  PERSONAL FACTORS: Time since onset of injury/illness/exacerbation and 1 comorbidity: x3 pregnancies 1 vaginal and 2 c-sections  are also affecting patient's functional outcome.   REHAB POTENTIAL: Good  CLINICAL DECISION MAKING: Stable/uncomplicated  EVALUATION COMPLEXITY: Low   GOALS: Goals reviewed with patient? Yes  SHORT TERM GOALS: Target date: 01/19/23  Pt to be I with HEP.  Baseline: Goal status: INITIAL  2.  Pt to demonstrate at least 2/5 pelvic floor strength for improved pelvic stability and decreased strain at pelvic floor/ decrease leakage.  Baseline:  Goal status:  INITIAL  3.  Pt to be I with voiding and breathing mechanics to decreased strain at pelvic floor and prolapse. Baseline:  Goal status: INITIAL   LONG TERM GOALS: Target date: 03/24/23  Pt to be I with advanced HEP.  Baseline:  Goal status: INITIAL  2.  Pt to demonstrate at least 3/5 pelvic floor strength for improved pelvic stability and decreased strain at pelvic floor/ decrease leakage.  Baseline:  Goal status: INITIAL  3.  Pt will have 50% less urgency due to bladder retraining and strengthening  Baseline:  Goal status: INITIAL  4.  Pt will report her bladder voids are complete due to improved evacuation techniques.  Baseline:  Goal status: INITIAL  5.  Pt to demonstrate at least 5/5 bil hip strength for improved pelvic stability and functional squats without leakage.  Baseline:  Goal status: INITIAL  6.  Pt to demonstrate improved coordination of pelvic floor and breathing mechanics with 20# squat with appropriate synergistic patterns to decrease pain and leakage at least 75% of the time.    Baseline:  Goal status: INITIAL  PLAN:  PT FREQUENCY: 1x/week  PT DURATION:  8 sessions  PLANNED INTERVENTIONS: Therapeutic exercises, Therapeutic activity, Neuromuscular re-education, Patient/Family education, Self Care, Joint mobilization, Dry Needling, Electrical stimulation, Spinal mobilization, Cryotherapy, Moist heat, scar mobilization, Taping, Biofeedback, and Manual therapy  PLAN FOR NEXT SESSION: internal if able and pt consents, pelvic floor/core/hip strengthening, stretching core and hips and spine, coordination of breathing and pelvic floor with strengthening exercises, urge drill, knack,   Otelia Sergeant, PT, DPT 06/10/249:28 AM

## 2023-01-05 ENCOUNTER — Encounter: Payer: Self-pay | Admitting: Radiology

## 2023-01-05 ENCOUNTER — Ambulatory Visit (INDEPENDENT_AMBULATORY_CARE_PROVIDER_SITE_OTHER): Payer: BC Managed Care – PPO | Admitting: Radiology

## 2023-01-05 VITALS — BP 112/76

## 2023-01-05 DIAGNOSIS — N761 Subacute and chronic vaginitis: Secondary | ICD-10-CM | POA: Diagnosis not present

## 2023-01-05 DIAGNOSIS — R300 Dysuria: Secondary | ICD-10-CM

## 2023-01-05 LAB — WET PREP FOR TRICH, YEAST, CLUE

## 2023-01-05 LAB — URINALYSIS, COMPLETE W/RFL CULTURE
Bacteria, UA: NONE SEEN /HPF
Bilirubin Urine: NEGATIVE
Glucose, UA: NEGATIVE
Hgb urine dipstick: NEGATIVE
Hyaline Cast: NONE SEEN /LPF
Ketones, ur: NEGATIVE
Leukocyte Esterase: NEGATIVE
Nitrites, Initial: NEGATIVE
Protein, ur: NEGATIVE
RBC / HPF: NONE SEEN /HPF (ref 0–2)
Specific Gravity, Urine: 1.01 (ref 1.001–1.035)
WBC, UA: NONE SEEN /HPF (ref 0–5)
pH: 6.5 (ref 5.0–8.0)

## 2023-01-05 LAB — NO CULTURE INDICATED

## 2023-01-05 MED ORDER — TERCONAZOLE 0.8 % VA CREA
1.0000 | TOPICAL_CREAM | Freq: Every day | VAGINAL | 0 refills | Status: DC
Start: 2023-01-05 — End: 2023-07-12

## 2023-01-05 NOTE — Progress Notes (Signed)
      Subjective: Leslie Owen is a 42 y.o. female who complains of dysuria, frequency, urgency, vulvar irritation, vaginal discharge, itching, no odor. Symptoms began after finishing course of Metrogel after last office visit 12/21/22.    Review of Systems  All other systems reviewed and are negative.   Past Medical History:  Diagnosis Date   Anemia    Encounter for diagnostic endoscopy    Gall bladder stones    Migraine    Pulmonary emboli (HCC)    Pulmonary embolus (HCC)    Ulcer    Uterine fibroids affecting pregnancy       Objective:  Today's Vitals   01/05/23 1411  BP: 112/76   There is no height or weight on file to calculate BMI.   -General: no acute distress -Vulva: without lesions or discharge -Vagina: discharge present, aptima swab and wet prep obtained -Cervix: no lesion or discharge, no CMT -Perineum: no lesions -Uterus: Mobile, non tender -Adnexa: no masses or tenderness  Urine dipstick shows negative for all components.  Micro exam: negative for WBC's or RBC's.  Microscopic wet-mount exam shows hyphae.   Raynelle Fanning, CMA present for exam  Assessment:/Plan:   1. Dysuria - Urinalysis,Complete w/RFL Culture  2. Subacute vaginitis - WET PREP FOR TRICH, YEAST, CLUE  + yeast Rx sent for Terazol  Will contact patient with results of testing completed today. Avoid intercourse until symptoms are resolved. Safe sex encouraged. Avoid the use of soaps or perfumed products in the peri area. Avoid tub baths and sitting in sweaty or wet clothing for prolonged periods of time.

## 2023-01-18 ENCOUNTER — Ambulatory Visit: Payer: BC Managed Care – PPO | Admitting: Physical Therapy

## 2023-01-18 DIAGNOSIS — M6281 Muscle weakness (generalized): Secondary | ICD-10-CM | POA: Diagnosis not present

## 2023-01-18 DIAGNOSIS — R293 Abnormal posture: Secondary | ICD-10-CM

## 2023-01-18 DIAGNOSIS — R279 Unspecified lack of coordination: Secondary | ICD-10-CM

## 2023-01-18 NOTE — Therapy (Addendum)
OUTPATIENT PHYSICAL THERAPY FEMALE PELVIC Treatment   Patient Name: Leslie Owen MRN: [id number] DOB:September 10, 1980, 42 y.o., female Today's Date: 01/18/2023  END OF SESSION:  PT End of Session - 01/18/23 1150     Visit Number 3    Date for PT Re-Evaluation 03/24/23    Authorization Type [employer]BS    PT Start Time 1146    PT Stop Time 1226    PT Time Calculation (min) 40 min    Activity Tolerance Patient tolerated treatment well    Behavior During Therapy WFL for tasks assessed/performed              Past Medical History:  Diagnosis Date   Anemia    Encounter for diagnostic endoscopy    Gall bladder stones    Migraine    Pulmonary emboli (HCC)    Pulmonary embolus (HCC)    Ulcer    Uterine fibroids affecting pregnancy    Past Surgical History:  Procedure Laterality Date   ABLATION     ABLATION     BLADDER SUSPENSION     CESAREAN SECTION     CHOLECYSTECTOMY     DIAGNOSTIC LAPAROSCOPY     laporoscopy     MYOMECTOMY     ovarian cysts     PELVIC LAPAROSCOPY     TOTAL ABDOMINAL HYSTERECTOMY     TUBAL LIGATION     UPPER GI ENDOSCOPY     WISDOM TOOTH EXTRACTION     Patient Active Problem List   Diagnosis Date Noted   Ovarian cyst, left 02/17/2015   Breast pain, left 01/22/2015   Female pelvic pain 01/22/2015   Pulmonary embolism (HCC) 10/02/2014   FIBROIDS, UTERUS 05/30/2007   ENDOMETRIOSIS 05/30/2007    PCP: Christen Butter, NP, PCP  REFERRING PROVIDER: Tanda Rockers, NP   REFERRING DIAG: N39.46 (ICD-10-CM) - Mixed stress and urge urinary incontinence  THERAPY DIAG:  Muscle weakness (generalized)  Abnormal posture  Unspecified lack of coordination  Rationale for Evaluation and Treatment: Rehabilitation  ONSET DATE: 1 month  SUBJECTIVE:                                                                                                                                                                                           SUBJECTIVE  STATEMENT: Similar symptoms to eval and last session.    Fluid intake: Yes: water - 6 bottles day    PAIN:  Are you having pain? Yes "something's about to fall out". NPRS scale: 5/10 Pain location: Vaginal and lower abdomen  Pain type: dull and heavy Pain description: intermittent   Aggravating factors: full bladder, straining, evenings  after being on feet at work all day Relieving factors: emptying bladder sometimes helps, resting for awhile  PRECAUTIONS: None  WEIGHT BEARING RESTRICTIONS: No  FALLS:  Has patient fallen in last 6 months? No  LIVING ENVIRONMENT: Lives with: lives with their family Lives in: House/apartment   OCCUPATION: works at [employer]  PLOF: Independent  PATIENT GOALS: to have less leakage and stronger pelvic floor   PERTINENT HISTORY:  Anemia, Encounter for diagnostic endoscopy, Gall bladder stone,Migraine,Pulmonary emboli, Pulmonary embolus, Ulcer,Uterine fibroids affecting pregnancy   Sexual abuse: No  BOWEL MOVEMENT: Pain with bowel movement: No Type of bowel movement:Type (Bristol Stool Scale) 4, Frequency every two days, and Strain Yes but not consistently Fully empty rectum: Yes:   Leakage: No Pads: No Fiber supplement: No  URINATION: Pain with urination: No Fully empty bladder: Yes: sometimes feels like there is still some there Stream: Strong and Weak Urgency: Yes:   Frequency: sometimes every 20 mins  Leakage: Urge to void and squatting down to pick something up - daily Pads: Yes: sometimes  INTERCOURSE: Pain with intercourse:  not painful Ability to have vaginal penetration:  No Climax: not painful Marinoff Scale: 0/3  PREGNANCY: Vaginal deliveries 1 Tearing Yes: needed stitches  C-section deliveries 2 Currently pregnant No  PROLAPSE: Cystocele     OBJECTIVE:   DIAGNOSTIC FINDINGS:    COGNITION: Overall cognitive status: Within functional limits for tasks assessed     SENSATION: Light touch: Appears  intact Proprioception: Appears intact  MUSCLE LENGTH: Bil hamstrings and adductors limited by 25 %  LUMBAR SPECIAL TESTS:  Single leg stance test: hip drop slightly bil, no pain and able to hold 8s on Lt and 10s on Rt before trunk mobility started   POSTURE: rounded shoulders, forward head, and posterior pelvic tilt  PELVIC ALIGNMENT: WFL  LUMBARAROM/PROM:  A/PROM A/PROM  eval  Flexion WFL  Extension WFL  Right lateral flexion Limited by 25%  Left lateral flexion Limited by 25%  Right rotation WFL  Left rotation WFL   (Blank rows = not tested)  LOWER EXTREMITY ROM: WFL  LOWER EXTREMITY MMT:  Bil hip abduction 3+/5, all other hips grossly 4/5, knees 5/5  PALPATION:   General  scarring from previous c sections noted one transverse and one vertical, very restricted tissue mobility at transection of two scars with TTP, also TTP over bladder and in bil groin                External Perineal Exam deferred, pt awaiting possible BV treatment                             Internal Pelvic Floor  deferred, pt awaiting possible BV treatment  Patient confirms identification and approves PT to assess internal pelvic floor and treatment Yes - if (-) for BV or once treatment completed  01/18/23 yes PELVIC MMT:   MMT 01/18/23   Vaginal 2/5, 4s, 1 reps  Internal Anal Sphincter   External Anal Sphincter   Puborectalis   Diastasis Recti   (Blank rows = not tested)        TONE:  deferred, pt awaiting possible BV treatment 01/18/23   PROLAPSE:  deferred, pt awaiting possible BV treatment Per MD note stage one cystocele present at recent exam  01/18/23 possible grade   TODAY'S TREATMENT:  DATE:   01/03/23: NMRE:all exercises cued for breathing mechanics and core activation and pelvic floor activation as able coordinated with exercises for improved pelvic  floor strength and coordination for decreased leakage Hooklying ball squeezes 2x10 Bridges with ball squeezes 2x10  Opp hand and knee ball press 2x10 Mini squats 2x10 Palloff green band 2x10 each Dead lifts 2x10 10# each hand 2x10 bear planks  01/18/23; Patient consented to internal pelvic floor assessment vaginally this date and found to have decreased strength, endurance, and coordination. Patient benefited from verbal cues for improved technique with pelvic floor contractions with pt initially demonstrating no mobility with attempts to contract, then demonstrated bulge/bearing down at pelvic floor with attempt to contract. This only occurred once with pt benefiting from various verbal cues and extra time, reps for improved technique. Best aid for pt was NMRE tapping for improved muscle activation. Pt reports she could feel pelvic floor a little better with improved reps of contractions. Findings above. 2x10 pelvic floor contractions completed with tapping, more superficial muscle layer active than deep. Unable to complete quick flicks, and able to complete one rep of isometric of 2/5 strength for 4s then decreased to 1/5 for additional 5 s.  Pt educated on ways to feel pelvic floor contractions better at home, educated on placing hand at vaginal opening externally for improved feedback for contraction and relaxation, also kegel weights for improved strength and feedback of pelvic floor.   PATIENT EDUCATION:  Education details: prolapse relief positions, voiding and breathing mechanics  [id number] Person educated: Patient Education method: Explanation, Demonstration, Tactile cues, Verbal cues, and Handouts Education comprehension: verbalized understanding and returned demonstration  HOME EXERCISE PROGRAM: [id number]  ASSESSMENT:  CLINICAL IMPRESSION: Patient presents for treatment denies change in symptoms. Pt session focused on internal vaginal assessment and treatment, pt demonstrates pelvic  floor weakness, decreased coordination and endurance, and needed max verbal cues and extra time to complete contractions without compensation, coordinated with breathing, and no bearing down. Pt would benefit from additional PT to further address deficits.    OBJECTIVE IMPAIRMENTS: decreased coordination, decreased endurance, decreased strength, increased fascial restrictions, increased muscle spasms, impaired flexibility, improper body mechanics, postural dysfunction, and pain.   ACTIVITY LIMITATIONS: carrying, lifting, standing, squatting, continence, and locomotion level  PARTICIPATION LIMITATIONS: community activity  PERSONAL FACTORS: Time since onset of injury/illness/exacerbation and 1 comorbidity: x3 pregnancies 1 vaginal and 2 c-sections  are also affecting patient's functional outcome.   REHAB POTENTIAL: Good  CLINICAL DECISION MAKING: Stable/uncomplicated  EVALUATION COMPLEXITY: Low   GOALS: Goals reviewed with patient? Yes  SHORT TERM GOALS: Target date: 01/19/23  Pt to be I with HEP.  Baseline: Goal status: INITIAL  2.  Pt to demonstrate at least 2/5 pelvic floor strength for improved pelvic stability and decreased strain at pelvic floor/ decrease leakage.  Baseline:  Goal status: INITIAL  3.  Pt to be I with voiding and breathing mechanics to decreased strain at pelvic floor and prolapse. Baseline:  Goal status: INITIAL   LONG TERM GOALS: Target date: 03/24/23  Pt to be I with advanced HEP.  Baseline:  Goal status: INITIAL  2.  Pt to demonstrate at least 3/5 pelvic floor strength for improved pelvic stability and decreased strain at pelvic floor/ decrease leakage.  Baseline:  Goal status: INITIAL  3.  Pt will have 50% less urgency due to bladder retraining and strengthening  Baseline:  Goal status: INITIAL  4.  Pt will report her bladder voids are complete due to improved  evacuation techniques.  Baseline:  Goal status: INITIAL  5.  Pt to demonstrate at  least 5/5 bil hip strength for improved pelvic stability and functional squats without leakage.  Baseline:  Goal status: INITIAL  6.  Pt to demonstrate improved coordination of pelvic floor and breathing mechanics with 20# squat with appropriate synergistic patterns to decrease pain and leakage at least 75% of the time.    Baseline:  Goal status: INITIAL  PLAN:  PT FREQUENCY: 1x/week  PT DURATION:  8 sessions  PLANNED INTERVENTIONS: Therapeutic exercises, Therapeutic activity, Neuromuscular re-education, Patient/Family education, Self Care, Joint mobilization, Dry Needling, Electrical stimulation, Spinal mobilization, Cryotherapy, Moist heat, scar mobilization, Taping, Biofeedback, and Manual therapy  PLAN FOR NEXT SESSION: internal if able and pt consents, pelvic floor/core/hip strengthening, stretching core and hips and spine, coordination of breathing and pelvic floor with strengthening exercises, urge drill, knack,   Otelia Sergeant, PT, DPT 01/18/2411:30 PM   PHYSICAL THERAPY DISCHARGE SUMMARY  Visits from Start of Care: 3  Current functional level related to goals / functional outcomes: Pt unable to return to therapy at this time   Remaining deficits: Unable to formally assess as pt unable to return at this time   Education / Equipment: HEP   Patient agrees to discharge. Patient goals were partially met. Patient is being discharged due to not returning since the last visit.  Otelia Sergeant, PT, DPT 08/05/248:18 AM

## 2023-01-25 ENCOUNTER — Ambulatory Visit
Admission: EM | Admit: 2023-01-25 | Discharge: 2023-01-25 | Disposition: A | Discharge status: Home / Self Care | Payer: BC Managed Care – PPO | Attending: Family Medicine | Admitting: Family Medicine

## 2023-01-25 DIAGNOSIS — H60391 Other infective otitis externa, right ear: Secondary | ICD-10-CM | POA: Diagnosis not present

## 2023-01-25 DIAGNOSIS — H9201 Otalgia, right ear: Secondary | ICD-10-CM | POA: Diagnosis not present

## 2023-01-25 MED ORDER — AMOXICILLIN-POT CLAVULANATE 875-125 MG PO TABS: 1.0000 | ORAL_TABLET | Freq: Two times a day (BID) | ORAL | 0 refills | Status: DC

## 2023-01-25 MED ORDER — NEOMYCIN-POLYMYXIN-HC 3.5-10000-1 OT SUSP: 4.0000 [drp] | Freq: Three times a day (TID) | OTIC | 0 refills | Status: DC

## 2023-01-25 MED ORDER — HYDROCODONE-ACETAMINOPHEN 7.5-325 MG PO TABS: 1.0000 | ORAL_TABLET | Freq: Four times a day (QID) | ORAL | 0 refills | Status: DC | PRN

## 2023-01-25 NOTE — Discharge Instructions (Signed)
Take the antibiotic 2 times a day. Consider a probiotic while on this antibiotic Use the eardrops 4 times a day while awake Take Tylenol for moderate pain.  Take hydrocodone if needed for severe pain.  Do not drive on hydrocodone See your doctor if not improving by next week

## 2023-01-25 NOTE — ED Provider Notes (Signed)
[facility]    CSN: [id number] Arrival date & time: 01/25/23  1342      History   Chief Complaint Chief Complaint  Patient presents with   Ear Pain    HPI Leslie Owen is a 42 y.o. female.   HPI  Worsening ear pain on the right side for 3 days Hearing is decreased Whole side of face hurts No cold symptoms  Past Medical History:  Diagnosis Date   Anemia    Encounter for diagnostic endoscopy    Gall bladder stones    Migraine    Pulmonary emboli (HCC)    Pulmonary embolus (HCC)    Ulcer    Uterine fibroids affecting pregnancy     Patient Active Problem List   Diagnosis Date Noted   Ovarian cyst, left 02/17/2015   Breast pain, left 01/22/2015   Female pelvic pain 01/22/2015   Pulmonary embolism (HCC) 10/02/2014   FIBROIDS, UTERUS 05/30/2007   ENDOMETRIOSIS 05/30/2007    Past Surgical History:  Procedure Laterality Date   ABLATION     ABLATION     BLADDER SUSPENSION     CESAREAN SECTION     CHOLECYSTECTOMY     DIAGNOSTIC LAPAROSCOPY     laporoscopy     MYOMECTOMY     ovarian cysts     PELVIC LAPAROSCOPY     TOTAL ABDOMINAL HYSTERECTOMY     TUBAL LIGATION     UPPER GI ENDOSCOPY     WISDOM TOOTH EXTRACTION      OB History     Gravida  3   Para  3   Term  1   Preterm  2   AB  0   Living  3      SAB  0   IAB  0   Ectopic  0   Multiple      Live Births               Home Medications    Prior to Admission medications   Medication Sig Start Date End Date Taking? Authorizing Provider  amoxicillin-clavulanate (AUGMENTIN) 875-125 MG tablet Take 1 tablet by mouth every 12 (twelve) hours. 01/25/23  Yes Eustace Moore, MD  HYDROcodone-acetaminophen (NORCO) 7.5-325 MG tablet Take 1 tablet by mouth every 6 (six) hours as needed for moderate pain. 01/25/23  Yes Eustace Moore, MD  neomycin-polymyxin-hydrocortisone (CORTISPORIN) 3.5-10000-1 OTIC suspension Place 4 drops into the right ear 3 (three) times daily.  01/25/23  Yes Eustace Moore, MD  ELDERBERRY PO Take by mouth.    [provider]  Multiple Vitamin (MULTIVITAMIN PO) Take by mouth.    [provider]  rizatriptan (MAXALT) 10 MG tablet Take by mouth. 12/27/22   [provider]  terconazole (TERAZOL 3) 0.8 % vaginal cream Place 1 applicator vaginally at bedtime. 01/05/23   Chrzanowski, Lamona Curl, NP    Family History Family History  Problem Relation Age of Onset   Hypertension Mother    Thyroid disease Mother    Arthritis Mother    Diabetes Mother    Cancer Father        Lung   Heart disease Father    Heart attack Father    Arthritis Father    Healthy Brother    Cancer Maternal Aunt        leukemia    Lupus Maternal Aunt    Fibromyalgia Maternal Aunt    Breast cancer Maternal Aunt    Lupus Maternal  Aunt    Cancer Maternal Aunt        lung ca   Cancer Maternal Grandfather 60       lung cancer     Social History Social History   Tobacco Use   Smoking status: Never   Smokeless tobacco: Never  Vaping Use   Vaping Use: Never used  Substance Use Topics   Alcohol use: Yes    Comment: rarely   Drug use: Never     Allergies   Levofloxacin   Review of Systems Review of Systems See HPI  Physical Exam Triage Vital Signs ED Triage Vitals  Enc Vitals Group     BP 01/25/23 1406 125/89     Pulse Rate 01/25/23 1406 77     Resp 01/25/23 1406 16     Temp 01/25/23 1406 98.5 F (36.9 C)     Temp Source 01/25/23 1406 Oral     SpO2 01/25/23 1406 98 %     Weight --      Height --      Head Circumference --      Peak Flow --      Pain Score 01/25/23 1408 2     Pain Loc --      Pain Edu? --      Excl. in GC? --    No data found.  Updated Vital Signs BP 125/89 (BP Location: Left Arm)   Pulse 77   Temp 98.5 F (36.9 C) (Oral)   Resp 16   SpO2 98%      Physical Exam Constitutional:      General: She is not in acute distress.    Appearance: She is well-developed.  HENT:     Head:  Normocephalic and atraumatic.     Left Ear: Tympanic membrane, ear canal and external ear normal.     Ears:     Comments: Right canal swollen and macerated with yellow dc Eyes:     Conjunctiva/sclera: Conjunctivae normal.     Pupils: Pupils are equal, round, and reactive to light.  Cardiovascular:     Rate and Rhythm: Normal rate.  Pulmonary:     Effort: Pulmonary effort is normal. No respiratory distress.  Abdominal:     General: There is no distension.     Palpations: Abdomen is soft.  Musculoskeletal:        General: Normal range of motion.     Cervical back: Normal range of motion.  Skin:    General: Skin is warm and dry.  Neurological:     Mental Status: She is alert.      UC Treatments / Results  Labs (all labs ordered are listed, but only abnormal results are displayed) Labs Reviewed - No data to display  EKG   Radiology No results found.  Procedures Procedures (including critical care time)  Medications Ordered in UC Medications - No data to display  Initial Impression / Assessment and Plan / UC Course  I have reviewed the triage vital signs and the nursing notes.  Pertinent labs & imaging results that were available during my care of the patient were reviewed by me and considered in my medical decision making (see chart for details).      Final Clinical Impressions(s) / UC Diagnoses   Final diagnoses:  Acute infective otitis externa, right  Otalgia, right ear     Discharge Instructions      Take the antibiotic 2 times a day. Consider a probiotic while on  this antibiotic Use the eardrops 4 times a day while awake Take Tylenol for moderate pain.  Take hydrocodone if needed for severe pain.  Do not drive on hydrocodone See your doctor if not improving by next week   ED Prescriptions     Medication Sig Dispense Auth. Provider   amoxicillin-clavulanate (AUGMENTIN) 875-125 MG tablet Take 1 tablet by mouth every 12 (twelve) hours. 14 tablet  Eustace Moore, MD   neomycin-polymyxin-hydrocortisone (CORTISPORIN) 3.5-10000-1 OTIC suspension Place 4 drops into the right ear 3 (three) times daily. 10 mL Eustace Moore, MD   HYDROcodone-acetaminophen [facility]) 7.5-325 MG tablet Take 1 tablet by mouth every 6 (six) hours as needed for moderate pain. 10 tablet Eustace Moore, MD      I have reviewed the PDMP during this encounter.   Eustace Moore, MD 01/25/23 [phone]

## 2023-01-25 NOTE — ED Triage Notes (Signed)
Pt presents for right ear pain x 3 days.

## 2023-02-01 ENCOUNTER — Ambulatory Visit: Payer: BC Managed Care – PPO | Admitting: Physical Therapy

## 2023-02-07 ENCOUNTER — Ambulatory Visit: Payer: BC Managed Care – PPO | Admitting: Physical Therapy

## 2023-02-14 ENCOUNTER — Ambulatory Visit: Payer: BC Managed Care – PPO | Admitting: Physical Therapy

## 2023-02-21 ENCOUNTER — Telehealth: Payer: Self-pay | Admitting: Physical Therapy

## 2023-02-21 ENCOUNTER — Ambulatory Visit: Payer: BC Managed Care – PPO | Attending: Radiology | Admitting: Physical Therapy

## 2023-02-21 DIAGNOSIS — R279 Unspecified lack of coordination: Secondary | ICD-10-CM | POA: Insufficient documentation

## 2023-02-21 DIAGNOSIS — R293 Abnormal posture: Secondary | ICD-10-CM | POA: Insufficient documentation

## 2023-02-21 DIAGNOSIS — M6281 Muscle weakness (generalized): Secondary | ICD-10-CM | POA: Insufficient documentation

## 2023-02-21 NOTE — Telephone Encounter (Signed)
PT called pt about this morning's appointment at 0800. Pt did not answer, voicemail left.   Otelia Sergeant, PT, DPT 07/29/248:22 AM

## 2023-02-28 ENCOUNTER — Ambulatory Visit: Payer: BC Managed Care – PPO | Admitting: Physical Therapy

## 2023-03-07 ENCOUNTER — Encounter: Payer: BC Managed Care – PPO | Admitting: Physical Therapy

## 2023-03-14 ENCOUNTER — Encounter: Payer: BC Managed Care – PPO | Admitting: Physical Therapy

## 2023-03-22 ENCOUNTER — Encounter: Payer: BC Managed Care – PPO | Admitting: Physical Therapy

## 2023-03-27 DIAGNOSIS — Z419 Encounter for procedure for purposes other than remedying health state, unspecified: Secondary | ICD-10-CM | POA: Diagnosis not present

## 2023-03-29 ENCOUNTER — Encounter: Payer: BC Managed Care – PPO | Admitting: Physical Therapy

## 2023-04-26 DIAGNOSIS — Z419 Encounter for procedure for purposes other than remedying health state, unspecified: Secondary | ICD-10-CM | POA: Diagnosis not present

## 2023-05-27 DIAGNOSIS — Z419 Encounter for procedure for purposes other than remedying health state, unspecified: Secondary | ICD-10-CM | POA: Diagnosis not present

## 2023-06-26 DIAGNOSIS — Z419 Encounter for procedure for purposes other than remedying health state, unspecified: Secondary | ICD-10-CM | POA: Diagnosis not present

## 2023-07-12 ENCOUNTER — Ambulatory Visit (INDEPENDENT_AMBULATORY_CARE_PROVIDER_SITE_OTHER): Payer: Medicaid Other | Admitting: Medical-Surgical

## 2023-07-12 ENCOUNTER — Encounter: Payer: Self-pay | Admitting: Medical-Surgical

## 2023-07-12 ENCOUNTER — Ambulatory Visit: Payer: Medicaid Other

## 2023-07-12 VITALS — BP 109/74 | HR 100 | Resp 20 | Ht 63.0 in | Wt 170.3 lb

## 2023-07-12 DIAGNOSIS — R109 Unspecified abdominal pain: Secondary | ICD-10-CM

## 2023-07-12 NOTE — Progress Notes (Signed)
        Established patient visit  History, exam, impression, and plan:  1. Right lateral abdominal pain (Primary) Pleasant 42 year old female presenting today with reports of 3 days of sharp constant right lateral abdominal pain rated 5/10.  No alleviating or aggravating factors noted.  She has been having diarrhea with liquid stools, no melena or hematochezia.  No nausea or vomiting.  History of cholecystectomy and partial hysterectomy.  She does still have her ovaries.  No history of kidney stones.  No urinary symptoms.  Feels like she has a knot in the right side of her abdomen.  She does have a history of concerns for possible liver enlargement that was evaluated with ultrasound.  On exam, she has bowel sounds that are normal on the left upper and left lower quadrant.  She has hypoactive bowel sounds on the right lower and right upper quadrants.  In the area indicated on the right lateral abdomen she does have a bit of swelling that is visible when lying supine.  The area is soft and reducible but very tender to palpation.  No other areas of tenderness noted throughout the abdomen.  Plan for labs as noted below to evaluate kidney function, liver function, electrolytes, and white blood cells.  Adding inflammatory markers.  Getting an abdominal x-ray today with a request for stat read to determine if CT abdomen is necessary.  Holding off on medications at this time but once imaging results are available, we will treat accordingly.  Suspect this is a fecal impaction given the liquid nature of her stools.  If this is the case, we will have her do a colon cleanse. - DG Abd 1 View; Future - CBC with Differential/Platelet - CMP14+EGFR - Sed Rate (ESR) - C-reactive protein  Procedures performed this visit: None.  Return if symptoms worsen or fail to improve.  __________________________________ Thayer Ohm, DNP, APRN, FNP-BC Primary Care and Sports Medicine [facility] [place]

## 2023-07-13 LAB — CMP14+EGFR
ALT: 10 [IU]/L (ref 0–32)
AST: 14 [IU]/L (ref 0–40)
Albumin: 4.3 g/dL (ref 3.9–4.9)
Alkaline Phosphatase: 57 [IU]/L (ref 44–121)
BUN/Creatinine Ratio: 5 — ABNORMAL LOW (ref 9–23)
BUN: 5 mg/dL — ABNORMAL LOW (ref 6–24)
Bilirubin Total: 0.5 mg/dL (ref 0.0–1.2)
CO2: 21 mmol/L (ref 20–29)
Calcium: 9.8 mg/dL (ref 8.7–10.2)
Chloride: 105 mmol/L (ref 96–106)
Creatinine, Ser: 1.05 mg/dL — ABNORMAL HIGH (ref 0.57–1.00)
Globulin, Total: 2.3 g/dL (ref 1.5–4.5)
Glucose: 107 mg/dL — ABNORMAL HIGH (ref 70–99)
Potassium: 3.9 mmol/L (ref 3.5–5.2)
Sodium: 140 mmol/L (ref 134–144)
Total Protein: 6.6 g/dL (ref 6.0–8.5)
eGFR: 69 mL/min/{1.73_m2} (ref >59)

## 2023-07-13 LAB — CBC WITH DIFFERENTIAL/PLATELET
Basophils Absolute: 0 10*3/uL (ref 0.0–0.2)
Basos: 0 %
EOS (ABSOLUTE): 0.1 10*3/uL (ref 0.0–0.4)
Eos: 1 %
Hematocrit: 40.7 % (ref 34.0–46.6)
Hemoglobin: 12.9 g/dL (ref 11.1–15.9)
Immature Grans (Abs): 0 10*3/uL (ref 0.0–0.1)
Immature Granulocytes: 0 %
Lymphocytes Absolute: 1.9 10*3/uL (ref 0.7–3.1)
Lymphs: 39 %
MCH: 30.3 pg (ref 26.6–33.0)
MCHC: 31.7 g/dL (ref 31.5–35.7)
MCV: 96 fL (ref 79–97)
Monocytes Absolute: 0.4 10*3/uL (ref 0.1–0.9)
Monocytes: 8 %
Neutrophils Absolute: 2.5 10*3/uL (ref 1.4–7.0)
Neutrophils: 52 %
Platelets: 314 10*3/uL (ref 150–450)
RBC: 4.26 x10E6/uL (ref 3.77–5.28)
RDW: 12.7 % (ref 11.7–15.4)
WBC: 4.9 10*3/uL (ref 3.4–10.8)

## 2023-07-13 LAB — SEDIMENTATION RATE: Sed Rate: 5 mm/h (ref 0–32)

## 2023-07-13 LAB — C-REACTIVE PROTEIN: CRP: <1 mg/L (ref 0–10)

## 2023-07-27 DIAGNOSIS — Z419 Encounter for procedure for purposes other than remedying health state, unspecified: Secondary | ICD-10-CM | POA: Diagnosis not present

## 2023-08-27 DIAGNOSIS — Z419 Encounter for procedure for purposes other than remedying health state, unspecified: Secondary | ICD-10-CM | POA: Diagnosis not present

## 2023-09-01 ENCOUNTER — Ambulatory Visit
Admission: RE | Admit: 2023-09-01 | Discharge: 2023-09-01 | Disposition: A | Discharge status: Home / Self Care | Payer: Medicaid Other | Source: Ambulatory Visit | Attending: Family Medicine | Admitting: Family Medicine

## 2023-09-01 VITALS — BP 117/80 | HR 78 | Temp 98.4°F | Resp 18 | Ht 63.0 in | Wt 170.0 lb

## 2023-09-01 DIAGNOSIS — J069 Acute upper respiratory infection, unspecified: Secondary | ICD-10-CM

## 2023-09-01 LAB — POCT INFLUENZA A/B
Influenza A, POC: NEGATIVE
Influenza B, POC: NEGATIVE

## 2023-09-01 LAB — POC SARS CORONAVIRUS 2 AG -  ED: SARS Coronavirus 2 Ag: NEGATIVE

## 2023-09-01 NOTE — ED Provider Notes (Signed)
 [facility]    CSN: [id number] Arrival date & time: 09/01/23  0857      History   Chief Complaint Chief Complaint  Patient presents with   Cough    Chills, fever, runny nose - Entered by patient    HPI Leslie Owen is a 43 y.o. female.   Patient's had fever and chills, runny nose and mild cough.  Symptoms started yesterday.  Kids at home are starting to get sick.  Concern for flu.  Needs a work note    Past Medical History:  Diagnosis Date   Anemia    Encounter for diagnostic endoscopy    Gall bladder stones    Migraine    Pulmonary emboli (HCC)    Pulmonary embolus (HCC)    Ulcer    Uterine fibroids affecting pregnancy     Patient Active Problem List   Diagnosis Date Noted   Ovarian cyst, left 02/17/2015   Breast pain, left 01/22/2015   Female pelvic pain 01/22/2015   Pulmonary embolism (HCC) 10/02/2014   FIBROIDS, UTERUS 05/30/2007   Endometriosis 05/30/2007    Past Surgical History:  Procedure Laterality Date   ABLATION     ABLATION     BLADDER SUSPENSION     CESAREAN SECTION     CHOLECYSTECTOMY     DIAGNOSTIC LAPAROSCOPY     laporoscopy     MYOMECTOMY     ovarian cysts     PELVIC LAPAROSCOPY     TOTAL ABDOMINAL HYSTERECTOMY     TUBAL LIGATION     UPPER GI ENDOSCOPY     WISDOM TOOTH EXTRACTION      OB History     Gravida  3   Para  3   Term  1   Preterm  2   AB  0   Living  3      SAB  0   IAB  0   Ectopic  0   Multiple      Live Births               Home Medications    Prior to Admission medications   Medication Sig Start Date End Date Taking? Authorizing Provider  ELDERBERRY PO Take by mouth.   Yes [provider]  rizatriptan (MAXALT) 10 MG tablet Take by mouth. 12/27/22  Yes [provider]    Family History Family History  Problem Relation Age of Onset   Hypertension Mother    Thyroid disease Mother    Arthritis Mother    Diabetes Mother    Cancer Father        Lung    Heart disease Father    Heart attack Father    Arthritis Father    Healthy Brother    Cancer Maternal Aunt        leukemia    Lupus Maternal Aunt    Fibromyalgia Maternal Aunt    Breast cancer Maternal Aunt    Lupus Maternal Aunt    Cancer Maternal Aunt        lung ca   Cancer Maternal Grandfather 60       lung cancer     Social History Social History   Tobacco Use   Smoking status: Never   Smokeless tobacco: Never  Vaping Use   Vaping status: Never Used  Substance Use Topics   Alcohol use: Yes    Comment: rarely   Drug use: Never     Allergies  Levofloxacin   Review of Systems Review of Systems See HPI  Physical Exam Triage Vital Signs ED Triage Vitals  Encounter Vitals Group     BP 09/01/23 0911 117/80     Systolic BP Percentile --      Diastolic BP Percentile --      Pulse Rate 09/01/23 0911 78     Resp 09/01/23 0911 18     Temp 09/01/23 0911 98.4 F (36.9 C)     Temp Source 09/01/23 0911 Oral     SpO2 09/01/23 0911 98 %     Weight 09/01/23 0912 170 lb (77.1 kg)     Height 09/01/23 0912 5' 3 (1.6 m)     Head Circumference --      Peak Flow --      Pain Score 09/01/23 0912 0     Pain Loc --      Pain Education --      Exclude from Growth Chart --    No data found.  Updated Vital Signs BP 117/80 (BP Location: Right Arm)   Pulse 78   Temp 98.4 F (36.9 C) (Oral)   Resp 18   Ht 5' 3 (1.6 m)   Wt 77.1 kg   SpO2 98%   BMI 30.11 kg/m      Physical Exam Constitutional:      General: She is not in acute distress.    Appearance: She is well-developed. She is ill-appearing.  HENT:     Head: Normocephalic and atraumatic.     Nose: Congestion and rhinorrhea present.     Mouth/Throat:     Pharynx: No posterior oropharyngeal erythema.  Eyes:     Conjunctiva/sclera: Conjunctivae normal.     Pupils: Pupils are equal, round, and reactive to light.  Cardiovascular:     Rate and Rhythm: Normal rate.     Heart sounds: Normal heart sounds.   Pulmonary:     Effort: Pulmonary effort is normal. No respiratory distress.     Breath sounds: Normal breath sounds.  Abdominal:     General: There is no distension.     Palpations: Abdomen is soft.  Musculoskeletal:        General: Normal range of motion.     Cervical back: Normal range of motion.  Lymphadenopathy:     Cervical: Cervical adenopathy present.  Skin:    General: Skin is warm and dry.  Neurological:     Mental Status: She is alert.      UC Treatments / Results  Labs (all labs ordered are listed, but only abnormal results are displayed) Labs Reviewed  POCT INFLUENZA A/B - Normal  POC SARS CORONAVIRUS 2 AG -  ED    EKG   Radiology No results found.  Procedures Procedures (including critical care time)  Medications Ordered in UC Medications - No data to display  Initial Impression / Assessment and Plan / UC Course  I have reviewed the triage vital signs and the nursing notes.  Pertinent labs & imaging results that were available during my care of the patient were reviewed by me and considered in my medical decision making (see chart for details).     Flu test negative.  Upper respiratory infection likely viral.  Discussed home treatment Final Clinical Impressions(s) / UC Diagnoses   Final diagnoses:  Acute upper respiratory infection     Discharge Instructions      Drink lots of fluid May take over-the-counter cough or cold medicines Call for  problems   ED Prescriptions   None    PDMP not reviewed this encounter.   Maranda Jamee Jacob, MD 09/01/23 [phone]

## 2023-09-01 NOTE — ED Triage Notes (Signed)
 Patient c/o cough, congestion, nasal drainage, fever since yesterday.  Patient has taken Nyquil.

## 2023-09-01 NOTE — Discharge Instructions (Signed)
 Drink lots of fluid May take over-the-counter cough or cold medicines Call for problems

## 2023-09-24 DIAGNOSIS — Z419 Encounter for procedure for purposes other than remedying health state, unspecified: Secondary | ICD-10-CM | POA: Diagnosis not present

## 2023-11-05 DIAGNOSIS — Z419 Encounter for procedure for purposes other than remedying health state, unspecified: Secondary | ICD-10-CM | POA: Diagnosis not present

## 2023-11-25 ENCOUNTER — Other Ambulatory Visit: Payer: Self-pay | Admitting: Nurse Practitioner

## 2023-11-25 DIAGNOSIS — Z1231 Encounter for screening mammogram for malignant neoplasm of breast: Secondary | ICD-10-CM

## 2023-11-30 ENCOUNTER — Encounter (HOSPITAL_COMMUNITY): Payer: Self-pay

## 2023-12-05 DIAGNOSIS — Z419 Encounter for procedure for purposes other than remedying health state, unspecified: Secondary | ICD-10-CM | POA: Diagnosis not present

## 2023-12-07 ENCOUNTER — Ambulatory Visit
Admission: RE | Admit: 2023-12-07 | Discharge: 2023-12-07 | Disposition: A | Discharge status: Home / Self Care | Source: Ambulatory Visit | Attending: Nurse Practitioner

## 2023-12-07 DIAGNOSIS — Z1231 Encounter for screening mammogram for malignant neoplasm of breast: Secondary | ICD-10-CM | POA: Diagnosis not present

## 2024-01-05 DIAGNOSIS — Z419 Encounter for procedure for purposes other than remedying health state, unspecified: Secondary | ICD-10-CM | POA: Diagnosis not present

## 2024-02-04 DIAGNOSIS — Z419 Encounter for procedure for purposes other than remedying health state, unspecified: Secondary | ICD-10-CM | POA: Diagnosis not present

## 2024-03-06 DIAGNOSIS — Z419 Encounter for procedure for purposes other than remedying health state, unspecified: Secondary | ICD-10-CM | POA: Diagnosis not present

## 2024-04-06 DIAGNOSIS — Z419 Encounter for procedure for purposes other than remedying health state, unspecified: Secondary | ICD-10-CM | POA: Diagnosis not present

## 2024-04-27 ENCOUNTER — Ambulatory Visit: Payer: Self-pay

## 2024-04-27 DIAGNOSIS — G35D Multiple sclerosis, unspecified: Secondary | ICD-10-CM | POA: Diagnosis not present

## 2024-04-27 DIAGNOSIS — R200 Anesthesia of skin: Secondary | ICD-10-CM | POA: Diagnosis not present

## 2024-04-27 DIAGNOSIS — R29898 Other symptoms and signs involving the musculoskeletal system: Secondary | ICD-10-CM | POA: Diagnosis not present

## 2024-04-27 DIAGNOSIS — R519 Headache, unspecified: Secondary | ICD-10-CM | POA: Diagnosis not present

## 2024-04-27 DIAGNOSIS — R9082 White matter disease, unspecified: Secondary | ICD-10-CM | POA: Diagnosis not present

## 2024-04-27 DIAGNOSIS — R29810 Facial weakness: Secondary | ICD-10-CM | POA: Diagnosis not present

## 2024-04-27 DIAGNOSIS — R531 Weakness: Secondary | ICD-10-CM | POA: Diagnosis not present

## 2024-04-27 NOTE — Telephone Encounter (Signed)
 FYI Only or Action Required?: FYI only for provider.  Patient was last seen in primary care on 07/12/2023 by Leslie Mini, NP.  Called Nurse Triage reporting Numbness.  Symptoms began several days ago.  Interventions attempted: Nothing.  Symptoms are: unchanged.  Triage Disposition: Call EMS 911 Now  Patient/caregiver understands and will follow disposition?: Unsure        Copied from CRM [id number]. Topic: Clinical - Red Word Triage >> Apr 27, 2024  2:08 PM [name] wrote: Red Word that prompted transfer to Nurse Triage: left side of face and arm and finger tips are tingling and numb.    ----------------------------------------------------------------------- From previous Reason for Contact - Scheduling: Patient/patient representative is calling to schedule an appointment. Refer to attachments for appointment information. Reason for Disposition  [1] Numbness (i.e., loss of sensation) of the face, arm / hand, or leg / foot on one side of the body AND [2] sudden onset AND [3] present now  Answer Assessment - Initial Assessment Questions 1. SYMPTOM: What is the main symptom you are concerned about? (e.g., weakness, numbness)     Numbness/tingling on L side of face 2. ONSET: When did this start? (e.g., minutes, hours, days; while sleeping)     Sunday 3. LAST NORMAL: When was the last time you (the patient) were normal (no symptoms)?     Saturday 4. PATTERN Does this come and go, or has it been constant since it started?  Is it present now?     Constant since Sunday  5. CARDIAC SYMPTOMS: Have you had any of the following symptoms: chest pain, difficulty breathing, palpitations?     denies 6. NEUROLOGIC SYMPTOMS: Have you had any of the following symptoms: headache, dizziness, vision loss, double vision, changes in speech, unsteady on your feet?     Endorses chronic migraine. Some nausea, denies other sx 7. OTHER SYMPTOMS: Do you have any other symptoms?      Nausea. 8. PREGNANCY: Is there any chance you are pregnant? When was your last menstrual period?     N.a  Protocols used: Neurologic Deficit-A-AH

## 2024-04-30 DIAGNOSIS — R200 Anesthesia of skin: Secondary | ICD-10-CM | POA: Diagnosis not present

## 2024-04-30 DIAGNOSIS — G379 Demyelinating disease of central nervous system, unspecified: Secondary | ICD-10-CM | POA: Diagnosis not present

## 2024-04-30 DIAGNOSIS — R0602 Shortness of breath: Secondary | ICD-10-CM | POA: Diagnosis not present

## 2024-04-30 DIAGNOSIS — Z86711 Personal history of pulmonary embolism: Secondary | ICD-10-CM | POA: Diagnosis not present

## 2024-04-30 DIAGNOSIS — G35D Multiple sclerosis, unspecified: Secondary | ICD-10-CM | POA: Diagnosis not present

## 2024-05-11 ENCOUNTER — Encounter: Payer: Self-pay | Admitting: Medical-Surgical

## 2024-05-11 ENCOUNTER — Ambulatory Visit (INDEPENDENT_AMBULATORY_CARE_PROVIDER_SITE_OTHER): Admitting: Medical-Surgical

## 2024-05-11 VITALS — BP 109/67 | HR 84 | Resp 20 | Ht 63.0 in | Wt 164.0 lb

## 2024-05-11 DIAGNOSIS — Z09 Encounter for follow-up examination after completed treatment for conditions other than malignant neoplasm: Secondary | ICD-10-CM | POA: Diagnosis not present

## 2024-05-11 DIAGNOSIS — G35D Multiple sclerosis, unspecified: Secondary | ICD-10-CM | POA: Diagnosis not present

## 2024-05-11 MED ORDER — MECLIZINE HCL 25 MG PO TABS: 25.0000 mg | ORAL_TABLET | Freq: Three times a day (TID) | ORAL | 1 refills | Status: AC | PRN

## 2024-05-11 NOTE — Progress Notes (Signed)
   Established Patient Office Visit  Subjective   Patient ID: Leslie Owen, female    DOB: 1981-03-02  Age: 43 y.o. MRN: [id number]  Chief Complaint  Patient presents with   Hospitalization Follow-up    HPI  43 year old female presents to the office today for follow up hospital admission. Patient was diagnosed with MS. She continues to have numbness in her left face, left fingers, dizziness, and feeling off balance. Denies having any falls. Patient was not started on any medication for symptoms. Patient has a follow up with Neuro on 10/24. Denies family history of MS.  Review of Systems  Constitutional: Negative.   HENT: Negative.    Eyes: Negative.   Respiratory: Negative.    Cardiovascular: Negative.   Gastrointestinal: Negative.   Genitourinary: Negative.   Musculoskeletal: Negative.   Skin: Negative.   Neurological:  Positive for dizziness and tremors (Numbness in left face and left hand).  Endo/Heme/Allergies: Negative.   Psychiatric/Behavioral: Negative.        Objective:     BP 109/67 (BP Location: Left Arm, Cuff Size: Normal)   Pulse 84   Resp 20   Ht 5' 3 (1.6 m)   Wt 74.4 kg   SpO2 98%   BMI 29.05 kg/m    Physical Exam Vitals and nursing note reviewed.  Constitutional:      General: She is not in acute distress.    Appearance: Normal appearance. She is normal weight.  HENT:     Head: Normocephalic.  Cardiovascular:     Rate and Rhythm: Normal rate and regular rhythm.     Pulses: Normal pulses.     Heart sounds: Normal heart sounds.  Pulmonary:     Effort: Pulmonary effort is normal.     Breath sounds: Normal breath sounds.  Neurological:     Mental Status: She is alert and oriented to person, place, and time.     Cranial Nerves: Facial asymmetry present.     Sensory: Sensory deficit (in left cheek) present.    No results found for any visits on 05/11/24.   The 10-year ASCVD risk score (Arnett DK, et al., 2019) is: 0.1%    Assessment  & Plan:  1. Hospital discharge follow-up (Primary) -Recent hospitalization from 10/6-10/10 -Diagnosis of Multiple Sclerosis -Continued dizziness, numbness, and feeling off balance  2. Multiple sclerosis -Continued dizziness, numbness, and feeling off balance  -Follow up with Neurology on 10/24 -Rx for Meclizine sent to the pharmacy for dizziness  Derrek JINNY Freund, NP Student

## 2024-05-11 NOTE — Progress Notes (Signed)
 Due to dizziness, patient is unable to drive at this time.  She has a commute to work and her job has her standing all day.  Letter provided to patient for her absence from work from 04/30/2024 through 05/25/2024.  Requested forms be sent to our office for completion if needed.  Medical screening examination/treatment was performed by qualified clinical staff member and as supervising provider I was immediately available for consultation/collaboration. I have reviewed documentation and agree with assessment and plan.  Zada FREDRIK Palin, DNP, APRN, FNP-BC [facility] and Sports Medicine

## 2024-05-15 DIAGNOSIS — G379 Demyelinating disease of central nervous system, unspecified: Secondary | ICD-10-CM | POA: Diagnosis not present

## 2024-05-15 DIAGNOSIS — G35D Multiple sclerosis, unspecified: Secondary | ICD-10-CM | POA: Diagnosis not present

## 2024-05-15 DIAGNOSIS — Z1331 Encounter for screening for depression: Secondary | ICD-10-CM | POA: Diagnosis not present

## 2024-05-16 DIAGNOSIS — G35D Multiple sclerosis, unspecified: Secondary | ICD-10-CM | POA: Insufficient documentation

## 2024-05-17 ENCOUNTER — Encounter: Payer: Self-pay | Admitting: Medical-Surgical

## 2024-05-18 ENCOUNTER — Encounter: Payer: Self-pay | Admitting: Medical-Surgical

## 2024-05-18 ENCOUNTER — Ambulatory Visit (INDEPENDENT_AMBULATORY_CARE_PROVIDER_SITE_OTHER): Admitting: Medical-Surgical

## 2024-05-18 VITALS — BP 96/64 | HR 88 | Ht 63.0 in | Wt 165.0 lb

## 2024-05-18 DIAGNOSIS — G35A Relapsing-remitting multiple sclerosis: Secondary | ICD-10-CM

## 2024-05-18 NOTE — Telephone Encounter (Signed)
 Printed pages 11 - 21 for the patient to complete and add to the pages that you have already completed. Gave to the patient at her appointment today to complete her portion.

## 2024-05-18 NOTE — Progress Notes (Signed)
 Medical screening examination/treatment was performed by qualified clinical staff member and as supervising provider I was immediately available for consultation/collaboration. I have reviewed documentation and agree with assessment and plan.  Thayer Ohm, DNP, APRN, FNP-BC [facility] and Sports Medicine

## 2024-05-18 NOTE — Progress Notes (Signed)
   Established Patient Office Visit  Subjective   Patient ID: Leslie Owen, female    DOB: 1980-09-09  Age: 43 y.o. MRN: [id number]  Chief Complaint  Patient presents with   Discuss FMLA    HPI  Patient presents to discuss FMLA paperwork following recent hospitalization and diagnosis of Multiple Sclerosis. Patient had hospital follow up in office on 10/17 and was started on Meclizine 25 mg for dizziness. Patient states that symptoms has improved, but is noticing drowsiness. She would like to cinsider decreasing the dose if possible. Had first appointment with neurology on 10/21 with [facility]. Per Neuro diagnosis is supportive of relapsing-remitting multiple sclerosis. Current plan discussed by neuro is to start Ocrevus and Tysabri, physical therapy, and follow up in 1 month on 11/13. Patient has not yet started on medication pending return of lab results.   FMLA needs include patient would like to have intermittent days off, but would like to start this intermittent leave after November 1st. She would also like to be able to sit during her shift instead of standing her whole shift. Discussed work needs with neuro, but no recommendations from neurologist at this time.  Review of Systems  Constitutional:  Positive for malaise/fatigue.  HENT: Negative.    Eyes: Negative.   Respiratory: Negative.    Cardiovascular: Negative.   Gastrointestinal: Negative.   Genitourinary: Negative.   Musculoskeletal: Negative.   Skin: Negative.   Neurological: Negative.   Endo/Heme/Allergies: Negative.   Psychiatric/Behavioral: Negative.        Objective:     There were no vitals taken for this visit. BP Readings from Last 3 Encounters:  05/11/24 109/67  09/01/23 117/80  07/12/23 109/74   Wt Readings from Last 3 Encounters:  05/11/24 74.4 kg  09/01/23 77.1 kg  07/12/23 77.2 kg      Physical Exam Vitals and nursing note reviewed.  Constitutional:      General: She is not in acute  distress.    Appearance: Normal appearance.  HENT:     Head: Normocephalic.  Cardiovascular:     Rate and Rhythm: Normal rate and regular rhythm.     Pulses: Normal pulses.     Heart sounds: Normal heart sounds.  Pulmonary:     Effort: Pulmonary effort is normal.     Breath sounds: Normal breath sounds.  Neurological:     General: No focal deficit present.     Mental Status: She is alert and oriented to person, place, and time.  Psychiatric:        Mood and Affect: Mood normal.        Behavior: Behavior normal.        Thought Content: Thought content normal.        Judgment: Judgment normal.    No results found for any visits on 05/18/24.   The 10-year ASCVD risk score (Arnett DK, et al., 2019) is: 0.3%    Assessment & Plan:   1. Relapsing-remitting multiple sclerosis (Primary) -FMLA paperwork completed to allow patient to take intermittent days off and allow sitting during work shift. -Intermittent FMLA 1-3 times per month for 1-3 days each -Patient is taking 25 mg Meclizine for dizziness, but reports fatigue -Recommend breaking 25 mg tablet in half for 12.5 mg dose -Follow up with Neuro on 11/13  Derrek JINNY Freund, NP Student

## 2024-06-12 DIAGNOSIS — R531 Weakness: Secondary | ICD-10-CM | POA: Diagnosis not present

## 2024-07-02 DIAGNOSIS — G35D Multiple sclerosis, unspecified: Secondary | ICD-10-CM | POA: Diagnosis not present

## 2024-07-04 DIAGNOSIS — R531 Weakness: Secondary | ICD-10-CM | POA: Diagnosis not present

## 2024-07-10 DIAGNOSIS — R531 Weakness: Secondary | ICD-10-CM | POA: Diagnosis not present

## 2024-07-17 DIAGNOSIS — R531 Weakness: Secondary | ICD-10-CM | POA: Diagnosis not present

## 2024-08-09 ENCOUNTER — Ambulatory Visit
Admission: RE | Admit: 2024-08-09 | Discharge: 2024-08-09 | Disposition: A | Discharge status: Home / Self Care | Attending: Family Medicine | Admitting: Family Medicine

## 2024-08-09 ENCOUNTER — Ambulatory Visit

## 2024-08-09 VITALS — BP 113/75 | HR 80 | Temp 98.8°F | Resp 16

## 2024-08-09 DIAGNOSIS — M25562 Pain in left knee: Secondary | ICD-10-CM

## 2024-08-09 HISTORY — DX: Multiple sclerosis, unspecified: G35.D

## 2024-08-09 MED ORDER — METHYLPREDNISOLONE 4 MG PO TBPK: ORAL_TABLET | ORAL | 0 refills | Status: AC

## 2024-08-09 MED ORDER — HYDROCODONE-ACETAMINOPHEN 5-325 MG PO TABS: 1.0000 | ORAL_TABLET | Freq: Four times a day (QID) | ORAL | 0 refills | Status: AC | PRN

## 2024-08-09 NOTE — Discharge Instructions (Signed)
 Take the prednisone  as directed.  Take all of day 1 today.  You may take all 6 pills when you get the prescription filled.  Starting tomorrow take according to schedule Ice to area for 20 minutes every couple of hours Limit walking while knee is painful May take hydrocodone  if needed in addition to the prednisone  when pain is severe.  Do not drive on hydrocodone .  This will help you sleep See your doctor if not improving by next week

## 2024-08-09 NOTE — ED Provider Notes (Signed)
 " Leslie Owen    CSN: [id number] Arrival date & time: 08/09/24  1747      History   Chief Complaint Chief Complaint  Patient presents with   Joint Swelling    HPI Leslie Owen is a 44 y.o. female.   HPI  44 year old woman.  She states she is in physical therapy for strength and balance.  She has MS.  She has not done any unusual exercises, has not had any twisting or fall.  Woke up this morning with pain in her left knee.  It is swollen.  Hurts with movement.  Hurts with weightbearing.  Has never had problems like this before.  Past Medical History:  Diagnosis Date   Anemia    Encounter for diagnostic endoscopy    Gall bladder stones    Migraine    MS (multiple sclerosis)    Pulmonary emboli (HCC)    Pulmonary embolus (HCC)    Ulcer    Uterine fibroids affecting pregnancy     Patient Active Problem List   Diagnosis Date Noted   Multiple sclerosis 05/16/2024   Ovarian cyst, left 02/17/2015   Breast pain, left 01/22/2015   Female pelvic pain 01/22/2015   Pulmonary embolism (HCC) 10/02/2014   FIBROIDS, UTERUS 05/30/2007   Endometriosis 05/30/2007    Past Surgical History:  Procedure Laterality Date   ABLATION     ABLATION     BLADDER SUSPENSION     CESAREAN SECTION     CHOLECYSTECTOMY     DIAGNOSTIC LAPAROSCOPY     laporoscopy     MYOMECTOMY     ovarian cysts     PELVIC LAPAROSCOPY     TOTAL ABDOMINAL HYSTERECTOMY     TUBAL LIGATION     UPPER GI ENDOSCOPY     WISDOM TOOTH EXTRACTION      OB History     Gravida  3   Para  3   Term  1   Preterm  2   AB  0   Living  3      SAB  0   IAB  0   Ectopic  0   Multiple      Live Births               Home Medications    Prior to Admission medications  Medication Sig Start Date End Date Taking? Authorizing Provider  HYDROcodone -acetaminophen  (NORCO/VICODIN) 5-325 MG tablet Take 1-2 tablets by mouth every 6 (six) hours as needed. 08/09/24  Yes Maranda Jamee Jacob, MD   methylPREDNISolone  (MEDROL  DOSEPAK) 4 MG TBPK tablet tad 08/09/24  Yes Maranda Jamee Jacob, MD  ELDERBERRY PO Take by mouth.    [provider]  meclizine  (ANTIVERT ) 25 MG tablet Take 1 tablet (25 mg total) by mouth 3 (three) times daily as needed for dizziness. 05/11/24   Willo Mini, NP  rizatriptan (MAXALT) 10 MG tablet Take by mouth. 12/27/22   [provider]  VUMERITY 231 MG CPDR Take 2 capsules by mouth 2 (two) times daily.    [provider]    Family History Family History  Problem Relation Age of Onset   Hypertension Mother    Thyroid disease Mother    Arthritis Mother    Diabetes Mother    Heart disease Father    Heart attack Father    Arthritis Father    Lung cancer Father    Lupus Maternal Aunt    Fibromyalgia Maternal Aunt  Leukemia Maternal Aunt    Breast cancer Maternal Aunt    Lupus Maternal Aunt    Lung cancer Maternal Aunt    Lung cancer Maternal Grandfather 53   Healthy Brother     Social History Social History[1]   Allergies   Levofloxacin   Review of Systems Review of Systems See HPI  Physical Exam Triage Vital Signs ED Triage Vitals [08/09/24 1754]  Encounter Vitals Group     BP 113/75     Girls Systolic BP Percentile      Girls Diastolic BP Percentile      Boys Systolic BP Percentile      Boys Diastolic BP Percentile      Pulse Rate 80     Resp 16     Temp 98.8 F (37.1 C)     Temp Source Oral     SpO2 97 %     Weight      Height      Head Circumference      Peak Flow      Pain Score 6     Pain Loc      Pain Education      Exclude from Growth Chart    No data found.  Updated Vital Signs BP 113/75 (BP Location: Right Arm)   Pulse 80   Temp 98.8 F (37.1 C) (Oral)   Resp 16   SpO2 97%       Physical Exam Constitutional:      General: She is not in acute distress.    Appearance: She is well-developed.  HENT:     Head: Normocephalic and atraumatic.  Eyes:     Conjunctiva/sclera:  Conjunctivae normal.     Pupils: Pupils are equal, round, and reactive to light.  Cardiovascular:     Rate and Rhythm: Normal rate.  Pulmonary:     Effort: Pulmonary effort is normal. No respiratory distress.  Musculoskeletal:        General: Swelling and tenderness present. Normal range of motion.     Cervical back: Normal range of motion.     Comments: Tenderness around patella.  Pain with patellar grind.  Patellofemoral crepitus is present.  Effusion is present.  No joint line tenderness.  No instability lacks full extension and flexion secondary to pain  Skin:    General: Skin is warm and dry.  Neurological:     Mental Status: She is alert.     Gait: Gait abnormal.      UC Treatments / Results  Labs (all labs ordered are listed, but only abnormal results are displayed) Labs Reviewed - No data to display  EKG   Radiology DG Knee AP/LAT W/Sunrise Left Result Date: 08/09/2024 EXAM: 3 VIEW(S) XRAY OF THE KNEE 08/09/2024 06:20:00 PM COMPARISON: None available. CLINICAL HISTORY: anterior knee pain with effusion FINDINGS: BONES AND JOINTS: No acute fracture. No malalignment. No significant joint effusion. No significant degenerative changes. SOFT TISSUES: Unremarkable. IMPRESSION: 1. No acute osseous abnormality. Electronically signed by: Norman Gatlin MD 08/09/2024 06:28 PM EST RP Workstation: [id number]    Procedures Procedures (including critical Owen time)  Medications Ordered in UC Medications - No data to display  Initial Impression / Assessment and Plan / UC Course  I have reviewed the triage vital signs and the nursing notes.  Pertinent labs & imaging results that were available during my Owen of the patient were reviewed by me and considered in my medical decision making (see chart for details).  Normal x-rays discussed with patient Reviewed anterior knee pain Expected improvement with rest, ice, anti-inflammatories Can discuss any additional exercises with  her physical therapist Final Clinical Impressions(s) / UC Diagnoses   Final diagnoses:  Acute pain of left knee  Anterior knee pain, left     Discharge Instructions      Take the prednisone  as directed.  Take all of day 1 today.  You may take all 6 pills when you get the prescription filled.  Starting tomorrow take according to schedule Ice to area for 20 minutes every couple of hours Limit walking while knee is painful May take hydrocodone  if needed in addition to the prednisone  when pain is severe.  Do not drive on hydrocodone .  This will help you sleep See your doctor if not improving by next week     ED Prescriptions     Medication Sig Dispense Auth. Provider   methylPREDNISolone  (MEDROL  DOSEPAK) 4 MG TBPK tablet tad 21 tablet Maranda Jamee Jacob, MD   HYDROcodone -acetaminophen  (NORCO/VICODIN) 5-325 MG tablet Take 1-2 tablets by mouth every 6 (six) hours as needed. 10 tablet Maranda Jamee Jacob, MD      I have reviewed the PDMP during this encounter.     [1]  Social History Tobacco Use   Smoking status: Never   Smokeless tobacco: Never  Vaping Use   Vaping status: Never Used  Substance Use Topics   Alcohol use: Yes    Comment: rarely   Drug use: Never     Maranda Jamee Jacob, MD 08/09/24 1837  "

## 2024-08-09 NOTE — ED Triage Notes (Signed)
 Pt reports she woke up today with left knee pain and swelling. Denies any injury. Tried resting, ice, heat and elevation.
